# Patient Record
Sex: Female | Born: 1965 | Race: White | Hispanic: No | Marital: Married | Smoking: Never smoker
Health system: Southern US, Community
[De-identification: ages and names within clinical notes are randomized; demographics above are authoritative.]

## PROBLEM LIST (undated history)

## (undated) DIAGNOSIS — E509 Vitamin A deficiency, unspecified: Secondary | ICD-10-CM

## (undated) DIAGNOSIS — D509 Iron deficiency anemia, unspecified: Secondary | ICD-10-CM

## (undated) DIAGNOSIS — F329 Major depressive disorder, single episode, unspecified: Secondary | ICD-10-CM

## (undated) DIAGNOSIS — E669 Obesity, unspecified: Secondary | ICD-10-CM

## (undated) DIAGNOSIS — R519 Headache, unspecified: Secondary | ICD-10-CM

## (undated) DIAGNOSIS — D649 Anemia, unspecified: Secondary | ICD-10-CM

## (undated) DIAGNOSIS — I10 Essential (primary) hypertension: Secondary | ICD-10-CM

## (undated) DIAGNOSIS — Z9884 Bariatric surgery status: Secondary | ICD-10-CM

## (undated) DIAGNOSIS — G8929 Other chronic pain: Secondary | ICD-10-CM

## (undated) DIAGNOSIS — F32A Depression, unspecified: Secondary | ICD-10-CM

## (undated) DIAGNOSIS — E211 Secondary hyperparathyroidism, not elsewhere classified: Secondary | ICD-10-CM

## (undated) DIAGNOSIS — K909 Intestinal malabsorption, unspecified: Secondary | ICD-10-CM

## (undated) DIAGNOSIS — E236 Other disorders of pituitary gland: Secondary | ICD-10-CM

## (undated) HISTORY — DX: Other disorders of pituitary gland: E23.6

## (undated) HISTORY — PX: TUMOR REMOVAL: SHX12

## (undated) HISTORY — PX: ABLATION: SHX5711

## (undated) HISTORY — DX: Other chronic pain: G89.29

## (undated) HISTORY — DX: Iron deficiency anemia, unspecified: D50.9

## (undated) HISTORY — DX: Anemia, unspecified: D64.9

## (undated) HISTORY — DX: Obesity, unspecified: E66.9

## (undated) HISTORY — DX: Essential (primary) hypertension: I10

## (undated) HISTORY — DX: Headache, unspecified: R51.9

## (undated) HISTORY — PX: SHOULDER ARTHROSCOPY: SHX128

## (undated) HISTORY — PX: GASTRIC BYPASS: SHX52

---
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
## 2010-08-31 ENCOUNTER — Ambulatory Visit: Payer: Self-pay | Admitting: Family Medicine

## 2010-08-31 DIAGNOSIS — J069 Acute upper respiratory infection, unspecified: Secondary | ICD-10-CM | POA: Insufficient documentation

## 2010-08-31 DIAGNOSIS — M940 Chondrocostal junction syndrome [Tietze]: Secondary | ICD-10-CM | POA: Insufficient documentation

## 2010-09-02 ENCOUNTER — Telehealth (INDEPENDENT_AMBULATORY_CARE_PROVIDER_SITE_OTHER): Payer: Self-pay | Admitting: *Deleted

## 2010-09-14 ENCOUNTER — Emergency Department (HOSPITAL_COMMUNITY)
Admission: EM | Admit: 2010-09-14 | Discharge: 2010-09-14 | Payer: Self-pay | Source: Home / Self Care | Admitting: Emergency Medicine

## 2010-10-31 NOTE — Progress Notes (Signed)
  Phone Note Outgoing Call Call back at [facility] Phone [phone]   Call placed by: Lajean Saver RN,  September 02, 2010 2:26 PM Call placed to: Patient Summary of Call: Callback, no answer message left with reason for call and call back with any questions

## 2010-10-31 NOTE — Assessment & Plan Note (Signed)
Summary: COLD/TM rm5   Vital Signs:  Patient Profile:   45 Years Old Female CC:      Cold & URI symptoms x 1 week Height:     64 inches Weight:      254 pounds O2 Sat:      98 % O2 treatment:    Room Air Temp:     98.9 degrees F oral Pulse rate:   88 / minute Resp:     16 per minute BP sitting:   153 / 92  (left arm) Cuff size:   large  Pt. in pain?   no  Vitals Entered By: Lajean Saver RN (August 31, 2010 9:58 AM)                   Updated Prior Medication List: ROBITUSSIN MAXIMUM STRENGTH 15 MG/5ML SYRP (DEXTROMETHORPHAN HBR) prn  Current Allergies: ! PCN ! CODEINEHistory of Present Illness Chief Complaint: Cold & URI symptoms x 1 week History of Present Illness:   Subjective: Patient complains of URI symptoms that started 1 one week ago.  Initial symptom was a dry cough.  She sometimes coughs until she gags.  She had a tetanus shot about 3 years ago but does not know if it was a Tdap.  She has had pneumonia in the past.    + sore throat started about 4 to 5 days ago, mild No pleuritic pain, but developed anterior chest discomfort several days ago. No wheezing + nasal congestion + post-nasal drainage No sinus pain/pressure No itchy/red eyes No earache No hemoptysis No SOB No fever/chills No nausea No vomiting No abdominal pain No diarrhea No skin rashes No fatigue No myalgias No headache Used OTC meds without relief   REVIEW OF SYSTEMS Constitutional Symptoms      Denies fever, chills, night sweats, weight loss, weight gain, and fatigue.  Eyes       Denies change in vision, eye pain, eye discharge, glasses, contact lenses, and eye surgery. Ear/Nose/Throat/Mouth       Complains of ear pain, sinus problems, and sore throat.      Denies hearing loss/aids, change in hearing, ear discharge, dizziness, frequent runny nose, frequent nose bleeds, hoarseness, and tooth pain or bleeding.  Respiratory       Complains of dry cough.      Denies productive  cough, wheezing, shortness of breath, asthma, bronchitis, and emphysema/COPD.  Cardiovascular       Denies murmurs, chest pain, and tires easily with exhertion.    Gastrointestinal       Complains of diarrhea.      Denies stomach pain, nausea/vomiting, constipation, blood in bowel movements, and indigestion. Genitourniary       Denies painful urination, kidney stones, and loss of urinary control. Neurological       Complains of headaches.      Denies paralysis, seizures, and fainting/blackouts. Musculoskeletal       Denies muscle pain, joint pain, joint stiffness, decreased range of motion, redness, swelling, muscle weakness, and gout.  Skin       Denies bruising, unusual mles/lumps or sores, and hair/skin or nail changes.  Psych       Denies mood changes, temper/anger issues, anxiety/stress, speech problems, depression, and sleep problems.  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  Extraocular movement is intact.  Conjunctivae are not inflamed.  Ears:  Canals normal.  Tympanic membranes normal.   Nose:  Normal septum.  Normal turbinates, mildly congested.   No sinus tenderness present.  Pharynx:  Minimal erythema Neck:  Supple.  Slightly tender shotty posterior nodes are palpated bilaterally.  Lungs:  Clear to auscultation.  Breath sounds are equal.  Chest:  Distinct tenderness over mid-sternum Heart:  Regular rate and rhythm without murmurs, rubs, or gallops.  Abdomen:  Nontender without masses or hepatosplenomegaly.  Bowel sounds are present.  No CVA or flank tenderness.  Extremities:  No edema.  Skin:  No rash Rapid strep test  negative  Assessment New Problems: COSTOCHONDRITIS, ACUTE (ICD-733.6) UPPER RESPIRATORY INFECTION, ACUTE (ICD-465.9)  NO EVIDENCE BACTERIAL INFECTION TODAY.  SUSPECT VIRAL SYNDROME  Plan New Medications/Changes: BENZONATATE 200 MG CAPS (BENZONATATE) One by mouth hs as needed cough  #12 x 0, 08/31/2010, Donna Christen MD AZITHROMYCIN 250 MG TABS (AZITHROMYCIN) Two tabs by mouth on day 1, then 1 tab daily on days 2 through 5 (Rx void after 09/08/10)  #6 tabs x 0, 08/31/2010, Donna Christen MD  New Orders: Pulse Oximetry (single measurment) [94760] New Patient Level III [09811] Rapid Strep [91478] Planning Comments:   Treat symptomatically for now:  Increase fluid intake, begin expectorant, topical decongestant, cough suppressant at bedtime.  If fever/chills/sweats persist, or if not improving 5 days begin Z-pack (given Rx to hold).  Advised to check with PCP if she had a Tdap; if not, go ahead and start the Z-pack.   May take Ibuprofen for the sternum discomfort.  Given a Water quality scientist patient information and instruction sheet on topic costochondritis Followup with PCP if not improving 7 to 10 days.   The patient and/or caregiver has been counseled thoroughly with regard to medications prescribed including dosage, schedule, interactions, rationale for use, and possible side effects and they verbalize understanding.  Diagnoses and expected course of recovery discussed and will return if not improved as expected or if the condition worsens. Patient and/or caregiver verbalized understanding.  Prescriptions: BENZONATATE 200 MG CAPS (BENZONATATE) One by mouth hs as needed cough  #12 x 0   Entered and Authorized by:   Donna Christen MD   Signed by:   Donna Christen MD on 08/31/2010   Method used:   Print then Give to Patient   RxID:   [id number] AZITHROMYCIN 250 MG TABS (AZITHROMYCIN) Two tabs by mouth on day 1, then 1 tab daily on days 2 through 5 (Rx void after 09/08/10)  #6 tabs x 0   Entered  and Authorized by:   Donna Christen MD   Signed by:   Donna Christen MD on 08/31/2010   Method used:   Print then Give to Patient   RxID:   [id number]   Patient Instructions: 1)  May use Mucinex  (guaifenesin) twice daily for congestion. 2)  Increase fluid intake, rest. 3)  May use Afrin nasal spray (or generic oxymetazoline) twice daily for about 5 days.  Also recommend using saline nasal spray several times daily and/or saline nasal irrigation. 4)  Begin Azithromycin if not improving 5 days or if persistent fever develops 5)  Followup with family doctor if not improving 7 to 10 days   Orders Added: 1)  Pulse Oximetry (single measurment) [94760] 2)  New Patient Level III [  99203] 3)  Rapid Strep [09811]  Appended Document: COLD/TM rm5 Rapid Strep: Negative

## 2012-01-11 ENCOUNTER — Emergency Department
Admission: EM | Admit: 2012-01-11 | Discharge: 2012-01-11 | Disposition: A | Discharge status: Home / Self Care | Payer: BC Managed Care – PPO | Source: Home / Self Care

## 2012-01-11 ENCOUNTER — Emergency Department
Admit: 2012-01-11 | Discharge: 2012-01-11 | Disposition: A | Discharge status: Home / Self Care | Payer: BC Managed Care – PPO

## 2012-01-11 DIAGNOSIS — M79609 Pain in unspecified limb: Secondary | ICD-10-CM

## 2012-01-11 DIAGNOSIS — M7750 Other enthesopathy of unspecified foot: Secondary | ICD-10-CM

## 2012-01-11 HISTORY — DX: Major depressive disorder, single episode, unspecified: F32.9

## 2012-01-11 HISTORY — DX: Depression, unspecified: F32.A

## 2012-01-11 NOTE — ED Provider Notes (Signed)
History     CSN: [id number]  Arrival date & time 01/11/12  1150   First MD Initiated Contact with Patient 01/11/12 1209      Chief Complaint  Patient presents with  . Foot Pain    x 1 week   HPI Comments: Pt states that she noticed signiifcant L dorsal foot pain since 01/05/12. Pt denies any stressful acitivity associated with incident. Pt is not a runner. Pt denies any traumatic nidus for injury. Pt states that pain has been intermittent in nature and associated with walking and bearing weight on forefoot. This has never happened before. Episodes of pain have progressively worsened throughout the week. Pt has also reported intermittent tingling.  NSAIDs have been minimally effective. Pt states that she has been ACE wrapping ankle and this has been effective.   Patient is a 46 y.o. female presenting with lower extremity pain. The history is provided by the patient.  Foot Pain This is a new problem. Episode onset: 1 week ago  Episode frequency: intermittent  The problem has been gradually worsening. Pertinent negatives include no chest pain, no abdominal pain, no headaches and no shortness of breath. The symptoms are aggravated by walking. Relieved by: ace wrapping of the foot. She has tried acetaminophen for the symptoms. The treatment provided no relief.    Past Medical History  Diagnosis Date  . Depression     Past Surgical History  Procedure Date  . Tumor removal     right hand    Family History  Problem Relation Age of Onset  . Hypertension Mother   . Cancer Other     skin    History  Substance Use Topics  . Smoking status: Never Smoker   . Smokeless tobacco: Never Used  . Alcohol Use: No    OB History    Grav Para Term Preterm Abortions TAB SAB Ect Mult Living                  Review of Systems  Respiratory: Negative for shortness of breath.   Cardiovascular: Negative for chest pain.  Gastrointestinal: Negative for abdominal pain.  Neurological: Negative for  headaches.    Allergies  Codeine and Penicillins  Home Medications   Current Outpatient Rx  Name Route Sig Dispense Refill  . SERTRALINE HCL 100 MG PO TABS Oral Take 100 mg by mouth daily.      BP 141/91  Pulse 81  Temp(Src) 98.8 F (37.1 C) (Oral)  Resp 16  Ht 5\' 4"  (1.626 m)  Wt 260 lb (117.935 kg)  BMI 44.63 kg/m2  SpO2 98%  Physical Exam  Constitutional: She appears well-developed and well-nourished.  HENT:  Head: Normocephalic and atraumatic.  Eyes: Conjunctivae are normal. Pupils are equal, round, and reactive to light.  Neck: Normal range of motion.  Cardiovascular: Normal rate and regular rhythm.   Pulmonary/Chest: Effort normal and breath sounds normal.  Musculoskeletal:       Feet:   Ankle: No visible erythema or swelling. Range of motion is full in all directions. Strength is 5/5 in all directions. Stable lateral and medial ligaments; squeeze test and kleiger test unremarkable; Talar dome nontender; No pain at base of 5th MT; No tenderness over cuboid; No tenderness over N spot or navicular prominence No tenderness on posterior aspects of lateral and medial malleolus No sign of peroneal tendon subluxations; Negative tarsal tunnel tinel's Able to walk 4 steps.  ED Course  Procedures (including critical care time)  Labs  Reviewed - No data to display No results found.   No diagnosis found.    MDM  Exam most consistent with morton's neuroma vs. Metatarsalgia. Xrays negative for stress fracture. Conservative management with metatarsal strap. Metatarsagia handout given. [facility] referral and red flags discussed.         Floydene Flock, MD 01/11/12 1330

## 2012-01-11 NOTE — ED Notes (Signed)
Patient complains of left foot pain for 1 week. She states the pain is only when she walks, bends or puts pressure on her foot.

## 2012-01-11 NOTE — Discharge Instructions (Signed)
Patient information: Metatarsalgia (The Basics)View in SpanishWritten by the doctors and editors at UpToDate  What is metatarsalgia? -- Metatarsalgia is a condition that causes pain in the ball of the foot. It happens when there is inflammation in the metatarsals, which are the foot bones closest to the toes (figure 1). Different things can cause metatarsalgia. It can happen in people who run or do other activities that put a lot of pressure on the feet. It can also happen in people who wear tight-fitting shoes a lot or have certain foot problems. What are the symptoms of metatarsalgia? -- Metatarsalgia causes pain in the ball of the foot. The pain can also spread to the toes. The pain is usually worse when people run or do other activities that put pressure on their feet. Will I need tests? -- Probably not. Your doctor or nurse should be able to tell if you have metatarsalgia by learning about your symptoms and doing an exam. He or she might order an X-ray of your foot to make sure your symptoms are not caused by another condition. How is metatarsalgia treated? -- Treatment for metatarsalgia usually involves 1 or more of the following: Resting your foot - Give your foot a chance to heal by resting. But don't completely stop being active. You can do activities that put less pressure on your feet, such as swimming.  Putting ice on your foot when it hurts or after activities that cause pain - You can put a cold gel pack, bag of ice, or bag of frozen vegetables on the painful area every 1 to 2 hours, for 15 minutes each time. Put a thin towel between the ice (or other cold object) and your skin.  Taking a pain-relieving medicine, such as acetaminophen (sample brand name: Tylenol), ibuprofen (sample brand names: Advil, Motrin), or naproxen (sample brand names: Aleve, Naprosyn).  Wearing sturdy shoes and a metatarsal insert - Sneakers with a lot of cushion and good arch and heel support are best. Your  doctor will also probably recommend that you put a padded insert called a "metatarsal pad" into your shoe.  Wearing arch supports or special shoe inserts called "orthotics" that are made to fit your foot In most cases, these treatments help improve symptoms. But if your symptoms don't improve, your doctor might talk with you about other options. This might involve surgery to correct the position of your foot bones. How long does metatarsalgia take to heal? -- Metatarsalgia can take weeks to months to heal, depending on the cause and your symptoms. More on this topic  

## 2012-01-12 NOTE — ED Provider Notes (Signed)
Agree with exam, assessment, and plan.   Lattie Haw, MD 01/12/12 [phone]

## 2012-09-28 ENCOUNTER — Encounter: Payer: Self-pay | Admitting: Emergency Medicine

## 2012-09-28 ENCOUNTER — Emergency Department
Admission: EM | Admit: 2012-09-28 | Discharge: 2012-09-28 | Disposition: A | Discharge status: Home / Self Care | Payer: BC Managed Care – PPO | Source: Home / Self Care | Attending: Family Medicine | Admitting: Family Medicine

## 2012-09-28 DIAGNOSIS — J111 Influenza due to unidentified influenza virus with other respiratory manifestations: Secondary | ICD-10-CM

## 2012-09-28 MED ORDER — OSELTAMIVIR PHOSPHATE 75 MG PO CAPS: 75.0000 mg | ORAL_CAPSULE | Freq: Two times a day (BID) | ORAL | Status: DC

## 2012-09-28 MED ORDER — BENZONATATE 200 MG PO CAPS: 200.0000 mg | ORAL_CAPSULE | Freq: Every day | ORAL | Status: DC

## 2012-09-28 NOTE — ED Provider Notes (Signed)
History     CSN: [id number]  Arrival date & time 09/28/12  7829   First MD Initiated Contact with Patient 09/28/12 0945      Chief Complaint  Patient presents with  . Cough      HPI Comments: Patient complains of onset of flu-like symptoms two days ago with fatigue, mild sore throat, myalgias, headache, fever/chills, mild sinus congestion.  Cough is non-productive and worse at night.  She often coughs until she gags, but recalls that her Tdap is current.  She has not had a flu shot this season.  She states that her BP is normally about 120/80.  The history is provided by the patient.    Past Medical History  Diagnosis Date  . Depression     Past Surgical History  Procedure Date  . Tumor removal     right hand    Family History  Problem Relation Age of Onset  . Hypertension Mother   . Cancer Other     skin    History  Substance Use Topics  . Smoking status: Never Smoker   . Smokeless tobacco: Never Used  . Alcohol Use: No    OB History    Grav Para Term Preterm Abortions TAB SAB Ect Mult Living                  Review of Systems + sore throat + cough No pleuritic pain but has tightness in anterior chest No wheezing + nasal congestion + post-nasal drainage No sinus pain/pressure No itchy/red eyes ? earache No hemoptysis No SOB + fever, + chills + nausea with coughing No vomiting No abdominal pain No diarrhea No urinary symptoms No skin rashes + fatigue + myalgias + headache   Allergies  Codeine and Penicillins  Home Medications   Current Outpatient Rx  Name  Route  Sig  Dispense  Refill  . BENZONATATE 200 MG PO CAPS   Oral   Take 1 capsule (200 mg total) by mouth at bedtime. Take as needed for cough   12 capsule   0   . OSELTAMIVIR PHOSPHATE 75 MG PO CAPS   Oral   Take 1 capsule (75 mg total) by mouth every 12 (twelve) hours.   10 capsule   0   . SERTRALINE HCL 100 MG PO TABS   Oral   Take 100 mg by mouth daily.            BP 162/95  Pulse 109  Temp 100.4 F (38 C) (Oral)  Resp 18  Ht 5\' 4"  (1.626 m)  Wt 262 lb (118.842 kg)  BMI 44.97 kg/m2  SpO2 98%  LMP 09/15/2012  Physical Exam Nursing notes and Vital Signs reviewed. Appearance:  Patient appears stated age, and in no acute distress.  Patient is obese (BMI 44.0) Eyes:  Pupils are equal, round, and reactive to light and accomodation.  Extraocular movement is intact.  Conjunctivae are not inflamed  Ears:  Canals normal.  Tympanic membranes normal.  Nose:  Mildly congested turbinates.  No sinus tenderness.    Pharynx:  Normal Neck:  Supple.  Slightly tender shotty posterior nodes are palpated bilaterally  Lungs:  Clear to auscultation.  Breath sounds are equal. Chest:  Distinct tenderness to palpation over the mid-sternum.   Heart:  Regular rate and rhythm without murmurs, rubs, or gallops.  Abdomen:  Nontender without masses or hepatosplenomegaly.  Bowel sounds are present.  No CVA or flank tenderness.  Extremities:  No  edema.  No calf tenderness Skin:  No rash present.   ED Course  Procedures  none      1. Influenza-like illness   Note elevated BP today      MDM  Begin Tamiflu.  Prescription written for Benzonatate [facility]) to take at bedtime for night-time cough.  Take plain Mucinex (guaifenesin) twice daily for cough and congestion.  Increase fluid intake, rest. May use Afrin nasal spray (or generic oxymetazoline) twice daily for about 5 days.  Also recommend using saline nasal spray several times daily and saline nasal irrigation (AYR is a common brand) Stop all antihistamines for now, and other non-prescription cough/cold preparations. May take Ibuprofen 200mg , 4 tabs every 8 hours with food for chest/sternum discomfort, body aches, etc. Recommend a flu shot when well. Check blood pressure when well. Follow-up with family doctor if not improving 5 to 7 days.         Lattie Haw, MD 09/28/12 1011

## 2012-09-28 NOTE — ED Notes (Signed)
Dry cough and fever x 3 days

## 2013-02-16 ENCOUNTER — Emergency Department
Admission: EM | Admit: 2013-02-16 | Discharge: 2013-02-16 | Disposition: A | Discharge status: Home / Self Care | Payer: BC Managed Care – PPO | Source: Home / Self Care | Attending: Emergency Medicine | Admitting: Emergency Medicine

## 2013-02-16 ENCOUNTER — Encounter: Payer: Self-pay | Admitting: *Deleted

## 2013-02-16 DIAGNOSIS — J029 Acute pharyngitis, unspecified: Secondary | ICD-10-CM

## 2013-02-16 LAB — POCT RAPID STREP A (OFFICE): Rapid Strep A Screen: NEGATIVE

## 2013-02-16 MED ORDER — AZITHROMYCIN 250 MG PO TABS: ORAL_TABLET | ORAL | Status: DC

## 2013-02-16 NOTE — ED Provider Notes (Signed)
History     CSN: [id number]  Arrival date & time 02/16/13  0830   First MD Initiated Contact with Patient 02/16/13 [phone]      Chief Complaint  Patient presents with  . Sore Throat    (Consider location/radiation/quality/duration/timing/severity/associated sxs/prior treatment) HPI SORE THROAT Onset: 2-3 days    Severity: moderate-severe Tried OTC meds without significant relief.  Symptoms:  + Fever , chills, sweats + Swollen neck glands No Recent Strep Exposure     No Myalgias Mild Headache No Rash  No Discolored Nasal Mucus No Allergy symptoms No sinus pain/pressure No itchy/red eyes Mild bilateral earache  No Drooling No Trismus  No Nausea No Vomiting No Abdominal pain No Diarrhea No Reflux symptoms  No Cough No Breathing Difficulty No Shortness of Breath No pleuritic pain No Wheezing No Hemoptysis  Past Medical History  Diagnosis Date  . Depression     Past Surgical History  Procedure Laterality Date  . Tumor removal      right hand    Family History  Problem Relation Age of Onset  . Hypertension Mother   . Cancer Other     skin    History  Substance Use Topics  . Smoking status: Never Smoker   . Smokeless tobacco: Never Used  . Alcohol Use: No    OB History   Grav Para Term Preterm Abortions TAB SAB Ect Mult Living                  Review of Systems  All other systems reviewed and are negative.    Allergies  Codeine and Penicillins  Home Medications   Current Outpatient Rx  Name  Route  Sig  Dispense  Refill  . citalopram (CELEXA) 20 MG tablet   Oral   Take 20 mg by mouth daily.         Marland Kitchen azithromycin (ZITHROMAX Z-PAK) 250 MG tablet      Use as directed   1 each   0     BP 143/93  Pulse 86  Temp(Src) 98 F (36.7 C) (Oral)  Resp 14  Ht 5\' 4"  (1.626 m)  Wt 252 lb (114.306 kg)  BMI 43.23 kg/m2  SpO2 97%  LMP 01/27/2013  Physical Exam  Nursing note and vitals reviewed. Constitutional: She is  oriented to person, place, and time. She appears well-developed and well-nourished. She is cooperative.  Non-toxic appearance. No distress.  HENT:  Head: Normocephalic and atraumatic.  Right Ear: Tympanic membrane, external ear and ear canal normal.  Left Ear: Tympanic membrane, external ear and ear canal normal.  Nose: Nose normal. Right sinus exhibits no maxillary sinus tenderness and no frontal sinus tenderness. Left sinus exhibits no maxillary sinus tenderness and no frontal sinus tenderness.  Mouth/Throat: Mucous membranes are normal. Posterior oropharyngeal erythema present. No oropharyngeal exudate or posterior oropharyngeal edema.  Eyes: Conjunctivae are normal. No scleral icterus.  Neck: Neck supple.  Cardiovascular: Normal rate, regular rhythm and normal heart sounds.   No murmur heard. Pulmonary/Chest: Effort normal and breath sounds normal. No stridor. No respiratory distress. She has no wheezes. She has no rales.  Musculoskeletal: She exhibits no edema.  Lymphadenopathy:    She has cervical adenopathy.       Right cervical: Superficial cervical adenopathy present. No deep cervical and no posterior cervical adenopathy present.      Left cervical: Superficial cervical adenopathy present. No deep cervical and no posterior cervical adenopathy present.  Neurological: She is alert and  oriented to person, place, and time.  Skin: Skin is warm and dry.  Psychiatric: She has a normal mood and affect.    ED Course  Procedures (including critical care time)  Labs Reviewed  POCT RAPID STREP A (OFFICE)   No results found.   1. Acute pharyngitis       MDM  Rapid strep test negative. We discussed treatment options and because of severity of symptoms and after risks, benefits, alternatives discussed, patient prefers to treat aggressively with antibiotic as Zithromax has worked great and the past for similar symptoms. Zithromax prescribed. Other symptomatic care discussed, followup  with PCP when necessary.        Lajean Manes, MD 02/16/13 [phone]

## 2013-02-16 NOTE — ED Notes (Signed)
Meagan Floyd c/o sore throat, chills, HA and ear pain x 2 days.

## 2013-10-08 ENCOUNTER — Encounter: Payer: Self-pay | Admitting: Emergency Medicine

## 2013-10-08 ENCOUNTER — Emergency Department
Admission: EM | Admit: 2013-10-08 | Discharge: 2013-10-08 | Disposition: A | Discharge status: Home / Self Care | Payer: BC Managed Care – PPO | Source: Home / Self Care | Attending: Emergency Medicine | Admitting: Emergency Medicine

## 2013-10-08 DIAGNOSIS — H6691 Otitis media, unspecified, right ear: Secondary | ICD-10-CM

## 2013-10-08 DIAGNOSIS — H6690 Otitis media, unspecified, unspecified ear: Secondary | ICD-10-CM

## 2013-10-08 MED ORDER — CETIRIZINE-PSEUDOEPHEDRINE ER 5-120 MG PO TB12: 1.0000 | ORAL_TABLET | Freq: Every day | ORAL | Status: DC

## 2013-10-08 MED ORDER — AZITHROMYCIN 250 MG PO TABS: ORAL_TABLET | ORAL | Status: DC

## 2013-10-08 MED ORDER — CICLESONIDE 50 MCG/ACT NA SUSP: 2.0000 | Freq: Every day | NASAL | Status: DC

## 2013-10-08 NOTE — ED Notes (Signed)
Reports right ear pain intermittently x 3 months with 2 courses of treatment with Flonase. Making sleep difficult since pain worse at bed time.

## 2013-10-08 NOTE — ED Provider Notes (Signed)
CSN: [id number]     Arrival date & time 10/08/13  1708 History   First MD Initiated Contact with Patient 10/08/13 1716     Chief Complaint  Patient presents with  . Otalgia   (Consider location/radiation/quality/duration/timing/severity/associated sxs/prior Treatment) HPI Onset in October. Right ear pain. Saw PCP who diagnosed fluid behind the ear drum and prescribed Flonase. No antibiotic prescribed. It has waxed and waned, now much worse the past 2 days . She describes it as a pressure type pain, 3 or 4/10 in intensity.  No chills/sweats Possible low-grade Fever, but not documented  +  Mild Nasal congestion No Discolored Post-nasal drainage No sinus pain/pressure No sore throat  No cough No wheezing No chest congestion No hemoptysis No shortness of breath No pleuritic pain  No itchy/red eyes No earache  No nausea No vomiting No abdominal pain No diarrhea  No skin rashes +  Fatigue No myalgias No headache  Past Medical History  Diagnosis Date  . Depression   . Depression    Past Surgical History  Procedure Laterality Date  . Tumor removal      right hand   Family History  Problem Relation Age of Onset  . Hypertension Mother   . Cancer Other     skin  . Hypertension Father    History  Substance Use Topics  . Smoking status: Never Smoker   . Smokeless tobacco: Never Used  . Alcohol Use: No   OB History   Grav Para Term Preterm Abortions TAB SAB Ect Mult Living                 Review of Systems  All other systems reviewed and are negative.    Allergies  Codeine and Penicillins  Home Medications   Current Outpatient Rx  Name  Route  Sig  Dispense  Refill  . Lorcaserin HCl (BELVIQ) 10 MG TABS   Oral   Take by mouth.         Marland Kitchen azithromycin (ZITHROMAX Z-PAK) 250 MG tablet      Use as directed   1 each   0   . azithromycin (ZITHROMAX Z-PAK) 250 MG tablet      Take 2 tablets on day one, then 1 tablet daily on days 2 through 5   1  each   0   . cetirizine-pseudoephedrine (ZYRTEC-D) 5-120 MG per tablet   Oral   Take 1 tablet by mouth daily. Take in the morning   15 tablet   0   . ciclesonide (OMNARIS) 50 MCG/ACT nasal spray   Each Nare   Place 2 sprays into both nostrils daily.   12.5 g   0   . citalopram (CELEXA) 20 MG tablet   Oral   Take 20 mg by mouth daily.          BP 155/91  Pulse 87  Temp(Src) 98.5 F (36.9 C) (Oral)  Resp 16  Ht 5\' 4"  (1.626 m)  Wt 245 lb (111.131 kg)  BMI 42.03 kg/m2  SpO2 98% Physical Exam  Nursing note and vitals reviewed. Constitutional: She is oriented to person, place, and time. She appears well-developed and well-nourished. No distress.  HENT:  Head: Normocephalic and atraumatic.  Right Ear: External ear and ear canal normal. Tympanic membrane is injected and erythematous.  Left Ear: Tympanic membrane, external ear and ear canal normal.  Nose: Mucosal edema and rhinorrhea present.  Mouth/Throat: Oropharynx is clear and moist. No oral lesions.  Eyes: Conjunctivae  are normal. No scleral icterus.  Neck: Neck supple.  Cardiovascular: Normal rate, regular rhythm and normal heart sounds.   Pulmonary/Chest: Effort normal and breath sounds normal.  Lymphadenopathy:    She has no cervical adenopathy.  Neurological: She is alert and oriented to person, place, and time.  Skin: Skin is warm and dry.    ED Course  Procedures (including critical care time) Labs Review Labs Reviewed - No data to display Imaging Review No results found.  EKG Interpretation    Date/Time:    Ventricular Rate:    PR Interval:    QRS Duration:   QT Interval:    QTC Calculation:   R Axis:     Text Interpretation:              MDM   1. Acute right otitis media    Treatment options discussed, as well as risks, benefits, alternatives. Patient voiced understanding and agreement with the following plans: (She is allergic to penicillin, which caused severe rash and  itch) Zithromax Z-PAK prescribed, as she states she has done well with this in the past without side effects. (She declined other antibiotic options) Other prescriptions: Flonase. Zyrtec-D. Omnaris. Other symptomatic care discussed. Followup with PCP or ENT if no better one week, sooner if worse or new symptoms Precautions discussed. Red flags discussed. Questions invited and answered. Patient voiced understanding and agreement.      Lajean Manesavid Massey, MD 10/08/13 [phone]

## 2013-11-11 DIAGNOSIS — M25512 Pain in left shoulder: Secondary | ICD-10-CM | POA: Insufficient documentation

## 2014-07-18 DIAGNOSIS — F54 Psychological and behavioral factors associated with disorders or diseases classified elsewhere: Secondary | ICD-10-CM

## 2015-03-13 DIAGNOSIS — K912 Postsurgical malabsorption, not elsewhere classified: Secondary | ICD-10-CM | POA: Insufficient documentation

## 2015-03-13 DIAGNOSIS — Z903 Acquired absence of stomach [part of]: Secondary | ICD-10-CM

## 2015-06-06 ENCOUNTER — Emergency Department
Admission: EM | Admit: 2015-06-06 | Discharge: 2015-06-06 | Disposition: A | Discharge status: Home / Self Care | Payer: 59 | Source: Home / Self Care | Attending: Family Medicine | Admitting: Family Medicine

## 2015-06-06 ENCOUNTER — Encounter: Payer: Self-pay | Admitting: *Deleted

## 2015-06-06 DIAGNOSIS — H6983 Other specified disorders of Eustachian tube, bilateral: Secondary | ICD-10-CM

## 2015-06-06 DIAGNOSIS — R0981 Nasal congestion: Secondary | ICD-10-CM

## 2015-06-06 MED ORDER — PREDNISONE 20 MG PO TABS: 20.0000 mg | ORAL_TABLET | Freq: Two times a day (BID) | ORAL | Status: DC

## 2015-06-06 MED ORDER — AZITHROMYCIN 250 MG PO TABS: ORAL_TABLET | ORAL | Status: DC

## 2015-06-06 NOTE — Discharge Instructions (Signed)
Begin Pseudoephedrine (30mg, one or two every 4 to 6 hours) for sinus congestion.    °May use Afrin nasal spray (or generic oxymetazoline) twice daily for about 5 days and then discontinue.  Also recommend using saline nasal spray several times daily and saline nasal irrigation (AYR is a common brand).  Use Flonase nasal spray each morning after using Afrin nasal spray and saline nasal irrigation. °Stop all antihistamines for now, and other non-prescription cough/cold preparations. °  °

## 2015-06-06 NOTE — ED Notes (Signed)
Pt c/o sinus pain, RT ear pain and dental pain x 1 wk. Denies fever. Reports some nasal congestion. Taking Tylenol.

## 2015-06-06 NOTE — ED Provider Notes (Signed)
CSN: [id number]     Arrival date & time 06/06/15  0809 History   First MD Initiated Contact with Patient 06/06/15 [phone]     Chief Complaint  Patient presents with  . Otalgia  . Facial Pain      HPI Comments: Patient complains of approximately 1.5 week history of sinus pain and pressure, initially in her maxillary areas, and now her frontal area.  Her right ear has felt full, and today she has a sensation of crinkling plastic wrap in her right ear.  No fevers, chills, and sweats.  No sore throat or other URI symptoms. She has had no response in the past to [facility].  She is presently taking only Tylenol for discomfort.  The history is provided by the patient.    Past Medical History  Diagnosis Date  . Depression   . Depression    Past Surgical History  Procedure Laterality Date  . Tumor removal      right hand  . Shoulder arthroscopy    . Gastric bypass     Family History  Problem Relation Age of Onset  . Hypertension Mother   . Cancer Other     skin  . Hypertension Father    Social History  Substance Use Topics  . Smoking status: Never Smoker   . Smokeless tobacco: Never Used  . Alcohol Use: No   OB History    No data available     Review of Systems No sore throat No cough No pleuritic pain No wheezing + nasal congestion + post-nasal drainage ? sinus pain/pressure No itchy/red eyes + right earache No hemoptysis No SOB No fever/chills No nausea No vomiting No abdominal pain No diarrhea No urinary symptoms No skin rash No fatigue No myalgias + headache Used OTC meds without relief  Allergies  Codeine and Penicillins  Home Medications   Prior to Admission medications   Medication Sig Start Date End Date Taking? Authorizing Provider  Calcium Citrate-Vitamin D (CALCIUM + D PO) Take by mouth.   Yes Historical Provider, MD  Cholecalciferol (VITAMIN D) 2000 UNITS CAPS Take by mouth.   Yes Historical Provider, MD  MULTIPLE VITAMIN PO Take by mouth.   Yes  Historical Provider, MD  azithromycin (ZITHROMAX Z-PAK) 250 MG tablet Take 2 tabs today; then begin one tab once daily for 4 more days. 06/06/15   Lattie Haw, MD  citalopram (CELEXA) 20 MG tablet Take 20 mg by mouth daily.    Historical Provider, MD  Lorcaserin HCl (BELVIQ) 10 MG TABS Take by mouth.    Historical Provider, MD  predniSONE (DELTASONE) 20 MG tablet Take 1 tablet (20 mg total) by mouth 2 (two) times daily. Take with food. 06/06/15   Lattie Haw, MD   Meds Ordered and Administered this Visit  Medications - No data to display  BP 130/85 mmHg  Pulse 66  Temp(Src) 98.3 F (36.8 C) (Oral)  Resp 16  Ht  (1.626 m)  Wt 183 lb (83.008 kg)  BMI 31.40 kg/m2  SpO2 98% No data found.   Physical Exam Nursing notes and Vital Signs reviewed. Appearance:  Patient appears stated age, and in no acute distress Eyes:  Pupils are equal, round, and reactive to light and accomodation.  Extraocular movement is intact.  Conjunctivae are not inflamed  Ears:  Canals normal.  Tympanic membranes somewhat sclerotic but otherwise normal. Nose:  Congested turbinates, more prominent on the right.  No sinus tenderness.  Pharynx:  Normal  Neck:  Supple.  No adenoapthy. Skin:  No rash present.   ED Course  Procedures  None  Labs Reviewed -   Tympanogram:  Low peak height both ears     MDM   1. Eustachian tube dysfunction, bilateral   2. Nasal sinus congestion    Begin prednisone burst, and empiric Z-pack Begin Pseudoephedrine (30mg , one or two every 4 to 6 hours) for sinus congestion.   May use Afrin nasal spray (or generic oxymetazoline) twice daily for about 5 days and then discontinue.  Also recommend using saline nasal spray several times daily and saline nasal irrigation (AYR is a common brand).  Use Flonase nasal spray each morning after using Afrin nasal spray and saline nasal irrigation.  Stop all antihistamines for now, and other non-prescription cough/cold  preparations. Followup with ENT if not improved 10 to 14 days.    Lattie Haw, MD 06/06/15 [phone]

## 2015-06-10 ENCOUNTER — Telehealth: Payer: Self-pay

## 2015-08-29 DIAGNOSIS — E669 Obesity, unspecified: Secondary | ICD-10-CM | POA: Insufficient documentation

## 2015-11-21 ENCOUNTER — Emergency Department
Admission: EM | Admit: 2015-11-21 | Discharge: 2015-11-21 | Disposition: A | Discharge status: Home / Self Care | Payer: BLUE CROSS/BLUE SHIELD | Source: Home / Self Care | Attending: Family Medicine | Admitting: Family Medicine

## 2015-11-21 ENCOUNTER — Encounter: Payer: Self-pay | Admitting: *Deleted

## 2015-11-21 DIAGNOSIS — H6981 Other specified disorders of Eustachian tube, right ear: Secondary | ICD-10-CM | POA: Diagnosis not present

## 2015-11-21 DIAGNOSIS — R300 Dysuria: Secondary | ICD-10-CM

## 2015-11-21 LAB — POCT URINALYSIS DIP (MANUAL ENTRY)
BILIRUBIN UA: NEGATIVE
Bilirubin, UA: NEGATIVE
Glucose, UA: NEGATIVE
Leukocytes, UA: NEGATIVE
Nitrite, UA: NEGATIVE
PH UA: 5.5 (ref 5–8)
PROTEIN UA: NEGATIVE
RBC UA: NEGATIVE
UROBILINOGEN UA: 0.2 (ref 0–1)

## 2015-11-21 MED ORDER — SULFAMETHOXAZOLE-TRIMETHOPRIM 800-160 MG PO TABS: 1.0000 | ORAL_TABLET | Freq: Two times a day (BID) | ORAL | Status: DC

## 2015-11-21 MED ORDER — PHENAZOPYRIDINE HCL 200 MG PO TABS: 200.0000 mg | ORAL_TABLET | Freq: Three times a day (TID) | ORAL | Status: DC

## 2015-11-21 NOTE — Discharge Instructions (Signed)
Increase fluid intake. May add Pseudoephedrine ( , one or two every 4 to 6 hours) for sinus congestion.   May use Afrin nasal spray (or generic oxymetazoline) twice daily for about 5 days and then discontinue.

## 2015-11-21 NOTE — ED Notes (Signed)
Pt c/o dysuria and chills x 2 days. She was treated for UTI at her PCP in 1/17', ABT unknown. She was treated again through an E-visit on 11/13/15 with Bactrim.

## 2015-11-21 NOTE — ED Provider Notes (Signed)
CSN: [id number]     Arrival date & time 11/21/15  1808 History   First MD Initiated Contact with Patient 11/21/15 1859     Chief Complaint  Patient presents with  . Dysuria      HPI Comments: Patient presents with two complaints: 1)  She has had dysuria and frequency for 2 days.  She has had chills and vague back discomfort.  She was treated for a UTI 1.5 months ago with improvement.  She developed recurrent symptoms about 8 days ago and was treated through an e-visit with Bactrim DS for 3 days.  She improved but her symptoms recurred.  2)  She complains of a sensation of "crackling" in her right ear, and has difficulty equalizing pressure in her ears.  No earache.  She had frequent otitis media as a child.  The history is provided by the patient.    Past Medical History  Diagnosis Date  . Depression   . Depression    Past Surgical History  Procedure Laterality Date  . Tumor removal      right hand  . Shoulder arthroscopy    . Gastric bypass     Family History  Problem Relation Age of Onset  . Hypertension Mother   . Cancer Other     skin  . Hypertension Father    Social History  Substance Use Topics  . Smoking status: Never Smoker   . Smokeless tobacco: Never Used  . Alcohol Use: No   OB History    No data available     Review of Systems No sore throat No cough No pleuritic pain No wheezing No nasal congestion ? post-nasal drainage No sinus pain/pressure No itchy/red eyes ? Earache; right ear feels clogged No hemoptysis No SOB No fever/chills No nausea No vomiting No abdominal pain No diarrhea + dysuria/frequency No skin rash No fatigue No myalgias No headache Used OTC meds without relief  Allergies  Codeine and Penicillins  Home Medications   Prior to Admission medications   Medication Sig Start Date End Date Taking? Authorizing Provider  Calcium Citrate-Vitamin D (CALCIUM + D PO) Take by mouth.    Historical Provider, MD  Cholecalciferol  (VITAMIN D) 2000 UNITS CAPS Take by mouth.    Historical Provider, MD  citalopram (CELEXA) 20 MG tablet Take 20 mg by mouth daily.    Historical Provider, MD  Lorcaserin HCl (BELVIQ) 10 MG TABS Take by mouth.    Historical Provider, MD  MULTIPLE VITAMIN PO Take by mouth.    Historical Provider, MD  phenazopyridine (PYRIDIUM) 200 MG tablet Take 1 tablet (200 mg total) by mouth 3 (three) times daily. Take after meals. 11/21/15   Lattie Haw, MD  sulfamethoxazole-trimethoprim (BACTRIM DS,SEPTRA DS) 800-160 MG tablet Take 1 tablet by mouth 2 (two) times daily. 11/21/15   Lattie Haw, MD   Meds Ordered and Administered this Visit  Medications - No data to display  BP 124/78 mmHg  Pulse 78  Temp(Src) 98 F (36.7 C) (Oral)  Resp 16  Ht  (1.626 m)  Wt 182 lb (82.555 kg)  BMI 31.22 kg/m2  SpO2 100% No data found.   Physical Exam Nursing notes and Vital Signs reviewed. Appearance:  Patient appears stated age, and in no acute distress Eyes:  Pupils are equal, round, and reactive to light and accomodation.  Extraocular movement is intact.  Conjunctivae are not inflamed  Ears:  Canals normal.  Tympanic membranes normal.  No temporomandibular joint  tenderness. Nose:  Mildly congested turbinates.  No sinus tenderness.   Pharynx:  Normal Neck:  Supple.  No adenopathy Lungs:  Clear to auscultation.  Breath sounds are equal.  Moving air well. Heart:  Regular rate and rhythm without murmurs, rubs, or gallops.  Abdomen:  Nontender without masses or hepatosplenomegaly.  Bowel sounds are present.  No CVA or flank tenderness.  Extremities:  No edema.  Skin:  No rash present.   ED Course  Procedures none    Labs Reviewed  URINE CULTURE  POCT URINALYSIS DIP (MANUAL ENTRY) negative   Tympanogram:  Left ear normal; Right ear low peak height.    MDM   1. Dysuria   2. Eustachian tube dysfunction, right; ?otitis media    Urine culture pending.  Begin Bactrim DS BID for one week (to  cover both UTI and possible otitis media).  Rx for Pyridium.  Increase fluid intake. May add Pseudoephedrine ( , one or two every 4 to 6 hours) for sinus congestion.   May use Afrin nasal spray (or generic oxymetazoline) twice daily for about 5 days and then discontinue.   Followup with urologist if urine symptoms persist.    Lattie Haw, MD 11/21/15 [phone]

## 2015-11-23 LAB — URINE CULTURE
Colony Count: NO GROWTH
Organism ID, Bacteria: NO GROWTH

## 2015-11-24 ENCOUNTER — Telehealth: Payer: Self-pay | Admitting: Emergency Medicine

## 2016-01-14 LAB — HM PAP SMEAR: HM Pap smear: NEGATIVE

## 2016-04-24 ENCOUNTER — Encounter: Payer: Self-pay | Admitting: Emergency Medicine

## 2016-04-24 ENCOUNTER — Emergency Department (INDEPENDENT_AMBULATORY_CARE_PROVIDER_SITE_OTHER): Payer: BLUE CROSS/BLUE SHIELD

## 2016-04-24 ENCOUNTER — Emergency Department
Admission: EM | Admit: 2016-04-24 | Discharge: 2016-04-24 | Payer: BLUE CROSS/BLUE SHIELD | Source: Home / Self Care | Attending: Family Medicine | Admitting: Family Medicine

## 2016-04-24 DIAGNOSIS — R0789 Other chest pain: Secondary | ICD-10-CM

## 2016-04-24 DIAGNOSIS — R079 Chest pain, unspecified: Secondary | ICD-10-CM

## 2016-04-24 DIAGNOSIS — M545 Low back pain, unspecified: Secondary | ICD-10-CM

## 2016-04-24 DIAGNOSIS — R51 Headache: Secondary | ICD-10-CM | POA: Diagnosis not present

## 2016-04-24 DIAGNOSIS — R110 Nausea: Secondary | ICD-10-CM

## 2016-04-24 DIAGNOSIS — R509 Fever, unspecified: Secondary | ICD-10-CM | POA: Diagnosis not present

## 2016-04-24 DIAGNOSIS — R05 Cough: Secondary | ICD-10-CM

## 2016-04-24 LAB — POCT URINALYSIS DIP (MANUAL ENTRY)
Bilirubin, UA: NEGATIVE
Blood, UA: NEGATIVE
Glucose, UA: NEGATIVE
Ketones, POC UA: NEGATIVE
LEUKOCYTES UA: NEGATIVE
NITRITE UA: NEGATIVE
PROTEIN UA: NEGATIVE
Spec Grav, UA: 1.02 (ref 1.005–1.03)
UROBILINOGEN UA: 0.2 (ref 0–1)
pH, UA: 7.5 (ref 5–8)

## 2016-04-24 NOTE — ED Provider Notes (Addendum)
[facility]    CSN: [id number] Arrival date & time: 04/24/16  1729  First Provider Contact:  First MD Initiated Contact with Patient 04/24/16 1823        History   Chief Complaint Chief Complaint  Patient presents with  . Chest Pain    HPI Meagan Floyd is a 50 y.o. female.   Yesterday patient developed onset of fatigue, chest pressure, lower right back pain, non-productive cough, fever to 102, nausea without vomiting, and tingling in her fingers.  No GU symptoms.  She has a past history of pneumonia.   She has a history of interstitial cystitis and underwent hydrodistension 9 days ago.        Past Medical History:  Diagnosis Date  . Depression   . Depression     Patient Active Problem List   Diagnosis Date Noted  . UPPER RESPIRATORY INFECTION, ACUTE 08/31/2010  . COSTOCHONDRITIS, ACUTE 08/31/2010    Past Surgical History:  Procedure Laterality Date  . GASTRIC BYPASS    . SHOULDER ARTHROSCOPY    . TUMOR REMOVAL     right hand    OB History    No data available       Home Medications    Prior to Admission medications   Medication Sig Start Date End Date Taking? Authorizing Provider  Calcium Citrate-Vitamin D (CALCIUM + D PO) Take by mouth.    Historical Provider, MD  Cholecalciferol (VITAMIN D) 2000 UNITS CAPS Take by mouth.    Historical Provider, MD  citalopram (CELEXA) 20 MG tablet Take 20 mg by mouth daily.    Historical Provider, MD  Lorcaserin HCl (BELVIQ) 10 MG TABS Take by mouth.    Historical Provider, MD  MULTIPLE VITAMIN PO Take by mouth.    Historical Provider, MD  phenazopyridine (PYRIDIUM) 200 MG tablet Take 1 tablet (200 mg total) by mouth 3 (three) times daily. Take after meals. 11/21/15   Lattie Haw, MD  sulfamethoxazole-trimethoprim (BACTRIM DS,SEPTRA DS) 800-160 MG tablet Take 1 tablet by mouth 2 (two) times daily. 11/21/15   Lattie Haw, MD    Family History Family History  Problem Relation Age of Onset  .  Hypertension Mother   . Cancer Other     skin  . Hypertension Father     Social History Social History  Substance Use Topics  . Smoking status: Never Smoker  . Smokeless tobacco: Never Used  . Alcohol use No     Allergies   Codeine and Penicillins   Review of Systems Review of Systems  Constitutional: Positive for activity change, appetite change, chills, diaphoresis, fatigue and fever.  Eyes: Negative.   Respiratory: Positive for chest tightness. Negative for cough, shortness of breath, wheezing and stridor.   Cardiovascular: Positive for chest pain. Negative for palpitations and leg swelling.  Gastrointestinal: Positive for nausea. Negative for vomiting.  Genitourinary: Negative.   Musculoskeletal: Positive for myalgias.       Low back ache  Skin: Negative.   Neurological: Positive for headaches.     Physical Exam Triage Vital Signs ED Triage Vitals  Enc Vitals Group     BP 04/24/16 1801 114/78     Pulse Rate 04/24/16 1801 102     Resp --      Temp 04/24/16 1801 99.9 F (37.7 C)     Temp Source 04/24/16 1801 Oral     SpO2 04/24/16 1801 97 %     Weight 04/24/16 1801 192 lb (87.1 kg)  Height 04/24/16 1801 5\' 4"  (1.626 m)     Head Circumference --      Peak Flow --      Pain Score 04/24/16 1802 8     Pain Loc --      Pain Edu? --      Excl. in GC? --    No data found.   Updated Vital Signs BP 114/78 (BP Location: Left Arm)   Pulse 102   Temp 99.9 F (37.7 C) (Oral)   Ht 5\' 4"  (1.626 m)   Wt 192 lb (87.1 kg)   SpO2 97%   BMI 32.96 kg/m   Visual Acuity Right Eye Distance:   Left Eye Distance:   Bilateral Distance:    Right Eye Near:   Left Eye Near:    Bilateral Near:     Physical Exam  Constitutional: She is oriented to person, place, and time. She appears well-developed and well-nourished. No distress.  HENT:  Head: Normocephalic.  Right Ear: External ear normal.  Left Ear: External ear normal.  Nose: Nose normal.  Mouth/Throat:  Oropharynx is clear and moist.  Eyes: Conjunctivae and EOM are normal. Pupils are equal, round, and reactive to light.  Neck: Neck supple. No thyromegaly present.  Cardiovascular: Regular rhythm, normal heart sounds and intact distal pulses.   Heart rate 112  Pulmonary/Chest: Breath sounds normal.        There is lateral chest tenderness to palpation as noted on diagram.   Abdominal: There is tenderness.    Vague lower abdominal tenderness to palpation as noted on diagram.   Musculoskeletal: She exhibits no edema.  Lymphadenopathy:    She has no cervical adenopathy.  Neurological: She is alert and oriented to person, place, and time. She displays normal reflexes. No cranial nerve deficit. She exhibits normal muscle tone. Coordination normal.  Skin: No rash noted.  Nursing note and vitals reviewed.    UC Treatments / Results  Labs (all labs ordered are listed, but only abnormal results are displayed) Labs Reviewed  POCT URINALYSIS DIP (MANUAL ENTRY) negative    EKG Rate:  103 BPM PR:  154 msec QT:  310 msec QTcH:  385 msec QRSD:  94 msec QRS axis:  -9 degrees Interpretation: Sinus tachycardia; non-specific T wave changes   Radiology No results found.  Procedures Procedures (including critical care time)  Medications Ordered in UC Medications - No data to display   Initial Impression / Assessment and Plan / UC Course  I have reviewed the triage vital signs and the nursing notes.  Pertinent labs & imaging results that were available during my care of the patient were reviewed by me and considered in my medical decision making (see chart for details).  Clinical Course       Final Clinical Impressions(s) / UC Diagnoses   Final diagnoses:  Chest pain of uncertain etiology  Right-sided low back pain without sciatica   Concern for possible pulmonary embolus.  Advised patent to proceed to [facility] Emergency Department for immediate  evaluation.  Patient's vital signs stable and she is safe to be transported by her husband in private vehicle.  New Prescriptions Discharge Medication List as of 04/24/2016  7:48 PM       Lattie Haw, MD 05/22/16 2313    Lattie Haw, MD 05/22/16 [phone]

## 2016-04-24 NOTE — ED Triage Notes (Signed)
Yesterday chest pain, pressure, lower right back pain left arm pain, cough, fever 102, slight  nausea tingling in fingers

## 2016-12-24 ENCOUNTER — Encounter (INDEPENDENT_AMBULATORY_CARE_PROVIDER_SITE_OTHER): Payer: Self-pay | Admitting: Orthopedic Surgery

## 2016-12-24 ENCOUNTER — Ambulatory Visit (INDEPENDENT_AMBULATORY_CARE_PROVIDER_SITE_OTHER): Payer: BLUE CROSS/BLUE SHIELD

## 2016-12-24 ENCOUNTER — Ambulatory Visit (INDEPENDENT_AMBULATORY_CARE_PROVIDER_SITE_OTHER): Payer: BLUE CROSS/BLUE SHIELD | Admitting: Orthopedic Surgery

## 2016-12-24 DIAGNOSIS — M25511 Pain in right shoulder: Secondary | ICD-10-CM

## 2016-12-24 NOTE — Progress Notes (Signed)
Office Visit Note   Patient: Meagan GreathouseMichele Soffer           Date of Birth: Jun 06, 1966           MRN: [id number] Visit Date: 12/24/2016 Requested by: No referring provider defined for this encounter. PCP: No PCP Per Patient  Subjective: Chief Complaint  Patient presents with  . Right Shoulder - Pain    HPI: Meagan JesterMichele is a 51 year old patient with 6 month history of atraumatic onset right shoulder pain.  She reports generally constant pain which she localizes in the anterior deltoid region on the right-hand side.  She feels like it is a constant bee sting in this area.  The pain will occasionally radiate down to her hand as a dull pain not sharp.  She denies any neck pain.  The pain will wake her up from sleep at night.  She does computer type work.  It is difficult for her to put her bra on.  She takes Tylenol for pain.  She is had gastric bypass surgery and cannot take anti-inflammatories.              ROS: All systems reviewed are negative as they relate to the chief complaint within the history of present illness.  Patient denies  fevers or chills.   Assessment & Plan: Visit Diagnoses:  1. Right shoulder pain, unspecified chronicity     Plan: Impression is right shoulder pain with frozen shoulder.  Rotator cuff strength is intact and radiographs are normal.  I discussed with her at length the tear to approach to intervention and treatment for this problem.  I think in general we'll start with home exercise program.  If she is no better in 6 weeks then consideration for formal physical therapy and glenohumeral injection will be made.  I'll see her back at that time if she wants to proceed further.  I did caution her that the worst thing she can do was to keep the shoulder motion was.  Moving it even though it hurts is important in order to restore range of motion and more normal function.  Follow-Up Instructions: No Follow-up on file.   Orders:  Orders Placed This Encounter  Procedures  .  XR Shoulder Right   No orders of the defined types were placed in this encounter.     Procedures: No procedures performed   Clinical Data: No additional findings.  Objective: Vital Signs: There were no vitals taken for this visit.  Physical Exam:   Constitutional: Patient appears well-developed HEENT:  Head: Normocephalic Eyes:EOM are normal Neck: Normal range of motion Cardiovascular: Normal rate Pulmonary/chest: Effort normal Neurologic: Patient is alert Skin: Skin is warm Psychiatric: Patient has normal mood and affect    Ortho Exam: Orthopedic exam demonstrates full active and passive range of motion of the left shoulder.  On the right-hand side she has passive forward flexion only to about 120 passive glenohumeral abduction to about 60 isolated external rotation at 15 abduction is about 25 on the right compared to 50 on the left rotator cuff strength is intact.  No masses lymph adenopathy or skin changes noted in the right shoulder girdle region.  Neck range of motion is full.  No paresthesias C5 T1.  Reflexes 2+ out of 4 bilateral biceps and triceps  Specialty Comments:  No specialty comments available.  Imaging: Xr Shoulder Right  Result Date: 12/24/2016 Right shoulder AP lateral and outlet views reviewed. Lung fields clear.  Shoulder is reduced.  Acromioclavicular  joint intact.  No soft tissue calcifications present around the joint.  Shoulder joint otherwise normal    PMFS History: Patient Active Problem List   Diagnosis Date Noted  . UPPER RESPIRATORY INFECTION, ACUTE 08/31/2010  . COSTOCHONDRITIS, ACUTE 08/31/2010   Past Medical History:  Diagnosis Date  . Depression   . Depression     Family History  Problem Relation Age of Onset  . Hypertension Mother   . Cancer Other     skin  . Hypertension Father     Past Surgical History:  Procedure Laterality Date  . GASTRIC BYPASS    . SHOULDER ARTHROSCOPY    . TUMOR REMOVAL     right hand    Social History   Occupational History  . Not on file.   Social History Main Topics  . Smoking status: Never Smoker  . Smokeless tobacco: Never Used  . Alcohol use No  . Drug use: No  . Sexual activity: Not on file

## 2017-02-04 ENCOUNTER — Ambulatory Visit (INDEPENDENT_AMBULATORY_CARE_PROVIDER_SITE_OTHER): Payer: BLUE CROSS/BLUE SHIELD | Admitting: Orthopedic Surgery

## 2017-08-10 ENCOUNTER — Encounter: Payer: Self-pay | Admitting: Physician Assistant

## 2017-08-10 ENCOUNTER — Ambulatory Visit (INDEPENDENT_AMBULATORY_CARE_PROVIDER_SITE_OTHER): Payer: BLUE CROSS/BLUE SHIELD | Admitting: Physician Assistant

## 2017-08-10 VITALS — BP 139/90 | HR 78 | Wt 217.0 lb

## 2017-08-10 DIAGNOSIS — R4789 Other speech disturbances: Secondary | ICD-10-CM

## 2017-08-10 DIAGNOSIS — F5105 Insomnia due to other mental disorder: Secondary | ICD-10-CM | POA: Diagnosis not present

## 2017-08-10 DIAGNOSIS — R413 Other amnesia: Secondary | ICD-10-CM | POA: Diagnosis not present

## 2017-08-10 DIAGNOSIS — F99 Mental disorder, not otherwise specified: Secondary | ICD-10-CM | POA: Diagnosis not present

## 2017-08-10 DIAGNOSIS — Z1231 Encounter for screening mammogram for malignant neoplasm of breast: Secondary | ICD-10-CM

## 2017-08-10 DIAGNOSIS — F331 Major depressive disorder, recurrent, moderate: Secondary | ICD-10-CM | POA: Insufficient documentation

## 2017-08-10 DIAGNOSIS — Z7689 Persons encountering health services in other specified circumstances: Secondary | ICD-10-CM | POA: Diagnosis not present

## 2017-08-10 DIAGNOSIS — R700 Elevated erythrocyte sedimentation rate: Secondary | ICD-10-CM | POA: Diagnosis not present

## 2017-08-10 MED ORDER — SUVOREXANT 10 MG PO TABS: 10.0000 mg | ORAL_TABLET | Freq: Every day | ORAL | 0 refills | Status: DC

## 2017-08-10 MED ORDER — SUVOREXANT 20 MG PO TABS: 20.0000 mg | ORAL_TABLET | Freq: Every day | ORAL | 0 refills | Status: DC

## 2017-08-10 MED ORDER — SUVOREXANT 15 MG PO TABS: 15.0000 mg | ORAL_TABLET | Freq: Every day | ORAL | 0 refills | Status: DC

## 2017-08-10 MED ORDER — CITALOPRAM HYDROBROMIDE 20 MG PO TABS: 20.0000 mg | ORAL_TABLET | Freq: Every day | ORAL | 5 refills | Status: DC

## 2017-08-10 NOTE — Patient Instructions (Addendum)
Start Suvorexant 5 mg (1/2 tablet) for 2 nights, then increase to 10 mg for 3 nights, then increase by 5mg  increments as needed. Max dose 20 mg nigtly Administer within 30 minutes of bedtime with at least 7 hours remaining before planned time of awakening. Onset is delayed with food; do not administer with or immediately after a meal.  Sleep Hygiene . Limiting daytime naps to 30 minutes . Napping does not make up for inadequate nighttime sleep. However, a short nap of 20-30 minutes can help to improve mood, alertness and performance.  . Avoiding stimulants such as  caffeine and nicotine close to bedtime.  And when it comes to alcohol, moderation is key 4. While alcohol is well-known to help you fall asleep faster, too much close to bedtime can disrupt sleep in the second half of the night as the body begins to process the alcohol.    . Exercising to promote good quality sleep.  As little as 10 minutes of aerobic exercise, such as walking or cycling, can drastically improve nighttime sleep quality.  For the best night's sleep, most people should avoid strenuous workouts close to bedtime. However, the effect of intense nighttime exercise on sleep differs from person to person, so find out what works best for you.   . Steering clear of food that can be disruptive right before sleep.   Heavy or rich foods, fatty or fried meals, spicy dishes, citrus fruits, and carbonated drinks can trigger indigestion for some people. When this occurs close to bedtime, it can lead to painful heartburn that disrupts sleep. . Ensuring adequate exposure to natural light.  This is particularly important for individuals who may not venture outside frequently. Exposure to sunlight during the day, as well as darkness at night, helps to maintain a healthy sleep-wake cycle . Marland Kitchen. Establishing a regular relaxing bedtime routine.  A regular nightly routine helps the body recognize that it is bedtime. This could include taking warm shower or  bath, reading a book, or light stretches. When possible, try to avoid emotionally upsetting conversations and activities before attempting to sleep. . Making sure that the sleep environment is pleasant.  Mattress and pillows should be comfortable. The bedroom should be cool - between 60 and 67 degrees - for optimal sleep. Bright light from lamps, cell phone and TV screens can make it difficult to fall asleep4, so turn those light off or adjust them when possible. Consider using blackout curtains, eye shades, ear plugs, "white noise" machines, humidifiers, fans and other devices that can make the bedroom more relaxing.

## 2017-08-10 NOTE — Progress Notes (Signed)
HPI:                                                                Meagan GreathouseMichele Floyd is a 51 y.o. female who presents to [facility]: Primary Care Sports Medicine today to establish care  Current concerns include: depression and memory loss  Depression/Anxiety: taking Celexa 20 mg without difficulty. Reports difficulty falling asleep and nighttime awakenings most nights with inability to fall back to sleep. Reports 5 hours of sleep on average, does not feel rested. Naps on the weekends. Denies symptoms of mania/hypomania. Denies suicidal thinking. Denies auditory/visual hallucinations. Previous meds: Zoloft, Paxil Previous sleep meds: Unisom, Tylenol PM  Memory issues: patient reports gradually worsening memory for a few years now. States she has difficulty with word finding. Denies other cognitive disturbances. Recently she was looking at old photos and reports she did not recall a wedding she was in a few years ago; this concerned her greatly. Reports father diagnosed with dementia at 771.   Past Medical History:  Diagnosis Date  . Depression   . Depression   . Hypertension    Past Surgical History:  Procedure Laterality Date  . ABLATION    . GASTRIC BYPASS    . SHOULDER ARTHROSCOPY    . TUMOR REMOVAL     right hand   Social History   Tobacco Use  . Smoking status: Never Smoker  . Smokeless tobacco: Never Used  Substance Use Topics  . Alcohol use: No   family history includes Bipolar disorder in her sister; Cancer in her other; Dementia in her father; Depression in her mother; Hypertension in her father and mother; Melanoma in her maternal grandmother.  ROS: negative except as noted in the HPI  Medications: Current Outpatient Medications  Medication Sig Dispense Refill  . Calcium-Magnesium-Vitamin D (CALCIUM 500 PO) Take by mouth.    . Cholecalciferol (VITAMIN D) 2000 UNITS CAPS Take by mouth.    . citalopram (CELEXA) 20 MG tablet Take 20 mg by mouth  daily.    . MULTIPLE VITAMIN PO Take by mouth.     No current facility-administered medications for this visit.    Allergies  Allergen Reactions  . Codeine   . Penicillins        Objective:  BP 139/90   Pulse 78   Wt 217 lb (98.4 kg)   BMI 37.25 kg/m  Gen:  alert, not ill-appearing, no distress, appropriate for age, obese female HEENT: head normocephalic without obvious abnormality, conjunctiva and cornea clear, trachea midline Pulm: Normal work of breathing, normal phonation CV: Normal rate, regular rhythm, s1 and s2 distinct, no murmurs, clicks or rubs  Neuro: alert and oriented x 3, no tremor MSK: extremities atraumatic, normal gait and station Skin: intact, no rashes on exposed skin, no jaundice, no cyanosis Psych: well-groomed, cooperative, good eye contact, depressed mood, affect mood-congruent, speech is articulate, and thought processes clear and goal-directed  Dep[facility] screen [facility] 08/10/2017  Decreased Interest 1  Down, Depressed, Hopeless 2  PHQ - 2 Score 3  Altered sleeping 3  Tired, decreased energy 3  Change in appetite 1  Feeling bad or failure about yourself  1  Trouble concentrating 1  Moving slowly or fidgety/restless 0  Suicidal thoughts 0  PHQ-9  Score 12    GAD 7 : Generalized Anxiety Score 08/10/2017  Nervous, Anxious, on Edge 1  Control/stop worrying 1  Worry too much - different things 1  Trouble relaxing 1  Restless 1  Easily annoyed or irritable 2  Afraid - awful might happen 0  Total GAD 7 Score 7    MMSE - Mini Mental State Exam 08/10/2017  Orientation to time 5  Orientation to Place 5  Registration 3  Attention/ Calculation 5  Recall 3  Language- name 2 objects 2  Language- repeat 1  Language- follow 3 step command 3  Language- read & follow direction 1  Write a sentence 1  Copy design 0  Total score 29    No results found for this or any previous visit (from the past 72 hour(s)). No results found.    Assessment  and Plan: 51 y.o. female with   1. Encounter to establish care - reviewed PMH, PSH, PFH, medications and allergies - reviewed health maintenance - due for mammogram - Pap smear UTD, will abstract from [facility] - influenza UTD - Tdap UTD   2. Moderate episode of recurrent major depressive disorder (HCC) - PHQ9 score 12, moderate, no acute safety issues - discussed treatment options to include increasing Celexa, or tapering off and starting Trintellix. Patient would like to leave dose the same for now and treat insomnia - citalopram (CELEXA) 20 MG tablet; Take 1 tablet (20 mg total) daily by mouth.  Dispense: 30 tablet; Refill: 5  3. Breast cancer screening by mammogram - MM SCREENING BREAST TOMO BILATERAL; Future  4. Insomnia due to other mental disorder - Suvorexant 10 MG TABS; Take 10 mg at bedtime by mouth.  Dispense: 10 tablet; Refill: 0 - Suvorexant 15 MG TABS; Take 15 mg at bedtime by mouth.  Dispense: 10 tablet; Refill: 0 - Suvorexant 20 MG TABS; Take 20 mg at bedtime by mouth.  Dispense: 10 tablet; Refill: 0  5. Memory loss of unknown cause, Word finding difficulty - normal MMSE today, depression is not well controlled - differential includes pseudodementia versus encephalitis versus other dementia - RPR - Vitamin B12 - Sedimentation rate - CBC - TSH - Folate   Patient education and anticipatory guidance given Patient agrees with treatment plan Follow-up in 4 weeks or sooner as needed if symptoms worsen or fail to improve  Levonne Hubertharley E. Sharai Overbay PA-C

## 2017-08-11 ENCOUNTER — Encounter: Payer: Self-pay | Admitting: Physician Assistant

## 2017-08-11 DIAGNOSIS — R4789 Other speech disturbances: Secondary | ICD-10-CM | POA: Insufficient documentation

## 2017-08-11 DIAGNOSIS — F5105 Insomnia due to other mental disorder: Secondary | ICD-10-CM | POA: Insufficient documentation

## 2017-08-11 DIAGNOSIS — D509 Iron deficiency anemia, unspecified: Secondary | ICD-10-CM

## 2017-08-11 DIAGNOSIS — F99 Mental disorder, not otherwise specified: Secondary | ICD-10-CM

## 2017-08-11 DIAGNOSIS — R413 Other amnesia: Secondary | ICD-10-CM | POA: Insufficient documentation

## 2017-08-11 DIAGNOSIS — R700 Elevated erythrocyte sedimentation rate: Secondary | ICD-10-CM | POA: Insufficient documentation

## 2017-08-11 HISTORY — DX: Iron deficiency anemia, unspecified: D50.9

## 2017-08-11 LAB — CBC
HEMATOCRIT: 34.6 % — AB (ref 35.0–45.0)
HEMOGLOBIN: 10.8 g/dL — AB (ref 11.7–15.5)
MCH: 23.8 pg — ABNORMAL LOW (ref 27.0–33.0)
MCHC: 31.2 g/dL — ABNORMAL LOW (ref 32.0–36.0)
MCV: 76.2 fL — AB (ref 80.0–100.0)
MPV: 10.1 fL (ref 7.5–12.5)
Platelets: 433 10*3/uL — ABNORMAL HIGH (ref 140–400)
RBC: 4.54 10*6/uL (ref 3.80–5.10)
RDW: 15.9 % — ABNORMAL HIGH (ref 11.0–15.0)
WBC: 9.9 10*3/uL (ref 3.8–10.8)

## 2017-08-11 LAB — SEDIMENTATION RATE: Sed Rate: 51 mm/h — ABNORMAL HIGH (ref 0–20)

## 2017-08-11 LAB — VITAMIN B12: VITAMIN B 12: 684 pg/mL (ref 200–1100)

## 2017-08-11 LAB — FOLATE: Folate: 18.6 ng/mL

## 2017-08-11 LAB — TSH: TSH: 1.2 mIU/L

## 2017-08-11 LAB — RPR: RPR: NONREACTIVE

## 2017-08-11 NOTE — Progress Notes (Signed)
Labs show an elevated inflammatory marker (ESR) as well as a mild anemia I have ordered an MRI of the brain with and without contrast Due to the evidence of inflammation, I would like her to have this imagine done in the next 24-48 hours.  She will be contacted to schedule the appointment Let me know if you will need any anti-anxiety/sedation for the MRI

## 2017-08-12 ENCOUNTER — Encounter: Payer: Self-pay | Admitting: Physician Assistant

## 2017-08-13 ENCOUNTER — Ambulatory Visit (HOSPITAL_COMMUNITY)
Admission: RE | Admit: 2017-08-13 | Discharge: 2017-08-13 | Disposition: A | Discharge status: Home / Self Care | Payer: BLUE CROSS/BLUE SHIELD | Source: Ambulatory Visit | Attending: Physician Assistant | Admitting: Physician Assistant

## 2017-08-13 DIAGNOSIS — R4789 Other speech disturbances: Secondary | ICD-10-CM | POA: Insufficient documentation

## 2017-08-13 DIAGNOSIS — R9389 Abnormal findings on diagnostic imaging of other specified body structures: Secondary | ICD-10-CM | POA: Diagnosis not present

## 2017-08-13 DIAGNOSIS — R413 Other amnesia: Secondary | ICD-10-CM | POA: Diagnosis not present

## 2017-08-13 MED ORDER — GADOBENATE DIMEGLUMINE 529 MG/ML IV SOLN
20.0000 mL | Freq: Once | INTRAVENOUS | Status: AC | PRN
  Administered 2017-08-13: 20 mL via INTRAVENOUS

## 2017-08-14 ENCOUNTER — Encounter: Payer: Self-pay | Admitting: Neurology

## 2017-08-14 ENCOUNTER — Encounter: Payer: Self-pay | Admitting: Physician Assistant

## 2017-08-14 ENCOUNTER — Other Ambulatory Visit: Payer: Self-pay | Admitting: Physician Assistant

## 2017-08-14 DIAGNOSIS — R413 Other amnesia: Secondary | ICD-10-CM

## 2017-08-14 DIAGNOSIS — E236 Other disorders of pituitary gland: Secondary | ICD-10-CM

## 2017-08-14 DIAGNOSIS — R4789 Other speech disturbances: Secondary | ICD-10-CM

## 2017-08-14 HISTORY — DX: Other disorders of pituitary gland: E23.6

## 2017-08-14 NOTE — Progress Notes (Signed)
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 This is unrelated to her symptoms. I am going to refer her to neurology for further evaluation of memory/word-finding difficulties.

## 2017-09-03 ENCOUNTER — Ambulatory Visit (INDEPENDENT_AMBULATORY_CARE_PROVIDER_SITE_OTHER): Payer: BLUE CROSS/BLUE SHIELD

## 2017-09-03 DIAGNOSIS — Z1231 Encounter for screening mammogram for malignant neoplasm of breast: Secondary | ICD-10-CM | POA: Diagnosis not present

## 2017-09-03 NOTE — Progress Notes (Signed)
Hi Adelyna,  Your mammogram was normal. Recommend repeat screening in 1 year.  Best, Vinetta Bergamoharley

## 2017-09-07 ENCOUNTER — Ambulatory Visit: Payer: BLUE CROSS/BLUE SHIELD | Admitting: Physician Assistant

## 2017-09-12 ENCOUNTER — Encounter: Payer: Self-pay | Admitting: Physician Assistant

## 2017-09-17 ENCOUNTER — Encounter: Payer: Self-pay | Admitting: Physician Assistant

## 2017-09-17 ENCOUNTER — Ambulatory Visit (INDEPENDENT_AMBULATORY_CARE_PROVIDER_SITE_OTHER): Payer: BLUE CROSS/BLUE SHIELD | Admitting: Physician Assistant

## 2017-09-17 VITALS — BP 130/85 | HR 82 | Wt 218.0 lb

## 2017-09-17 DIAGNOSIS — D509 Iron deficiency anemia, unspecified: Secondary | ICD-10-CM | POA: Diagnosis not present

## 2017-09-17 DIAGNOSIS — F99 Mental disorder, not otherwise specified: Secondary | ICD-10-CM

## 2017-09-17 DIAGNOSIS — F5105 Insomnia due to other mental disorder: Secondary | ICD-10-CM

## 2017-09-17 DIAGNOSIS — F3341 Major depressive disorder, recurrent, in partial remission: Secondary | ICD-10-CM | POA: Diagnosis not present

## 2017-09-17 LAB — CBC
HEMATOCRIT: 32.9 % — AB (ref 35.0–45.0)
Hemoglobin: 10.2 g/dL — ABNORMAL LOW (ref 11.7–15.5)
MCH: 23.7 pg — ABNORMAL LOW (ref 27.0–33.0)
MCHC: 31 g/dL — ABNORMAL LOW (ref 32.0–36.0)
MCV: 76.5 fL — AB (ref 80.0–100.0)
MPV: 10.3 fL (ref 7.5–12.5)
PLATELETS: 383 10*3/uL (ref 140–400)
RBC: 4.3 10*6/uL (ref 3.80–5.10)
RDW: 16 % — AB (ref 11.0–15.0)
WBC: 8.3 10*3/uL (ref 3.8–10.8)

## 2017-09-17 LAB — IRON,TIBC AND FERRITIN PANEL
%SAT: 8 % — AB (ref 11–50)
FERRITIN: 6 ng/mL — AB (ref 10–232)
Iron: 33 ug/dL — ABNORMAL LOW (ref 40–190)
TIBC: 402 ug/dL (ref 250–450)

## 2017-09-17 MED ORDER — GABAPENTIN 100 MG PO CAPS: 100.0000 mg | ORAL_CAPSULE | Freq: Every day | ORAL | 3 refills | Status: DC

## 2017-09-17 NOTE — Progress Notes (Signed)
HPI:                                                                Meagan GreathouseMichele Floyd is a 51 y.o. female who [facility] [place] Primary Care [facility] Medicine today for anxiety / insomnia follow-up  Depression/Anxiety: taking Celexa 20 mg without difficulty. Reports mood is improved. Denies anhedonia or depressive symptoms. Anxiety symptoms are well controlled. Denies symptoms of mania/hypomania. Denies suicidal thinking. Denies auditory/visual hallucinations. Previous meds: Zoloft, Paxil  Insomnia: reports she tried Belsomra 10mg  for 10 days and had a sensation of "things crawling under my skin." She continues to have nighttime awakenings and fragmented sleep. Reports difficulty falling asleep and nighttime awakenings most nights with inability to fall back to sleep. Reports 5 hours of sleep on average, does not feel rested. Naps on the weekends. Previous sleep meds: Belsomra, Unisom, Tylenol PM  Past Medical History:  Diagnosis Date  . Depression   . Depression   . 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 Hypertension   . Microcytic anemia 08/11/2017   Past Surgical History:  Procedure Laterality Date  . ABLATION    . GASTRIC BYPASS    . SHOULDER ARTHROSCOPY    . TUMOR REMOVAL     right hand   Social History   Tobacco Use  . Smoking status: Never Smoker  . Smokeless tobacco: Never Used  Substance Use Topics  . Alcohol use: No   family history includes Bipolar disorder in her sister; Cancer in her other; Dementia in her father; Depression in her mother; Hypertension in her father and mother; Melanoma in her maternal grandmother.  ROS: negative except as noted in the HPI  Medications: Current Outpatient Medications  Medication Sig Dispense Refill  . Calcium-Magnesium-Vitamin D (CALCIUM 500 PO) Take by mouth.    . Cholecalciferol (VITAMIN D) 2000 UNITS CAPS Take by mouth.    . citalopram (CELEXA) 20 MG tablet Take 1  tablet (20 mg total) daily by mouth. 30 tablet 5  . MULTIPLE VITAMIN PO Take by mouth.    . Ferrous Sulfate 50 MG TBCR Take 2 tabs PO bid on Mondays, Wednesdays, and Fridays 60 tablet 3  . gabapentin (NEURONTIN) 100 MG capsule Take 1-3 capsules (100-300 mg total) by mouth at bedtime. 90 capsule 3   No current facility-administered medications for this visit.    Allergies  Allergen Reactions  . Belsomra [Suvorexant] Other (See Comments)  . Codeine   . Penicillins        Objective:  BP 130/85   Pulse 82   Wt 218 lb (98.9 kg)   BMI 37.42 kg/m  Gen:  alert, not ill-appearing, no distress, appropriate for age HEENT: head normocephalic without obvious abnormality, conjunctiva and cornea clear, trachea midline Pulm: Normal work of breathing, normal phonation, clear to auscultation bilaterally, no wheezes, rales or rhonchi CV: Normal rate, regular rhythm, s1 and s2 distinct, no murmurs, clicks or rubs  Neuro: alert and oriented x 3, no tremor MSK: extremities atraumatic, normal gait and station Skin: intact, no rashes on exposed skin, no jaundice, no cyanosis Psych: well-groomed, cooperative, good eye contact, euthymic mood, affect mood-congruent, speech is articulate, and thought processes clear and goal-directed  Depression screen  [facility] 09/17/2017 08/10/2017  Decreased Interest 0 1  Down, Depressed, Hopeless 0 2  PHQ - 2 Score 0 3  Altered sleeping 3 3  Tired, decreased energy 3 3  Change in appetite 1 1  Feeling bad or failure about yourself  0 1  Trouble concentrating 0 1  Moving slowly or fidgety/restless 0 0  Suicidal thoughts 0 0  PHQ-9 Score 7 12  Difficult doing work/chores Not difficult at all -   GAD 7 : Generalized Anxiety Score 09/17/2017 08/10/2017  Nervous, Anxious, on Edge 1 1  Control/stop worrying 0 1  Worry too much - different things 0 1  Trouble relaxing 1 1  Restless 0 1  Easily annoyed or irritable - 2  Afraid - awful might happen 0 0  Total GAD  7 Score - 7      No results found for this or any previous visit (from the past 72 hour(s)). No results found.    Assessment and Plan: 51 y.o. female with   1. Microcytic anemia - CBC - Fe+TIBC+Fer  2. Insomnia due to other mental disorder - did not respond to Belsomra - given comorbid anemia, I am wondering if symptoms are more restless leg than insomnia - will trial low-dose Gabapentin at bedtime - follow-up in 4 weeks  3. Iron deficiency anemia, unspecified iron deficiency anemia type - Hgb 10.8, MCV 76.2, 1 month ago. Starting iron replacement. Recheck labs in 8 weeks. Trend ferritin. - Ferrous Sulfate 50 MG TBCR; Take 2 tabs PO bid on Mondays, Wednesdays, and Fridays  Dispense: 60 tablet; Refill: 3 - CBC; Future - Ferritin; Future - Retic; Future  4. 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 Sulma Ruffino PA-C

## 2017-09-17 NOTE — Patient Instructions (Addendum)
-   Start with 1 capsule 30 minutes prior to bedtime - If needed, increase to 2 capsules after 3 days - Increase to 3 capsules after 3 days

## 2017-09-18 DIAGNOSIS — D509 Iron deficiency anemia, unspecified: Secondary | ICD-10-CM | POA: Insufficient documentation

## 2017-09-18 MED ORDER — FERROUS SULFATE ER 50 MG PO TBCR: EXTENDED_RELEASE_TABLET | ORAL | 3 refills | Status: DC

## 2017-09-18 NOTE — Progress Notes (Signed)
Hi Meagan Floyd,  Your labs are showing iron deficiency anemia. Given that you are not having menstrual periods, this is a little unusual. I am going to have you stop by the office to pick up a fecal occult blood test to make sure there is no microscopic blood in your stool.  I am going to start you on oral iron supplementation as well and recheck your labs in 8 weeks to see you how you are responding to treatment.  I want you to take Ferrous Sulfate 100 mg twice a day on Mondays, Wednesdays and Fridays. Also, if you like orange juice, it will help with absorption. If you notice some constipation, start a stool softener.  Let me know if you have any questions or concerns.  Meagan Floyd

## 2017-09-27 ENCOUNTER — Encounter: Payer: Self-pay | Admitting: Physician Assistant

## 2017-10-09 ENCOUNTER — Encounter: Payer: Self-pay | Admitting: Physician Assistant

## 2017-10-15 ENCOUNTER — Ambulatory Visit: Payer: BLUE CROSS/BLUE SHIELD | Admitting: Physician Assistant

## 2017-10-28 ENCOUNTER — Ambulatory Visit: Payer: Self-pay | Admitting: Physician Assistant

## 2017-10-28 DIAGNOSIS — Z0189 Encounter for other specified special examinations: Secondary | ICD-10-CM

## 2017-11-02 ENCOUNTER — Ambulatory Visit: Payer: BLUE CROSS/BLUE SHIELD | Admitting: Neurology

## 2018-01-13 ENCOUNTER — Other Ambulatory Visit: Payer: Self-pay

## 2018-01-13 ENCOUNTER — Encounter: Payer: Self-pay | Admitting: Neurology

## 2018-01-13 ENCOUNTER — Ambulatory Visit (INDEPENDENT_AMBULATORY_CARE_PROVIDER_SITE_OTHER): Payer: 59 | Admitting: Neurology

## 2018-01-13 VITALS — BP 144/76 | HR 78 | Ht 64.0 in | Wt 218.0 lb

## 2018-01-13 DIAGNOSIS — F329 Major depressive disorder, single episode, unspecified: Secondary | ICD-10-CM

## 2018-01-13 DIAGNOSIS — R413 Other amnesia: Secondary | ICD-10-CM

## 2018-01-13 DIAGNOSIS — F32A Depression, unspecified: Secondary | ICD-10-CM

## 2018-01-13 DIAGNOSIS — R51 Headache: Secondary | ICD-10-CM | POA: Diagnosis not present

## 2018-01-13 DIAGNOSIS — E236 Other disorders of pituitary gland: Secondary | ICD-10-CM

## 2018-01-13 DIAGNOSIS — R519 Headache, unspecified: Secondary | ICD-10-CM

## 2018-01-13 NOTE — Progress Notes (Signed)
NEUROLOGY CONSULTATION NOTE  Meagan Floyd MRN: [id number] DOB: 1966/05/18  Referring provider: Doran Heater, PA-C Primary care provider: Doran Heater, PA-C  Reason for consult:  Memory changes, word-finding difficulties  Thank you for your kind referral of Meagan Floyd for consultation of the above symptoms. Although her history is well known to you, please allow me to reiterate it for the purpose of our medical record.Records and images were personally reviewed where available.  HISTORY OF PRESENT ILLNESS: This is a pleasant 52 year old right-handed woman with a history of hypertension, migraines, pseudotumor cerebri diagnosed in her early 85s, presenting for evaluation of memory changes. She noticed changes for the past couple of years where she would not recall an event her husband would remember from the past. They moved recently and she was unpacking and saw a picture of her friend's wedding from 8-10 years ago that she did not remember going to at all. 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 She reports memory issues have not affected her work because she has been taking a lot more notes in the past couple of years. She denies getting lost driving but would not recall how she got from point A to B. All her bills are on autopay, no missed bills. She has an alarm for her medications and denies missing medications. She has noticed word-finding difficulties where she knows the word she want to say but cannot think of the right word. Her father had dementia in his 29s. She had a mild concussion 5 or so years ago when she fell back walking her dog, no loss of consciousness. She denies any alcohol intake.  She started having migraines in her 57s. She saw an ophthalmologist around that time and was told she had increased pressure in her eyes and had a lumbar puncture with elevated pressure reported. She was  started on Topamax which did help with the headaches. She recalls having a spinal leak after the LP and needed a blood patch. At some point Topamax was stopped, she does not recall having side effects on it. She has seen her eye doctor recently and has not been told of any pressure issues. Over the past year, she has had constant headaches with nausea when pain intensifies. She has throbbing pain is on the vertex or behind her right ear. No vomiting. No clear positional component, she usually wants to lie down. Tylenol eases it some. No photo/phonophobia or tinnitus. She denies any dizziness, diplopia, dysarthria/dysphagia, focal numbness/tingling/weakness, bowel/bladder dysfunction, anosmia, or tremors. She has had right-sided neck pain since Sunday which she feels is sinus-related. She has been taking gabapentin 100mg  qhs with no side effects.   She had an MRI brain with and without contrast on 07/2017 which I personally reviewed, no acute changes. 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  Laboratory Data: Lab Results  Component Value Date   TSH 1.20 08/10/2017   Lab Results  Component Value Date   VITAMINB12 684 08/10/2017     PAST MEDICAL HISTORY: Past Medical History:  Diagnosis Date  . Depression   . Depression   . 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 Hypertension   . Microcytic anemia 08/11/2017    PAST SURGICAL HISTORY: Past Surgical History:  Procedure Laterality Date  .  ABLATION    . GASTRIC BYPASS    . SHOULDER ARTHROSCOPY    . TUMOR REMOVAL     right hand    MEDICATIONS: Current Outpatient Medications on File Prior to Visit  Medication Sig Dispense Refill  . Calcium-Magnesium-Vitamin D (CALCIUM 500 PO) Take by mouth.    . Cholecalciferol (VITAMIN D) 2000 UNITS CAPS Take by mouth.    . citalopram (CELEXA) 20 MG tablet  Take 1 tablet (20 mg total) daily by mouth. 30 tablet 5  . Ferrous Sulfate 50 MG TBCR Take 2 tabs PO bid on Mondays, Wednesdays, and Fridays 60 tablet 3  . gabapentin (NEURONTIN) 100 MG capsule Take 1-3 capsules (100-300 mg total) by mouth at bedtime. 90 capsule 3  . MULTIPLE VITAMIN PO Take by mouth.     No current facility-administered medications on file prior to visit.     ALLERGIES: Allergies  Allergen Reactions  . Belsomra [Suvorexant] Other (See Comments)  . Codeine   . Penicillins     FAMILY HISTORY: Family History  Problem Relation Age of Onset  . Hypertension Mother   . Depression Mother   . Cancer Other        skin  . Hypertension Father   . Dementia Father   . Bipolar disorder Sister   . Melanoma Maternal Grandmother     SOCIAL HISTORY: Social History   Socioeconomic History  . Marital status: Married    Spouse name: Not on file  . Number of children: Not on file  . Years of education: Not on file  . Highest education level: Not on file  Occupational History  . Not on file  Social Needs  . Financial resource strain: Not on file  . Food insecurity:    Worry: Not on file    Inability: Not on file  . Transportation needs:    Medical: Not on file    Non-medical: Not on file  Tobacco Use  . Smoking status: Never Smoker  . Smokeless tobacco: Never Used  Substance and Sexual Activity  . Alcohol use: No  . Drug use: No  . Sexual activity: Yes    Birth control/protection: None  Lifestyle  . Physical activity:    Days per week: Not on file    Minutes per session: Not on file  . Stress: Not on file  Relationships  . Social connections:    Talks on phone: Not on file    Gets together: Not on file    Attends religious service: Not on file    Active member of club or organization: Not on file    Attends meetings of clubs or organizations: Not on file    Relationship status: Not on file  . Intimate partner violence:    Fear of current or ex partner:  Not on file    Emotionally abused: Not on file    Physically abused: Not on file    Forced sexual activity: Not on file  Other Topics Concern  . Not on file  Social History Narrative  . Not on file    REVIEW OF SYSTEMS: Constitutional: No fevers, chills, or sweats, no generalized fatigue, change in appetite Eyes: No visual changes, double vision, eye pain Ear, nose and throat: No hearing loss, ear pain, nasal congestion, sore throat Cardiovascular: No chest pain, palpitations Respiratory:  No shortness of breath at rest or with exertion, wheezes GastrointestinaI: No nausea, vomiting, diarrhea, abdominal pain, fecal incontinence Genitourinary:  No dysuria, urinary retention or  frequency Musculoskeletal:  + neck pain, no back pain Integumentary: No rash, pruritus, skin lesions Neurological: as above Psychiatric: No depression, insomnia, anxiety Endocrine: No palpitations, fatigue, diaphoresis, mood swings, change in appetite, change in weight, increased thirst Hematologic/Lymphatic:  No anemia, purpura, petechiae. Allergic/Immunologic: no itchy/runny eyes, nasal congestion, recent allergic reactions, rashes  PHYSICAL EXAM: Vitals:   01/13/18 1406  BP: (!) 144/76  Pulse: 78  SpO2: 99%   General: No acute distress Head:  Normocephalic/atraumatic Eyes: Unable to clearly visualize fundi bilaterally Neck: supple, no paraspinal tenderness, full range of motion Back: No paraspinal tenderness Heart: regular rate and rhythm Lungs: Clear to auscultation bilaterally. Vascular: No carotid bruits. Skin/Extremities: No rash, no edema Neurological Exam: Mental status: alert and oriented to person, place, and time, no dysarthria or aphasia, Fund of knowledge is appropriate.  Recent and remote memory are intact.  Attention and concentration are normal.    Able to name objects and repeat phrases.  Montreal Cognitive Assessment  01/13/2018  Visuospatial/ Executive (0/5) 5  Naming (0/3) 3    Attention: Read list of digits (0/2) 2  Attention: Read list of letters (0/1) 1  Attention: Serial 7 subtraction starting at 100 (0/3) 3  Language: Repeat phrase (0/2) 2  Language : Fluency (0/1) 1  Abstraction (0/2) 2  Delayed Recall (0/5) 4  Orientation (0/6) 6  Total 29   Cranial nerves: CN I: not tested CN II: pupils equal, round and reactive to light, visual fields intact,unable to clearly visualize fundi bilaterally CN III, IV, VI:  full range of motion, no nystagmus, no ptosis CN V: facial sensation intact CN VII: upper and lower face symmetric CN VIII: hearing intact to finger rub CN IX, X: gag intact, uvula midline CN XI: sternocleidomastoid and trapezius muscles intact CN XII: tongue midline Bulk & Tone: normal, no fasciculations. Motor: 5/5 throughout with no pronator drift. Sensation: intact to light touch, cold, pin, vibration and joint position sense.  No extinction to double simultaneous stimulation.  Romberg test negative Deep Tendon Reflexes: +2 throughout, no ankle clonus Plantar responses: downgoing bilaterally Cerebellar: no incoordination on finger to nose, heel to shin. No dysdiadochokinesia Gait: narrow-based and steady, able to tandem walk adequately. Tremor: none  IMPRESSION: This is a pleasant 52 year old right-handed woman with a history of  hypertension, migraines, pseudotumor cerebri diagnosed in her early 9120s, presenting for evaluation of memory changes. Her neurological exam is normal, MOCA score normal 29/30. We discussed different causes of memory changes. TSH and B12 normal. MRI brain no acute abnormalities. We discussed how mood can also affect memory, she endorsed a lot of stress at home and is agreeable to seeing a counselor for depression. If memory issues continue despite better treatment of depression, we will plan for Neurocognitive testing. We discussed the empty sella on her MRI, she does endorse a history of pseudotumor cerebri in her 5720s  and over the past year has had a constant headache. We discussed doing another lumbar puncture to measure opening pressure. If normal, we can increase gabapentin for the headaches. If abnormal, we will discuss starting Diamox. She had previously taken Topamax, but this may worsen cognitive issues. She will follow-up in 4-5 months and knows to call for any changes.   Thank you for allowing me to participate in the care of this patient. Please do not hesitate to call for any questions or concerns.   Patrcia DollyKaren Mylon Mabey, M.D.  CC: Doran HeaterElizabeth Cummings, PA-C

## 2018-01-13 NOTE — Patient Instructions (Addendum)
1. Schedule lumbar puncture under fluoroscopy to measure opening pressure 2. [facility]  [facility]  Please call [phone] to schedule your first appointment.   3. Follow-up in 4-5 months, call for any changes

## 2018-01-20 ENCOUNTER — Other Ambulatory Visit: Payer: Self-pay

## 2018-01-20 ENCOUNTER — Encounter: Payer: Self-pay | Admitting: Neurology

## 2018-01-20 DIAGNOSIS — R413 Other amnesia: Secondary | ICD-10-CM

## 2018-01-20 DIAGNOSIS — R4789 Other speech disturbances: Secondary | ICD-10-CM

## 2018-01-20 DIAGNOSIS — R519 Headache, unspecified: Secondary | ICD-10-CM

## 2018-01-20 DIAGNOSIS — R51 Headache: Secondary | ICD-10-CM

## 2018-01-26 ENCOUNTER — Other Ambulatory Visit: Payer: Self-pay

## 2018-01-26 DIAGNOSIS — R413 Other amnesia: Secondary | ICD-10-CM

## 2018-01-26 DIAGNOSIS — R4789 Other speech disturbances: Secondary | ICD-10-CM

## 2018-02-02 ENCOUNTER — Encounter: Payer: Self-pay | Admitting: Neurology

## 2018-02-02 ENCOUNTER — Ambulatory Visit
Admission: RE | Admit: 2018-02-02 | Discharge: 2018-02-02 | Disposition: A | Discharge status: Home / Self Care | Payer: 59 | Source: Ambulatory Visit | Attending: Neurology | Admitting: Neurology

## 2018-02-02 DIAGNOSIS — R413 Other amnesia: Secondary | ICD-10-CM

## 2018-02-02 DIAGNOSIS — R4789 Other speech disturbances: Secondary | ICD-10-CM

## 2018-02-02 NOTE — Discharge Instructions (Signed)

## 2018-02-03 ENCOUNTER — Telehealth: Payer: Self-pay | Admitting: Neurology

## 2018-02-03 NOTE — Telephone Encounter (Signed)
Left VM for patient to call back re: LP opening pressure results, would start Diamox.

## 2018-02-04 ENCOUNTER — Telehealth: Payer: Self-pay | Admitting: Neurology

## 2018-02-04 DIAGNOSIS — R51 Headache: Principal | ICD-10-CM

## 2018-02-04 DIAGNOSIS — R519 Headache, unspecified: Secondary | ICD-10-CM

## 2018-02-04 DIAGNOSIS — G932 Benign intracranial hypertension: Secondary | ICD-10-CM | POA: Insufficient documentation

## 2018-02-04 MED ORDER — PROMETHAZINE HCL 12.5 MG PO TABS: 12.5000 mg | ORAL_TABLET | Freq: Four times a day (QID) | ORAL | 1 refills | Status: DC | PRN

## 2018-02-04 MED ORDER — BUTALBITAL-APAP-CAFFEINE 50-300-40 MG PO CAPS: ORAL_CAPSULE | ORAL | 0 refills | Status: DC

## 2018-02-04 NOTE — Telephone Encounter (Signed)
Pt's spouse called to say pt had a spinal tap and now pt is having very bad headaches and wants a call back regarding this

## 2018-02-04 NOTE — Telephone Encounter (Signed)
Order for blood patch placed.

## 2018-02-04 NOTE — Telephone Encounter (Signed)
Spoke to husband, patient was up all night with significant headache and nausea. She needed to have a blood patch after her last LP 20 years ago. Recommend doing blood patch, will send order. In the meantime, recommended increasing caffeine intake, husband reports she cannot keep anything down from nausea (no vomiting). Will send in Rx for Fioricet for post-LP headaches, as well as Phenergan for nausea. Discussed results of LP with husband showing elevated CSF opening pressure and starting Diamox, however he is more concerned about current headache. After she is better, I will speak to patient about Diamox.  Meagen, pls send in order for blood patch. Thanks!

## 2018-02-04 NOTE — Telephone Encounter (Signed)
I saw that you tried calling pt yesterday.Marland KitchenMarland Kitchen

## 2018-02-04 NOTE — Telephone Encounter (Signed)
See other phone note

## 2018-02-05 ENCOUNTER — Ambulatory Visit
Admission: RE | Admit: 2018-02-05 | Discharge: 2018-02-05 | Disposition: A | Discharge status: Home / Self Care | Payer: 59 | Source: Ambulatory Visit | Attending: Neurology | Admitting: Neurology

## 2018-02-05 DIAGNOSIS — R519 Headache, unspecified: Secondary | ICD-10-CM

## 2018-02-05 DIAGNOSIS — R51 Headache: Principal | ICD-10-CM

## 2018-02-05 MED ORDER — IOPAMIDOL (ISOVUE-M 200) INJECTION 41%
1.0000 mL | Freq: Once | INTRAMUSCULAR | Status: AC
  Administered 2018-02-05: 1 mL via EPIDURAL

## 2018-02-05 NOTE — Discharge Instructions (Signed)

## 2018-02-05 NOTE — Progress Notes (Signed)
20cc blood drawn from right AC space for Epidural Blood Patch; site unremarkable. 

## 2018-02-06 ENCOUNTER — Other Ambulatory Visit: Payer: Self-pay | Admitting: Physician Assistant

## 2018-02-06 DIAGNOSIS — F5105 Insomnia due to other mental disorder: Secondary | ICD-10-CM

## 2018-02-06 DIAGNOSIS — F99 Mental disorder, not otherwise specified: Principal | ICD-10-CM

## 2018-02-06 LAB — CRYPTOCOCCAL AG, LTX SCR RFLX TITER
CRYPTOCOCCAL AG SCREEN: NOT DETECTED
MICRO NUMBER:: 90555100
SPECIMEN QUALITY:: ADEQUATE

## 2018-02-06 LAB — PROTEIN, CSF: Total Protein, CSF: 24 mg/dL (ref 15–45)

## 2018-02-06 LAB — CSF CULTURE W GRAM STAIN
Result:: NO GROWTH
SPECIMEN QUALITY:: ADEQUATE

## 2018-02-06 LAB — CSF CELL COUNT WITH DIFFERENTIAL
RBC COUNT CSF: 0 {cells}/uL (ref 0–10)
WBC, CSF: 1 cells/uL (ref 0–5)

## 2018-02-06 LAB — CSF CULTURE: MICRO NUMBER: 90555101

## 2018-02-06 LAB — GLUCOSE, CSF: Glucose, CSF: 57 mg/dL (ref 40–80)

## 2018-02-18 ENCOUNTER — Telehealth: Payer: Self-pay

## 2018-02-18 ENCOUNTER — Telehealth: Payer: Self-pay | Admitting: Neurology

## 2018-02-18 NOTE — Telephone Encounter (Signed)
Returned call to pt.  No answer.  LMOM asking for return call.  

## 2018-02-18 NOTE — Telephone Encounter (Signed)
Called to check on pt and see how she is doing post LP/blood patch.  No answer.  LMOM asking pt to return call to the office.

## 2018-02-18 NOTE — Telephone Encounter (Signed)
Patient lmom that she was returning your call. Thanks °

## 2018-02-19 ENCOUNTER — Telehealth: Payer: Self-pay | Admitting: Neurology

## 2018-02-19 NOTE — Telephone Encounter (Signed)
Pt left a VM message saying she was returning Meagen's call

## 2018-03-16 ENCOUNTER — Ambulatory Visit (INDEPENDENT_AMBULATORY_CARE_PROVIDER_SITE_OTHER): Payer: 59 | Admitting: Physician Assistant

## 2018-03-16 ENCOUNTER — Encounter: Payer: Self-pay | Admitting: Physician Assistant

## 2018-03-16 VITALS — BP 118/80 | HR 85 | Temp 98.5°F | Wt 222.0 lb

## 2018-03-16 DIAGNOSIS — J029 Acute pharyngitis, unspecified: Secondary | ICD-10-CM | POA: Diagnosis not present

## 2018-03-16 MED ORDER — AZITHROMYCIN 250 MG PO TABS: ORAL_TABLET | ORAL | 0 refills | Status: DC

## 2018-03-16 NOTE — Patient Instructions (Addendum)
For sore throat: - Tylenol 1000mg every 8 hours as needed for throat pain. Alternate with Ibuprofen 600mg every 6 hours - Cepacol throat lozenges and/or Chloraseptic spray - Warm salt water gargles   Pharyngitis Pharyngitis is redness, pain, and swelling (inflammation) of the throat (pharynx). It is a very common cause of sore throat. Pharyngitis can be caused by a bacteria, but it is usually caused by a virus. Most cases of pharyngitis get better on their own without treatment. What are the causes? This condition may be caused by:  Infection by viruses (viral). Viral pharyngitis spreads from person to person (is contagious) through coughing, sneezing, and sharing of personal items or utensils such as cups, forks, spoons, and toothbrushes.  Infection by bacteria (bacterial). Bacterial pharyngitis may be spread by touching the nose or face after coming in contact with the bacteria, or through more intimate contact, such as kissing.  Allergies. Allergies can cause buildup of mucus in the throat (post-nasal drip), leading to inflammation and irritation. Allergies can also cause blocked nasal passages, forcing breathing through the mouth, which dries and irritates the throat.  What increases the risk? You are more likely to develop this condition if:  You are 5-24 years old.  You are exposed to crowded environments such as daycare, school, or dormitory living.  You live in a cold climate.  You have a weakened disease-fighting (immune) system.  What are the signs or symptoms? Symptoms of this condition vary by the cause (viral, bacterial, or allergies) and can include:  Sore throat.  Fatigue.  Low-grade fever.  Headache.  Joint pain and muscle aches.  Skin rashes.  Swollen glands in the throat (lymph nodes).  Plaque-like film on the throat or tonsils. This is often a symptom of bacterial pharyngitis.  Vomiting.  Stuffy nose (nasal congestion).  Cough.  Red, itchy eyes  (conjunctivitis).  Loss of appetite.  How is this diagnosed? This condition is often diagnosed based on your medical history and a physical exam. Your health care provider will ask you questions about your illness and your symptoms. A swab of your throat may be done to check for bacteria (rapid strep test). Other lab tests may also be done, depending on the suspected cause, but these are rare. How is this treated? This condition usually gets better in 3-4 days without medicine. Bacterial pharyngitis may be treated with antibiotic medicines. Follow these instructions at home:  Take over-the-counter and prescription medicines only as told by your health care provider. ? If you were prescribed an antibiotic medicine, take it as told by your health care provider. Do not stop taking the antibiotic even if you start to feel better. ? Do not give children aspirin because of the association with Reye syndrome.  Drink enough water and fluids to keep your urine clear or pale yellow.  Get a lot of rest.  Gargle with a salt-water mixture 3-4 times a day or as needed. To make a salt-water mixture, completely dissolve -1 tsp of salt in 1 cup of warm water.  If your health care provider approves, you may use throat lozenges or sprays to soothe your throat. Contact a health care provider if:  You have large, tender lumps in your neck.  You have a rash.  You cough up green, yellow-brown, or bloody spit. Get help right away if:  Your neck becomes stiff.  You drool or are unable to swallow liquids.  You cannot drink or take medicines without vomiting.  You have severe   pain that does not go away, even after you take medicine.  You have trouble breathing, and it is not caused by a stuffy nose.  You have new pain and swelling in your joints such as the knees, ankles, wrists, or elbows. Summary  Pharyngitis is redness, pain, and swelling (inflammation) of the throat (pharynx).  While  pharyngitis can be caused by a bacteria, the most common causes are viral.  Most cases of pharyngitis get better on their own without treatment.  Bacterial pharyngitis is treated with antibiotic medicines. This information is not intended to replace advice given to you by your health care provider. Make sure you discuss any questions you have with your health care provider. Document Released: 09/15/2005 Document Revised: 10/21/2016 Document Reviewed: 10/21/2016 Elsevier Interactive Patient Education  2018 [employer]  

## 2018-03-16 NOTE — Progress Notes (Signed)
HPI:                                                                Meagan Floyd is a 52 y.o. female who presents to [facility] today for sore throat  Sore Throat   This is a new problem. The current episode started yesterday. The problem has been unchanged. Neither side of throat is experiencing more pain than the other. Maximum temperature: tactile. The pain is moderate. Pertinent negatives include no congestion, coughing, swollen glands, trouble swallowing or vomiting. She has had no exposure to strep. She has tried acetaminophen for the symptoms.      Past Medical History:  Diagnosis Date  . Depression   . Depression   . 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 Hypertension   . Microcytic anemia 08/11/2017   Past Surgical History:  Procedure Laterality Date  . ABLATION    . GASTRIC BYPASS    . SHOULDER ARTHROSCOPY    . TUMOR REMOVAL     right hand   Social History   Tobacco Use  . Smoking status: Never Smoker  . Smokeless tobacco: Never Used  Substance Use Topics  . Alcohol use: No   family history includes Bipolar disorder in her sister; Cancer in her other; Dementia in her father; Depression in her mother; Hypertension in her father and mother; Melanoma in her maternal grandmother.    ROS: negative except as noted in the HPI  Medications: Current Outpatient Medications  Medication Sig Dispense Refill  . Butalbital-APAP-Caffeine (FIORICET) 50-300-40 MG CAPS Take 1 tablet up to three times a day as needed for post-spinal tap headache 20 capsule 0  . Calcium-Magnesium-Vitamin D (CALCIUM 500 PO) Take by mouth.    . Cholecalciferol (VITAMIN D) 2000 UNITS CAPS Take by mouth.    . citalopram (CELEXA) 20 MG tablet Take 1 tablet (20 mg total) daily by mouth. 30 tablet 5  . gabapentin (NEURONTIN) 100 MG capsule Take 1-3 capsules (100-300 mg total) by mouth at bedtime. Due for office  visit for further refills 90 capsule 0  . MULTIPLE VITAMIN PO Take by mouth.    . promethazine (PHENERGAN) 12.5 MG tablet Take 1 tablet (12.5 mg total) by mouth every 6 (six) hours as needed for nausea or vomiting. 30 tablet 1   No current facility-administered medications for this visit.    Allergies  Allergen Reactions  . Belsomra [Suvorexant]   . Codeine   . Penicillins        Objective:  BP 118/80   Pulse 85   Temp 98.5 F (36.9 C) (Oral)   Wt 222 lb (100.7 kg)   BMI 38.11 kg/m  Gen:  alert, not ill-appearing, no distress, appropriate for age HEENT: head normocephalic without obvious abnormality, TM's pearly gray and semi-transparent, conjunctiva and cornea clear, tongue with yellowish plaque that can be scraped off with tongue depressor, oropharynx with erythema, unable to visualize the lower half of the tonsils, mallampotti 3, there is tender left anterior cervicla adenopathy, neck supple, trachea midline Pulm: Normal work of breathing, normal phonation, clear to auscultation bilaterally, no wheezes, rales or rhonchi CV: Normal rate, regular rhythm, s1 and s2 distinct, no murmurs, clicks  or rubs  Neuro: alert and oriented x 3, no tremor MSK: extremities atraumatic, normal gait and station Skin: intact, no rashes on exposed skin, no jaundice, no cyanosis    No results found for this or any previous visit (from the past 72 hour(s)). No results found.    Assessment and Plan: 52 y.o. female with   Acute pharyngitis, unspecified etiology - Plan: Culture, Group A Strep, Culture, Group A Strep, azithromycin (ZITHROMAX Z-PAK) 250 MG tablet - no available rapid Strep A kits in office today - Centor score 2 - throat culture pending - will start empiric treatment with Azithromycin (PCN allergy) - counseled on symptomatic care  Patient education and anticipatory guidance given Patient agrees with treatment plan Follow-up as needed if symptoms worsen or fail to  improve  Levonne Hubertharley E. Cummings PA-C

## 2018-03-18 LAB — CULTURE, GROUP A STREP
MICRO NUMBER: 90731505
SPECIMEN QUALITY: ADEQUATE

## 2018-04-27 ENCOUNTER — Other Ambulatory Visit: Payer: Self-pay | Admitting: Physician Assistant

## 2018-04-27 DIAGNOSIS — F331 Major depressive disorder, recurrent, moderate: Secondary | ICD-10-CM

## 2018-05-14 ENCOUNTER — Ambulatory Visit: Payer: 59 | Admitting: Neurology

## 2018-06-03 ENCOUNTER — Other Ambulatory Visit: Payer: Self-pay | Admitting: Physician Assistant

## 2018-06-03 DIAGNOSIS — F331 Major depressive disorder, recurrent, moderate: Secondary | ICD-10-CM

## 2018-07-05 ENCOUNTER — Other Ambulatory Visit: Payer: Self-pay | Admitting: Physician Assistant

## 2018-07-05 DIAGNOSIS — F331 Major depressive disorder, recurrent, moderate: Secondary | ICD-10-CM

## 2018-07-06 ENCOUNTER — Other Ambulatory Visit: Payer: Self-pay | Admitting: Physician Assistant

## 2018-07-06 DIAGNOSIS — F331 Major depressive disorder, recurrent, moderate: Secondary | ICD-10-CM

## 2018-07-13 ENCOUNTER — Ambulatory Visit (INDEPENDENT_AMBULATORY_CARE_PROVIDER_SITE_OTHER): Payer: 59 | Admitting: Physician Assistant

## 2018-07-13 ENCOUNTER — Encounter: Payer: Self-pay | Admitting: Physician Assistant

## 2018-07-13 VITALS — BP 134/85 | HR 73 | Wt 210.0 lb

## 2018-07-13 DIAGNOSIS — F330 Major depressive disorder, recurrent, mild: Secondary | ICD-10-CM | POA: Diagnosis not present

## 2018-07-13 DIAGNOSIS — F411 Generalized anxiety disorder: Secondary | ICD-10-CM | POA: Diagnosis not present

## 2018-07-13 DIAGNOSIS — F410 Panic disorder [episodic paroxysmal anxiety] without agoraphobia: Secondary | ICD-10-CM | POA: Diagnosis not present

## 2018-07-13 MED ORDER — CITALOPRAM HYDROBROMIDE 40 MG PO TABS: 40.0000 mg | ORAL_TABLET | Freq: Every day | ORAL | 3 refills | Status: DC

## 2018-07-13 MED ORDER — ALPRAZOLAM 0.5 MG PO TABS: 0.2500 mg | ORAL_TABLET | Freq: Three times a day (TID) | ORAL | 0 refills | Status: DC | PRN

## 2018-07-13 NOTE — Patient Instructions (Signed)
Panic Attack A panic attack is a sudden episode of severe anxiety, fear, or discomfort that causes physical and emotional symptoms. The attack may be in response to something frightening, or it may occur for no known reason. Symptoms of a panic attack can be similar to symptoms of a heart attack or stroke. It is important to see your health care provider when you have a panic attack so that these conditions can be ruled out. A panic attack is a symptom of another condition. Most panic attacks go away with treatment of the underlying problem. If you have panic attacks often, you may have a condition called panic disorder. What are the causes? A panic attack may be caused by:  An extreme, life-threatening situation, such as a war or natural disaster.  An anxiety disorder, such as post-traumatic stress disorder.  Depression.  Certain medical conditions, including heart problems, neurological conditions, and infections.  Certain over-the-counter and prescription medicines.  Illegal drugs that increase heart rate and blood pressure, such as methamphetamine.  Alcohol.  Supplements that increase anxiety.  Panic disorder.  What increases the risk? You are more likely to develop this condition if:  You have an anxiety disorder.  You have another mental health condition.  You take certain medicines.  You use alcohol, illegal drugs, or other substances.  You are under extreme stress.  A life event is causing increased feelings of anxiety and depression.  What are the signs or symptoms? A panic attack starts suddenly, usually lasts about 20 minutes, and occurs with one or more of the following:  A pounding heart.  A feeling that your heart is beating irregularly or faster than normal (palpitations).  Sweating.  Trembling or shaking.  Shortness of breath or feeling smothered.  Feeling choked.  Chest pain or discomfort.  Nausea or a strange feeling in your  stomach.  Dizziness, feeling lightheaded, or feeling like you might faint.  Chills or hot flashes.  Numbness or tingling in your lips, hands, or feet.  Feeling confused, or feeling that you are not yourself.  Fear of losing control or being emotionally unstable.  Fear of dying.  How is this diagnosed? A panic attack is diagnosed with an assessment by your health care provider. During the assessment your health care provider will ask questions about:  Your history of anxiety, depression, and panic attacks.  Your medical history.  Whether you drink alcohol, use illegal drugs, take supplements, or take medicines. Be honest about your substance use.  Your health care provider may also:  Order blood tests or other kinds of tests to rule out serious medical conditions.  Refer you to a mental health professional for further evaluation.  How is this treated? Treatment depends on the cause of the panic attack:  If the cause is a medical problem, your health care provider will either treat that problem or refer you to a specialist.  If the cause is emotional, you may be given anti-anxiety medicines or referred to a counselor. These medicines may reduce how often attacks happen, reduce how severe the attacks are, and lower anxiety.  If the cause is a medicine, your health care provider may tell you to stop the medicine, change your dose, or take a different medicine.  If the cause is a drug, treatment may involve letting the drug wear off and taking medicine to help the drug leave your body or to counteract its effects. Attacks caused by drug abuse may continue even if you stop using   the drug.  Follow these instructions at home:  Take over-the-counter and prescription medicines only as told by your health care provider.  If you feel anxious, limit your caffeine intake.  Take good care of your physical and mental health by: ? Eating a balanced diet that includes plenty of fresh  fruits and vegetables, whole grains, lean meats, and low-fat dairy. ? Getting plenty of rest. Try to get 7-8 hours of uninterrupted sleep each night. ? Exercising regularly. Try to get 30 minutes of physical activity at least 5 days a week. ? Not smoking. Talk to your health care provider if you need help quitting. ? Limiting alcohol intake to no more than 1 drink a day for nonpregnant women and 2 drinks a day for men. One drink equals 12 oz of beer, 5 oz of wine, or 1 oz of hard liquor.  Keep all follow-up visits as told by your health care provider. This is important. Panic attacks may have underlying physical or emotional problems that take time to accurately diagnose. Contact a health care provider if:  Your symptoms do not improve, or they get worse.  You are not able to take your medicine as prescribed because of side effects. Get help right away if:  You have serious thoughts about hurting yourself or others.  You have symptoms of a panic attack. Do not drive yourself to the hospital. Have someone else drive you or call an ambulance. If you ever feel like you may hurt yourself or others, or you have thoughts about taking your own life, get help right away. You can go to your nearest emergency department or call:  Your local emergency services (911 in the [place]  A suicide crisis helpline, such as the National Suicide Prevention Lifeline at 1-800-273-8255. This is open 24 hours a day.  Summary  A panic attack is a sign of a serious health or mental health condition. Get help right away. Do not drive yourself to the hospital. Have someone else drive you or call an ambulance.  Always see a health care provider to have the reasons for the panic attack correctly diagnosed.  If your panic attack was caused by a physical problem, follow your health care provider's suggestions for medicine, referral to a specialist, and lifestyle changes.  If your panic attack was caused by an  emotional problem, follow through with counseling from a qualified mental health specialist.  If you feel like you may hurt yourself or others, call 911 and get help right away. This information is not intended to replace advice given to you by your health care provider. Make sure you discuss any questions you have with your health care provider. Document Released: 09/15/2005 Document Revised: 10/24/2016 Document Reviewed: 10/24/2016 Elsevier Interactive Patient Education  2018 Elsevier Inc.  

## 2018-07-13 NOTE — Progress Notes (Signed)
HPI:                                                                Meagan Floyd is a 52 y.o. female who presents to [facility]: Primary Care Sports Medicine today for anxiety   Reports waking up with "overwhelming fear" and feeling panicked associated with heart palpitations 2-3 days per week for the last 2 months, gradually worsening.  Reports sleep quality is "not great." Endorses multiple nighttime awakenings every night. She has been taking Celexa 20 mg without difficulty for several years.  Denies drug or alcohol use.   Denies symptoms of mania/hypomania. Denies suicidal thinking. Denies auditory/visual hallucinations. Previous meds: Zoloft, Paxil Previous sleep meds: Unisom, Tylenol PM  Depression screen [facility] 2/9 07/13/2018 09/17/2017 08/10/2017  Decreased Interest 1 0 1  Down, Depressed, Hopeless 1 0 2  PHQ - 2 Score 2 0 3  Altered sleeping 1 3 3   Tired, decreased energy 3 3 3   Change in appetite 0 1 1  Feeling bad or failure about yourself  0 0 1  Trouble concentrating 2 0 1  Moving slowly or fidgety/restless 1 0 0  Suicidal thoughts 0 0 0  PHQ-9 Score 9 7 12   Difficult doing work/chores Somewhat difficult Not difficult at all -    GAD 7 : Generalized Anxiety Score 07/13/2018 09/17/2017 08/10/2017  Nervous, Anxious, on Edge 2 1 1   Control/stop worrying 1 0 1  Worry too much - different things 1 0 1  Trouble relaxing 1 1 1   Restless 1 0 1  Easily annoyed or irritable 1 - 2  Afraid - awful might happen 3 0 0  Total GAD 7 Score 10 - 7  Anxiety Difficulty Somewhat difficult - -      Past Medical History:  Diagnosis Date  . Depression   . Depression   . 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 Hypertension   . Microcytic anemia 08/11/2017   Past Surgical History:  Procedure Laterality Date  . ABLATION    . GASTRIC BYPASS    . SHOULDER ARTHROSCOPY    . TUMOR REMOVAL     right hand   Social History    Tobacco Use  . Smoking status: Never Smoker  . Smokeless tobacco: Never Used  Substance Use Topics  . Alcohol use: No   family history includes Bipolar disorder in her sister; Cancer in her other; Dementia in her father; Depression in her mother; Hypertension in her father and mother; Melanoma in her maternal grandmother.    ROS: negative except as noted in the HPI  Medications: Current Outpatient Medications  Medication Sig Dispense Refill  . Calcium-Magnesium-Vitamin D (CALCIUM 500 PO) Take by mouth.    . Cholecalciferol (VITAMIN D) 2000 UNITS CAPS Take by mouth.    . citalopram (CELEXA) 40 MG tablet Take 1 tablet (40 mg total) by mouth at bedtime. Due for follow up visit 60 tablet 3  . loratadine (CLARITIN) 10 MG tablet Take 10 mg by mouth daily.    . MULTIPLE VITAMIN PO Take by mouth.    . promethazine (PHENERGAN) 12.5 MG tablet Take 1 tablet (12.5 mg total) by mouth every 6 (six) hours as needed for nausea  or vomiting. 30 tablet 1  . ALPRAZolam (XANAX) 0.5 MG tablet Take 0.5-1 tablets (0.25-0.5 mg total) by mouth every 8 (eight) hours as needed for anxiety (panic). 20 tablet 0   No current facility-administered medications for this visit.    Allergies  Allergen Reactions  . Belsomra [Suvorexant] Other (See Comments)    paresthesias  . Codeine Nausea Only  . Penicillins Rash       Objective:  BP (!) 140/93   Pulse 73   Wt 210 lb (95.3 kg)   BMI 36.05 kg/m  Gen:  alert, not ill-appearing, no distress, appropriate for age HEENT: head normocephalic without obvious abnormality, conjunctiva and cornea clear, wearing glasses, trachea midline Pulm: Normal work of breathing, normal phonation Neuro: alert and oriented x 3, no tremor MSK: extremities atraumatic, normal gait and station Skin: intact, no rashes on exposed skin, no jaundice, no cyanosis Psych: well-groomed, cooperative, good eye contact, depressed mood, affect mood-congruent, speech is articulate, and  thought processes clear and goal-directed, no SI    No results found for this or any previous visit (from the past 72 hour(s)). No results found.    Assessment and Plan: 52 y.o. female with   .Diagnoses and all orders for this visit:  Generalized anxiety disorder with panic attacks -     citalopram (CELEXA) 40 MG tablet; Take 1 tablet (40 mg total) by mouth at bedtime. Due for follow up visit -     ALPRAZolam (XANAX) 0.5 MG tablet; Take 0.5-1 tablets (0.25-0.5 mg total) by mouth every 8 (eight) hours as needed for anxiety (panic).  Mild episode of recurrent major depressive disorder (HCC)   PHQ9-=9, mildly moderate GAD7=10, moderate Increasing Celexa to 40 mg nightly Low-dose Alprazolam prn for panic attacks.     Patient education and anticipatory guidance given Patient agrees with treatment plan Follow-up in 1 month or sooner as needed if symptoms worsen or fail to improve  Levonne Hubert PA-C

## 2018-08-08 ENCOUNTER — Other Ambulatory Visit: Payer: Self-pay | Admitting: Physician Assistant

## 2018-08-08 DIAGNOSIS — F331 Major depressive disorder, recurrent, moderate: Secondary | ICD-10-CM

## 2018-08-10 ENCOUNTER — Encounter: Payer: Self-pay | Admitting: Physician Assistant

## 2018-08-10 ENCOUNTER — Ambulatory Visit (INDEPENDENT_AMBULATORY_CARE_PROVIDER_SITE_OTHER): Payer: 59 | Admitting: Physician Assistant

## 2018-08-10 VITALS — BP 130/71 | HR 65 | Wt 209.0 lb

## 2018-08-10 DIAGNOSIS — Z636 Dependent relative needing care at home: Secondary | ICD-10-CM | POA: Diagnosis not present

## 2018-08-10 DIAGNOSIS — F410 Panic disorder [episodic paroxysmal anxiety] without agoraphobia: Secondary | ICD-10-CM

## 2018-08-10 DIAGNOSIS — F411 Generalized anxiety disorder: Secondary | ICD-10-CM | POA: Diagnosis not present

## 2018-08-10 MED ORDER — CITALOPRAM HYDROBROMIDE 40 MG PO TABS: 40.0000 mg | ORAL_TABLET | Freq: Every day | ORAL | 1 refills | Status: DC

## 2018-08-10 MED ORDER — BUSPIRONE HCL 7.5 MG PO TABS: 7.5000 mg | ORAL_TABLET | Freq: Three times a day (TID) | ORAL | 1 refills | Status: DC | PRN

## 2018-08-10 NOTE — Progress Notes (Signed)
HPI:                                                                Kathrin GreathouseMichele Muramoto is a 52 y.o. female who [facility] [facility] today for anxiety follow-up  Interval history Has been taking Celexa for years for GAD. At last OV dose was increased to 40 mg. She has been taking this for 1 month States she is still having feeling of impending doom in her stomach. She found the Alprazolam sedating so she is only taking this when she is at home for anxiety in the evenings. She has not noticed much of a change in her symptoms otherwise She is under a lot of stress. Her step-father has terminal cancer and is in hospice care. Her father has Alzheimer's. She is the healthcare POA for both  Reports waking up with "overwhelming fear" and feeling panicked associated with heart palpitations 2-3 days per week for the last 2 months, gradually worsening.  Reports sleep quality is "not great." Endorses multiple nighttime awakenings every night. She has been takingCelexa 20 mgwithout difficulty for several years.  Denies drug or alcohol use.  Denies symptoms of mania/hypomania. Denies suicidal thinking. Denies auditory/visual hallucinations. Previous meds: Zoloft, Paxil Previous sleep meds: Unisom, Tylenol PM  Dep[facility] screen [facility] 2/9 08/10/2018 07/13/2018 09/17/2017 08/10/2017  Decreased Interest 1 1 0 1  Down, Depressed, Hopeless 1 1 0 2  PHQ - 2 Score 2 2 0 3  Altered sleeping 2 1 3 3   Tired, decreased energy 3 3 3 3   Change in appetite 0 0 1 1  Feeling bad or failure about yourself  0 0 0 1  Trouble concentrating 2 2 0 1  Moving slowly or fidgety/restless 0 1 0 0  Suicidal thoughts 0 0 0 0  PHQ-9 Score 9 9 7 12   Difficult doing work/chores - Somewhat difficult Not difficult at all -    GAD 7 : Generalized Anxiety Score 08/10/2018 07/13/2018 09/17/2017 08/10/2017  Nervous, Anxious, on Edge 2 2 1 1   Control/stop worrying 2 1 0 1  Worry  too much - different things 2 1 0 1  Trouble relaxing 2 1 1 1   Restless 0 1 0 1  Easily annoyed or irritable 1 1 - 2  Afraid - awful might happen 2 3 0 0  Total GAD 7 Score 11 10 - 7  Anxiety Difficulty - Somewhat difficult - -      Past Medical History:  Diagnosis Date  . Depression   . Depression   . 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 Hypertension   . Microcytic anemia 08/11/2017   Past Surgical History:  Procedure Laterality Date  . ABLATION    . GASTRIC BYPASS    . SHOULDER ARTHROSCOPY    . TUMOR REMOVAL     right hand   Social History   Tobacco Use  . Smoking status: Never Smoker  . Smokeless tobacco: Never Used  Substance Use Topics  . Alcohol use: No   family history includes Bipolar disorder in her sister; Cancer in her other; Dementia in her father; Depression in her mother; Hypertension in her father and mother; Melanoma in her  maternal grandmother.    ROS: negative except as noted in the HPI  Medications: Current Outpatient Medications  Medication Sig Dispense Refill  . ALPRAZolam (XANAX) 0.5 MG tablet Take 0.5-1 tablets (0.25-0.5 mg total) by mouth every 8 (eight) hours as needed for anxiety (panic). 20 tablet 0  . Calcium-Magnesium-Vitamin D (CALCIUM 500 PO) Take by mouth.    . Cholecalciferol (VITAMIN D) 2000 UNITS CAPS Take by mouth.    . citalopram (CELEXA) 40 MG tablet Take 1 tablet (40 mg total) by mouth at bedtime. Due for follow up visit 60 tablet 3  . MULTIPLE VITAMIN PO Take by mouth.    . promethazine (PHENERGAN) 12.5 MG tablet Take 1 tablet (12.5 mg total) by mouth every 6 (six) hours as needed for nausea or vomiting. 30 tablet 1   No current facility-administered medications for this visit.    Allergies  Allergen Reactions  . Belsomra [Suvorexant] Other (See Comments)    paresthesias  . Codeine Nausea Only  . Penicillins Rash       Objective:  BP 130/71   Pulse 65   Wt 209 lb (94.8 kg)    BMI 35.87 kg/m  Gen:  alert, not ill-appearing, no distress, appropriate for age HEENT: head normocephalic without obvious abnormality, conjunctiva and cornea clear, trachea midline Pulm: Normal work of breathing, normal phonation Neuro: alert and oriented x 3, no tremor MSK: extremities atraumatic, normal gait and station Skin: intact, no rashes on exposed skin, no jaundice, no cyanosis Psych: well-groomed, cooperative, good eye contact, depressed mood, affect mood-congruent, speech is articulate, and thought processes clear and goal-directed, no SI   No results found for this or any previous visit (from the past 72 hour(s)). No results found.    Assessment and Plan: 52 y.o. female with  .Loann was seen today for follow-up.  Diagnoses and all orders for this visit:  Generalized anxiety disorder with panic attacks -     citalopram (CELEXA) 40 MG tablet; Take 1 tablet (40 mg total) by mouth at bedtime.  Caregiver stress  Other orders -     busPIRone (BUSPAR) 7.5 MG tablet; Take 1 tablet (7.5 mg total) by mouth 3 (three) times daily as needed.   Cont Celexa 40 mg QHS Discussed option to cross-taper and start a new medication. Patient deferred Alprazolam is too sedating for breakthrough anxiety at work Will trial Buspar 7.5 mg prn. Counseled that she may take this up to tid prn Provided with information on Alzheimer's caregiver support group Encouraged her to consider reaching out to employer [employer] for  counseling Send MyChart message in 1 month   Patient education and anticipatory guidance given Patient agrees with treatment plan Follow-up in 3 months or sooner as needed if symptoms worsen or fail to improve  Levonne Hubert PA-C

## 2018-08-10 NOTE — Patient Instructions (Addendum)
Send me a MyChart message in about 4 weeks  Support Group 2nd Mond[employer] Durwin Glaze [phone] [email or web address]

## 2018-08-16 ENCOUNTER — Ambulatory Visit (INDEPENDENT_AMBULATORY_CARE_PROVIDER_SITE_OTHER): Payer: 59

## 2018-08-16 ENCOUNTER — Encounter: Payer: Self-pay | Admitting: Physician Assistant

## 2018-08-16 ENCOUNTER — Ambulatory Visit (INDEPENDENT_AMBULATORY_CARE_PROVIDER_SITE_OTHER): Payer: 59 | Admitting: Physician Assistant

## 2018-08-16 VITALS — BP 142/87 | HR 81 | Wt 209.0 lb

## 2018-08-16 DIAGNOSIS — M25572 Pain in left ankle and joints of left foot: Secondary | ICD-10-CM

## 2018-08-16 MED ORDER — NAPROXEN 375 MG PO TABS: 375.0000 mg | ORAL_TABLET | Freq: Two times a day (BID) | ORAL | 0 refills | Status: AC

## 2018-08-16 NOTE — Progress Notes (Signed)
HPI:                                                                Meagan Floyd is a 52 y.o. female who presents to [facility]: Primary Care Sports Medicine today for left foot/ankle pain/swelling  Ankle Pain   The incident occurred 2 days ago. There was no injury mechanism. The pain is present in the left foot and left ankle. The quality of the pain is described as shooting. The pain is moderate. The pain has been fluctuating since onset. Associated symptoms include an inability to bear weight. She reports no foreign bodies present. The symptoms are aggravated by weight bearing. She has tried NSAIDs for the symptoms. The treatment provided mild relief.  History of left ankle injury in 2013   Past Medical History:  Diagnosis Date  . Depression   . Depression   . 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 Hypertension   . Microcytic anemia 08/11/2017   Past Surgical History:  Procedure Laterality Date  . ABLATION    . GASTRIC BYPASS    . SHOULDER ARTHROSCOPY    . TUMOR REMOVAL     right hand   Social History   Tobacco Use  . Smoking status: Never Smoker  . Smokeless tobacco: Never Used  Substance Use Topics  . Alcohol use: No   family history includes Bipolar disorder in her sister; Cancer in her other; Dementia in her father; Depression in her mother; Hypertension in her father and mother; Melanoma in her maternal grandmother.    ROS: Review of Systems  Constitutional: Negative for fever and malaise/fatigue.  Musculoskeletal: Positive for joint pain. Negative for falls.  Neurological: Negative for sensory change and focal weakness.     Medications: Current Outpatient Medications  Medication Sig Dispense Refill  . ALPRAZolam (XANAX) 0.5 MG tablet Take 0.5-1 tablets (0.25-0.5 mg total) by mouth every 8 (eight) hours as needed for anxiety (panic). 20 tablet 0  . busPIRone (BUSPAR) 7.5 MG tablet Take 1 tablet (7.5  mg total) by mouth 3 (three) times daily as needed. 90 tablet 1  . Calcium-Magnesium-Vitamin D (CALCIUM 500 PO) Take by mouth.    . Cholecalciferol (VITAMIN D) 2000 UNITS CAPS Take by mouth.    . citalopram (CELEXA) 40 MG tablet Take 1 tablet (40 mg total) by mouth at bedtime. 90 tablet 1  . MULTIPLE VITAMIN PO Take by mouth.    . promethazine (PHENERGAN) 12.5 MG tablet Take 1 tablet (12.5 mg total) by mouth every 6 (six) hours as needed for nausea or vomiting. 30 tablet 1   No current facility-administered medications for this visit.    Allergies  Allergen Reactions  . Belsomra [Suvorexant] Other (See Comments)    paresthesias  . Codeine Nausea Only  . Penicillins Rash       Objective:  BP (!) 142/87   Pulse 81   Wt 209 lb (94.8 kg)   BMI 35.87 kg/m  Gen:  alert, not ill-appearing, no distress, appropriate for age, obese female HEENT: head normocephalic without obvious abnormality, conjunctiva and cornea clear, trachea midline Pulm: Normal work of breathing, normal phonation Neuro: alert and oriented x 3, no tremor MSK: antalgic, limping gait and  station Left ankle: visibly swollen, no joint laxity, negative squeeze test, pain reproduced with eversion and inversion Skin: intact, no rashes on exposed skin, no jaundice, no cyanosis    No results found for this or any previous visit (from the past 72 hour(s)). No results found.    Assessment and Plan: 52 y.o. female with   .Meagan Floyd was seen today for foot injury.  Diagnoses and all orders for this visit:  Acute left ankle pain -     DG Ankle Complete Left -     naproxen (NAPROSYN) 375 MG tablet; Take 1 tablet (375 mg total) by mouth 2 (two) times daily with a meal for 14 days. Than 1 tab PO bid prn   Sudden onset, atraumatic left ankle pain with difficulty weight bearing Differential includes OA, gout, pseudogout, tendonitis X-ray pending Conservative management with NSAID and PRICE   Patient education and  anticipatory guidance given Patient agrees with treatment plan Follow-up in 2-4 weeks with Sports Medicine if symptoms worsen or fail to improve  Levonne Hubertharley E.  PA-C

## 2018-08-16 NOTE — Patient Instructions (Signed)
Ankle Pain Many things can cause ankle pain, including an injury to the area and overuse of the ankle.The ankle joint holds your body weight and allows you to move around. Ankle pain can occur on either side or the back of one ankle or both ankles. Ankle pain may be sharp and burning or dull and aching. There may be tenderness, stiffness, redness, or warmth around the ankle. Follow these instructions at home: Activity  Rest your ankle as told by your health care provider. Avoid any activities that cause ankle pain.  Do exercises as told by your health care provider.  Ask your health care provider if you can drive. Using a brace, a bandage, or crutches  If you were given a brace: ? Wear it as told by your health care provider. ? Remove it when you take a bath or a shower. ? Try not to move your ankle very much, but wiggle your toes from time to time. This helps to prevent swelling.  If you were given an elastic bandage: ? Remove it when you take a bath or a shower. ? Try not to move your ankle very much, but wiggle your toes from time to time. This helps to prevent swelling. ? Adjust the bandage to make it more comfortable if it feels too tight. ? Loosen the bandage if you have numbness or tingling in your foot or if your foot turns cold and blue.  If you have crutches, use them as told by your health care provider. Continue to use them until you can walk without feeling pain in your ankle. Managing pain, stiffness, and swelling  Raise (elevate) your ankle above the level of your heart while you are sitting or lying down.  If directed, apply ice to the area: ? Put ice in a plastic bag. ? Place a towel between your skin and the bag. ? Leave the ice on for 20 minutes, 2-3 times per day. General instructions  Keep all follow-up visits as told by your health care provider. This is important.  Record this information that may be helpful for you and your health care provider: ? How  often you have ankle pain. ? Where the pain is located. ? What the pain feels like.  Take over-the-counter and prescription medicines only as told by your health care provider. Contact a health care provider if:  Your pain gets worse.  Your pain is not relieved with medicines.  You have a fever or chills.  You are having more trouble with walking.  You have new symptoms. Get help right away if:  Your foot, leg, toes, or ankle tingles or becomes numb.  Your foot, leg, toes, or ankle becomes swollen.  Your foot, leg, toes, or ankle turns pale or blue. This information is not intended to replace advice given to you by your health care provider. Make sure you discuss any questions you have with your health care provider. Document Released: 03/05/2010 Document Revised: 05/16/2016 Document Reviewed: 04/17/2015 Elsevier Interactive Patient Education  2018 Elsevier Inc.  

## 2018-09-14 ENCOUNTER — Other Ambulatory Visit: Payer: Self-pay | Admitting: Physician Assistant

## 2019-01-24 ENCOUNTER — Telehealth: Payer: Self-pay | Admitting: Physician Assistant

## 2019-01-24 DIAGNOSIS — D509 Iron deficiency anemia, unspecified: Secondary | ICD-10-CM

## 2019-01-24 DIAGNOSIS — K912 Postsurgical malabsorption, not elsewhere classified: Secondary | ICD-10-CM

## 2019-01-24 DIAGNOSIS — R700 Elevated erythrocyte sedimentation rate: Secondary | ICD-10-CM

## 2019-01-24 DIAGNOSIS — E559 Vitamin D deficiency, unspecified: Secondary | ICD-10-CM

## 2019-01-24 DIAGNOSIS — Z13220 Encounter for screening for lipoid disorders: Secondary | ICD-10-CM

## 2019-01-24 DIAGNOSIS — Z903 Acquired absence of stomach [part of]: Secondary | ICD-10-CM

## 2019-01-24 NOTE — Telephone Encounter (Signed)
Please schedule patient for follow-up e-visit for anxiety She also needs labs for anemia/vitamin deficiency follow-up

## 2019-01-25 NOTE — Telephone Encounter (Signed)
Scheduled -EH/RMA  

## 2019-01-26 ENCOUNTER — Encounter: Payer: Self-pay | Admitting: Physician Assistant

## 2019-01-26 ENCOUNTER — Ambulatory Visit (INDEPENDENT_AMBULATORY_CARE_PROVIDER_SITE_OTHER): Payer: 59 | Admitting: Physician Assistant

## 2019-01-26 VITALS — Ht 64.0 in | Wt 210.0 lb

## 2019-01-26 DIAGNOSIS — F411 Generalized anxiety disorder: Secondary | ICD-10-CM

## 2019-01-26 DIAGNOSIS — F331 Major depressive disorder, recurrent, moderate: Secondary | ICD-10-CM

## 2019-01-26 DIAGNOSIS — E559 Vitamin D deficiency, unspecified: Secondary | ICD-10-CM

## 2019-01-26 DIAGNOSIS — D509 Iron deficiency anemia, unspecified: Secondary | ICD-10-CM

## 2019-01-26 DIAGNOSIS — K912 Postsurgical malabsorption, not elsewhere classified: Secondary | ICD-10-CM

## 2019-01-26 DIAGNOSIS — Z636 Dependent relative needing care at home: Secondary | ICD-10-CM

## 2019-01-26 DIAGNOSIS — Z903 Acquired absence of stomach [part of]: Secondary | ICD-10-CM

## 2019-01-26 DIAGNOSIS — F410 Panic disorder [episodic paroxysmal anxiety] without agoraphobia: Secondary | ICD-10-CM

## 2019-01-26 DIAGNOSIS — R700 Elevated erythrocyte sedimentation rate: Secondary | ICD-10-CM

## 2019-01-26 MED ORDER — CITALOPRAM HYDROBROMIDE 10 MG PO TABS: ORAL_TABLET | ORAL | 0 refills | Status: DC

## 2019-01-26 MED ORDER — BUSPIRONE HCL 10 MG PO TABS: 10.0000 mg | ORAL_TABLET | Freq: Three times a day (TID) | ORAL | 1 refills | Status: DC | PRN

## 2019-01-26 MED ORDER — DULOXETINE HCL 20 MG PO CPEP: 20.0000 mg | ORAL_CAPSULE | Freq: Every day | ORAL | 1 refills | Status: DC

## 2019-01-26 NOTE — Progress Notes (Signed)
Virtual Visit via Video Note  I connected with Meagan Floyd on 01/26/19 at  8:30 AM EDT by a video enabled telemedicine application and verified that I am speaking with the correct person using two identifiers.   I discussed the limitations of evaluation and management by telemedicine and the availability of in person appointments. The patient expressed understanding and agreed to proceed.  History of Present Illness: HPI:                                                                Meagan Floyd is a 53 y.o. female   CC: anxiety follow-up  Mood - depressed, "I just want to be in bed," no improvement with increased dose of Celexa, 6 months ago Anxiety - she tried Buspar 7.5 mg several times and did not notice a change Sleep - practicing sleep hygiene, no sleep aids, multiple nighttime awakenings, unable to fall back to sleep for almost 2 hours, does not feel rested Caregiver stress - Reports step-father passed away in 2023/10/05. Step-mother recently diagnosed with terminal cancer. Father's dementia is worsening.   Previous meds: Zoloft, Paxil, Alprazolam (sedating) Previous sleep meds: Unisom, Tylenol PM  Depression screen [facility] 2/9 01/26/2019 08/10/2018 07/13/2018 09/17/2017 08/10/2017  Decreased Interest 2 1 1  0 1  Down, Depressed, Hopeless 3 1 1  0 2  PHQ - 2 Score 5 2 2  0 3  Altered sleeping 3 2 1 3 3   Tired, decreased energy 3 3 3 3 3   Change in appetite 3 0 0 1 1  Feeling bad or failure about yourself  0 0 0 0 1  Trouble concentrating 2 2 2  0 1  Moving slowly or fidgety/restless 0 0 1 0 0  Suicidal thoughts 0 0 0 0 0  PHQ-9 Score 16 9 9 7 12   Difficult doing work/chores Very difficult Somewhat difficult Somewhat difficult Not difficult at all -    GAD 7 : Generalized Anxiety Score 01/26/2019 08/10/2018 07/13/2018 09/17/2017  Nervous, Anxious, on Edge 2 2 2 1   Control/stop worrying 2 2 1  0  Worry too much - different things 2 2 1  0  Trouble relaxing 2 2 1 1   Restless 1 0  1 0  Easily annoyed or irritable 2 1 1  -  Afraid - awful might happen 1 2 3  0  Total GAD 7 Score 12 11 10  -  Anxiety Difficulty Not difficult at all - Somewhat difficult -      Past Medical History:  Diagnosis Date  . Depression   . Depression   . 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 Hypertension   . Microcytic anemia 08/11/2017   Past Surgical History:  Procedure Laterality Date  . ABLATION    . GASTRIC BYPASS    . SHOULDER ARTHROSCOPY    . TUMOR REMOVAL     right hand   Social History   Tobacco Use  . Smoking status: Never Smoker  . Smokeless tobacco: Never Used  Substance Use Topics  . Alcohol use: No   family history includes Bipolar disorder in her sister; Cancer in an other family member; Dementia in her father; Depression in her mother; Hypertension in her father and mother; Melanoma in her maternal grandmother.  ROS: negative except as noted in the HPI  Medications: Current Outpatient Medications  Medication Sig Dispense Refill  . citalopram (CELEXA) 40 MG tablet Take 1 tablet (40 mg total) by mouth at bedtime. 90 tablet 1  . ALPRAZolam (XANAX) 0.5 MG tablet Take 0.5-1 tablets (0.25-0.5 mg total) by mouth every 8 (eight) hours as needed for anxiety (panic). (Patient not taking: Reported on 01/26/2019) 20 tablet 0  . Calcium-Magnesium-Vitamin D (CALCIUM 500 PO) Take by mouth.    . Cholecalciferol (VITAMIN D) 2000 UNITS CAPS Take by mouth.    . MULTIPLE VITAMIN PO Take by mouth.     No current facility-administered medications for this visit.    Allergies  Allergen Reactions  . Belsomra [Suvorexant] Other (See Comments)    paresthesias  . Codeine Nausea Only  . Penicillins Rash       Objective:  Ht 5\' 4"  (1.626 m)   Wt 210 lb (95.3 kg)   LMP 01/27/2013   BMI 36.05 kg/m  Gen:  alert, not ill-appearing, no distress, appropriate for age HEENT: head normocephalic without obvious abnormality, conjunctiva and  cornea clear, wearing glasses, trachea midline Pulm: Normal work of breathing, normal phonation Neuro: alert and oriented x 3 Psych: cooperative, depressed mood, affect mood-congruent, speech is articulate, normal rate and volume; thought processes clear and goal-directed, normal judgment, good insight  BP Readings from Last 3 Encounters:  08/16/18 (!) 142/87  08/10/18 130/71  07/13/18 134/85   Pulse Readings from Last 3 Encounters:  08/16/18 81  08/10/18 65  07/13/18 73     Assessment and Plan: 53 y.o. female with   .[name] was seen today for anxiety.  Diagnoses and all orders for this visit:  Generalized anxiety disorder with panic attacks -     citalopram (CELEXA) 10 MG tablet; Take 2 tabs PO QHS x 1 week, then 1 tab PO QHS x 1 week, then stop -     busPIRone (BUSPAR) 10 MG tablet; Take 1 tablet (10 mg total) by mouth 3 (three) times daily as needed (anxiety/panic). -     DULoxetine (CYMBALTA) 20 MG capsule; Take 1 capsule (20 mg total) by mouth daily. Take 1 cap PO QD x 1 week, then 2 caps PO QD -     Ambulatory referral to Psychology  Caregiver stress -     citalopram (CELEXA) 10 MG tablet; Take 2 tabs PO QHS x 1 week, then 1 tab PO QHS x 1 week, then stop -     busPIRone (BUSPAR) 10 MG tablet; Take 1 tablet (10 mg total) by mouth 3 (three) times daily as needed (anxiety/panic). -     DULoxetine (CYMBALTA) 20 MG capsule; Take 1 capsule (20 mg total) by mouth daily. Take 1 cap PO QD x 1 week, then 2 caps PO QD -     Ambulatory referral to Psychology  Moderate episode of recurrent major depressive disorder (HCC) -     citalopram (CELEXA) 10 MG tablet; Take 2 tabs PO QHS x 1 week, then 1 tab PO QHS x 1 week, then stop -     DULoxetine (CYMBALTA) 20 MG capsule; Take 1 capsule (20 mg total) by mouth daily. Take 1 cap PO QD x 1 week, then 2 caps PO QD -     Ambulatory referral to Psychology   PHQ9=16, no acute safety issues, worsened from 9 GAD7=12, unchanged Has been  taking Celexa for several years, dose was increased about 7 months ago, symptoms have gradually worsened.  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 The patient was provided an opportunity to ask questions and all were answered. The patient agreed with the plan and demonstrated an understanding of the instructions.   The patient was advised to call back or seek an in-person evaluation if the symptoms worsen or if the condition fails to improve as anticipated.  I provided 15 minutes of non-face-to-face time during this encounter.   Carlis Stable, [place]

## 2019-02-05 ENCOUNTER — Other Ambulatory Visit: Payer: Self-pay | Admitting: Physician Assistant

## 2019-02-05 DIAGNOSIS — F410 Panic disorder [episodic paroxysmal anxiety] without agoraphobia: Secondary | ICD-10-CM

## 2019-02-17 ENCOUNTER — Other Ambulatory Visit: Payer: Self-pay | Admitting: Physician Assistant

## 2019-02-17 DIAGNOSIS — F410 Panic disorder [episodic paroxysmal anxiety] without agoraphobia: Secondary | ICD-10-CM

## 2019-02-17 DIAGNOSIS — Z636 Dependent relative needing care at home: Secondary | ICD-10-CM

## 2019-02-17 DIAGNOSIS — F411 Generalized anxiety disorder: Secondary | ICD-10-CM

## 2019-02-17 DIAGNOSIS — F331 Major depressive disorder, recurrent, moderate: Secondary | ICD-10-CM

## 2019-02-25 ENCOUNTER — Ambulatory Visit: Payer: 59 | Admitting: Physician Assistant

## 2019-03-09 LAB — LIPID PANEL W/REFLEX DIRECT LDL
Cholesterol: 200 mg/dL — ABNORMAL HIGH (ref <200)
HDL: 65 mg/dL (ref >=50)
LDL Cholesterol (Calc): 114 mg/dL (calc) — ABNORMAL HIGH
Non-HDL Cholesterol (Calc): 135 mg/dL (calc) — ABNORMAL HIGH (ref <130)
Total CHOL/HDL Ratio: 3.1 (calc) (ref <5.0)
Triglycerides: 106 mg/dL (ref <150)

## 2019-03-12 LAB — VITAMIN A: Vitamin A (Retinoic Acid): 35 ug/dL — ABNORMAL LOW (ref 38–98)

## 2019-03-12 LAB — COMPLETE METABOLIC PANEL WITH GFR
AG Ratio: 1.5 (calc) (ref 1.0–2.5)
ALT: 8 U/L (ref 6–29)
AST: 13 U/L (ref 10–35)
Albumin: 3.8 g/dL (ref 3.6–5.1)
Alkaline phosphatase (APISO): 94 U/L (ref 37–153)
BUN: 18 mg/dL (ref 7–25)
CO2: 28 mmol/L (ref 20–32)
Calcium: 8.9 mg/dL (ref 8.6–10.4)
Chloride: 106 mmol/L (ref 98–110)
Creat: 0.64 mg/dL (ref 0.50–1.05)
GFR, Est African American: 121 mL/min/{1.73_m2} (ref >=60)
GFR, Est Non African American: 104 mL/min/{1.73_m2} (ref >=60)
Globulin: 2.6 g/dL (calc) (ref 1.9–3.7)
Glucose, Bld: 90 mg/dL (ref 65–99)
Potassium: 4.7 mmol/L (ref 3.5–5.3)
Sodium: 142 mmol/L (ref 135–146)
Total Bilirubin: 0.3 mg/dL (ref 0.2–1.2)
Total Protein: 6.4 g/dL (ref 6.1–8.1)

## 2019-03-12 LAB — CBC (INCLUDES DIFF/PLT) WITH PATHOLOGIST REVIEW
Absolute Monocytes: 421 cells/uL (ref 200–950)
Basophils Absolute: 49 cells/uL (ref 0–200)
Basophils Relative: 0.6 %
Eosinophils Absolute: 81 cells/uL (ref 15–500)
Eosinophils Relative: 1 %
HCT: 32.3 % — ABNORMAL LOW (ref 35.0–45.0)
Hemoglobin: 9.2 g/dL — ABNORMAL LOW (ref 11.7–15.5)
Lymphs Abs: 2819 cells/uL (ref 850–3900)
MCH: 20.6 pg — ABNORMAL LOW (ref 27.0–33.0)
MCHC: 28.5 g/dL — ABNORMAL LOW (ref 32.0–36.0)
MCV: 72.3 fL — ABNORMAL LOW (ref 80.0–100.0)
MPV: 10.4 fL (ref 7.5–12.5)
Monocytes Relative: 5.2 %
Neutro Abs: 4730 cells/uL (ref 1500–7800)
Neutrophils Relative %: 58.4 %
Platelets: 498 10*3/uL — ABNORMAL HIGH (ref 140–400)
RBC: 4.47 10*6/uL (ref 3.80–5.10)
RDW: 17.9 % — ABNORMAL HIGH (ref 11.0–15.0)
Total Lymphocyte: 34.8 %
WBC: 8.1 10*3/uL (ref 3.8–10.8)

## 2019-03-12 LAB — PREALBUMIN: Prealbumin: 21 mg/dL (ref 17–34)

## 2019-03-12 LAB — IRON,TIBC AND FERRITIN PANEL
%SAT: 4 % (calc) — ABNORMAL LOW (ref 16–45)
Ferritin: 3 ng/mL — ABNORMAL LOW (ref 16–232)
Iron: 19 ug/dL — ABNORMAL LOW (ref 45–160)
TIBC: 447 mcg/dL (calc) (ref 250–450)

## 2019-03-12 LAB — SEDIMENTATION RATE: Sed Rate: 48 mm/h — ABNORMAL HIGH (ref 0–20)

## 2019-03-12 LAB — PTH, INTACT AND CALCIUM
Calcium: 8.9 mg/dL (ref 8.6–10.4)
PTH: 71 pg/mL — ABNORMAL HIGH (ref 14–64)

## 2019-03-12 LAB — VITAMIN B12: Vitamin B-12: 298 pg/mL (ref 200–1100)

## 2019-03-12 LAB — FOLATE RBC: RBC Folate: 927 ng/mL RBC (ref >280)

## 2019-03-12 LAB — C-REACTIVE PROTEIN: CRP: 22.8 mg/L — ABNORMAL HIGH (ref <8.0)

## 2019-03-12 LAB — VITAMIN B1: Vitamin B1 (Thiamine): 13 nmol/L (ref 8–30)

## 2019-03-13 ENCOUNTER — Encounter: Payer: Self-pay | Admitting: Physician Assistant

## 2019-03-13 DIAGNOSIS — E211 Secondary hyperparathyroidism, not elsewhere classified: Secondary | ICD-10-CM | POA: Insufficient documentation

## 2019-03-13 DIAGNOSIS — E509 Vitamin A deficiency, unspecified: Secondary | ICD-10-CM | POA: Insufficient documentation

## 2019-03-13 HISTORY — DX: Vitamin a deficiency, unspecified: E50.9

## 2019-03-13 HISTORY — DX: Secondary hyperparathyroidism, not elsewhere classified: E21.1

## 2019-04-13 ENCOUNTER — Encounter: Payer: Self-pay | Admitting: Gastroenterology

## 2019-04-13 ENCOUNTER — Encounter: Payer: Self-pay | Admitting: Physician Assistant

## 2019-04-13 ENCOUNTER — Ambulatory Visit (INDEPENDENT_AMBULATORY_CARE_PROVIDER_SITE_OTHER): Payer: 59 | Admitting: Physician Assistant

## 2019-04-13 VITALS — HR 117

## 2019-04-13 DIAGNOSIS — R09A2 Foreign body sensation, throat: Secondary | ICD-10-CM | POA: Insufficient documentation

## 2019-04-13 DIAGNOSIS — R1310 Dysphagia, unspecified: Secondary | ICD-10-CM | POA: Insufficient documentation

## 2019-04-13 DIAGNOSIS — D509 Iron deficiency anemia, unspecified: Secondary | ICD-10-CM | POA: Diagnosis not present

## 2019-04-13 DIAGNOSIS — E509 Vitamin A deficiency, unspecified: Secondary | ICD-10-CM

## 2019-04-13 DIAGNOSIS — R000 Tachycardia, unspecified: Secondary | ICD-10-CM | POA: Insufficient documentation

## 2019-04-13 DIAGNOSIS — Z780 Asymptomatic menopausal state: Secondary | ICD-10-CM | POA: Insufficient documentation

## 2019-04-13 DIAGNOSIS — E211 Secondary hyperparathyroidism, not elsewhere classified: Secondary | ICD-10-CM

## 2019-04-13 DIAGNOSIS — R700 Elevated erythrocyte sedimentation rate: Secondary | ICD-10-CM

## 2019-04-13 DIAGNOSIS — R0600 Dyspnea, unspecified: Secondary | ICD-10-CM | POA: Insufficient documentation

## 2019-04-13 DIAGNOSIS — R0989 Other specified symptoms and signs involving the circulatory and respiratory systems: Secondary | ICD-10-CM

## 2019-04-13 DIAGNOSIS — R198 Other specified symptoms and signs involving the digestive system and abdomen: Secondary | ICD-10-CM | POA: Insufficient documentation

## 2019-04-13 DIAGNOSIS — R0609 Other forms of dyspnea: Secondary | ICD-10-CM | POA: Insufficient documentation

## 2019-04-13 DIAGNOSIS — Z1211 Encounter for screening for malignant neoplasm of colon: Secondary | ICD-10-CM

## 2019-04-13 MED ORDER — PANTOPRAZOLE SODIUM 20 MG PO TBEC: 20.0000 mg | DELAYED_RELEASE_TABLET | Freq: Every day | ORAL | 0 refills | Status: DC

## 2019-04-13 NOTE — Progress Notes (Signed)
Virtual Visit via Video Note  I connected with Meagan Floyd on 04/13/19 at 10:50 AM EDT by a video enabled telemedicine application and verified that I am speaking with the correct person using two identifiers.   I discussed the limitations of evaluation and management by telemedicine and the availability of in person appointments. The patient expressed understanding and agreed to proceed.  History of Present Illness: HPI:                                                                Meagan Floyd is a 53 y.o. female   CC: choking, palpitations   Reports palpitations with resting heart rate up to 122. Reports this has been going on for the last 2 months, gradually worsening for the last 2 weeks. She endorses DOE and fatigue with climbing a flight of stairs and getting out of the shower. Denies irregular/skipped beats, rapid HR above 150, chest pain, near syncope/syncope. Recent labs showed iron deficiency anemia (Hgb 9.2, MCV 72.3, iron 19, ferritin 3) She admits she has not been taking any iron supplementation. History significant for gastric bypass in 2016.  She has not had a menstrual period for 4-5 years and denies any AUB. Has never had colon cancer screening. Denies hematochezia/melena.  She also reports a constant globus sensation like something "is stuck in my throat." Onset 2 weeks ago, was more intermittent at that time. She states it is difficult to swallow any type of meat without excessive chewing and washing it down with a drink. She is able to swallow liquids/saliva without choking. Endorses mild nausea. Denies heartburn/indigestion/epigastric pain/forceful vomiting/change in bowel habits. Denies symptoms of allergic rhinitis including PND.  Past Medical History:  Diagnosis Date  . Depression   . Depression   . Empty sella turcica ([place]) 08/14/2017   Partial on MR Brain 08/13/2017  . Hypertension   . Microcytic anemia 08/11/2017  . Secondary hyperparathyroidism,  non-renal ([place]) 03/13/2019  . Vitamin A deficiency 03/13/2019   Past Surgical History:  Procedure Laterality Date  . ABLATION    . GASTRIC BYPASS    . SHOULDER ARTHROSCOPY    . TUMOR REMOVAL     right hand   Social History   Tobacco Use  . Smoking status: Never Smoker  . Smokeless tobacco: Never Used  Substance Use Topics  . Alcohol use: No   family history includes Bipolar disorder in her sister; Cancer in an other family member; Dementia in her father; Depression in her mother; Hypertension in her father and mother; Melanoma in her maternal grandmother.    ROS: negative except as noted in the HPI  Medications: Current Outpatient Medications  Medication Sig Dispense Refill  . ALPRAZolam (XANAX) 0.5 MG tablet Take 0.5-1 tablets (0.25-0.5 mg total) by mouth every 8 (eight) hours as needed for anxiety (panic). 20 tablet 0  . busPIRone (BUSPAR) 10 MG tablet Take 1 tablet (10 mg total) by mouth 3 (three) times daily as needed (anxiety/panic). 60 tablet 1  . Calcium-Magnesium-Vitamin D (CALCIUM 500 PO) Take by mouth.    . Cholecalciferol (VITAMIN D) 2000 UNITS CAPS Take by mouth.    . DULoxetine (CYMBALTA) 20 MG capsule TAKE 1 CAPSULE BY MOUTH EVERY DAY FOR 1 WEEK, THEN TAKE 2 CAPSULES BY MOUTH EVERY  DAY 180 capsule 1  . MULTIPLE VITAMIN PO Take by mouth.    . citalopram (CELEXA) 10 MG tablet Take 2 tabs PO QHS x 1 week, then 1 tab PO QHS x 1 week, then stop (Patient not taking: Reported on 04/13/2019) 60 tablet 0  . pantoprazole (PROTONIX) 20 MG tablet Take 1 tablet (20 mg total) by mouth daily. 30 tablet 0   No current facility-administered medications for this visit.    Allergies  Allergen Reactions  . Belsomra [Suvorexant] Other (See Comments)    paresthesias  . Codeine Nausea Only  . Penicillins Rash       Objective:  Pulse (!) 117   LMP 01/27/2013  Gen:  alert, not ill-appearing, no distress, appropriate for age 36: head normocephalic without obvious  abnormality, conjunctiva and cornea clear, trachea midline Pulm: Normal work of breathing, normal phonation Neuro: alert and oriented x 3 Psych: cooperative, euthymic mood, affect mood-congruent, speech is articulate, normal rate and volume; thought processes clear and goal-directed, normal judgment, good insight  Recent Results (from the past 2160 hour(s))  Sedimentation rate     Status: Abnormal   Collection Time: 03/08/19  8:15 AM  Result Value Ref Range   Sed Rate 48 (H) 0 - 20 mm/h  C-reactive protein     Status: Abnormal   Collection Time: 03/08/19  8:15 AM  Result Value Ref Range   CRP 22.8 (H) <8.0 mg/L  CBC (INCLUDES DIFF/PLT) WITH PATHOLOGIST REVIEW     Status: Abnormal   Collection Time: 03/08/19  8:15 AM  Result Value Ref Range   WBC 8.1 3.8 - 10.8 Thousand/uL   RBC 4.47 3.80 - 5.10 Million/uL   Hemoglobin 9.2 (L) 11.7 - 15.5 g/dL   HCT 32.3 (L) 35.0 - 45.0 %   MCV 72.3 (L) 80.0 - 100.0 fL   MCH 20.6 (L) 27.0 - 33.0 pg   MCHC 28.5 (L) 32.0 - 36.0 g/dL   RDW 17.9 (H) 11.0 - 15.0 %   Platelets 498 (H) 140 - 400 Thousand/uL   MPV 10.4 7.5 - 12.5 fL   Neutro Abs 4,730 1,500 - 7,800 cells/uL   Lymphs Abs 2,819 850 - 3,900 cells/uL   Absolute Monocytes 421 200 - 950 cells/uL   Eosinophils Absolute 81 15 - 500 cells/uL   Basophils Absolute 49 0 - 200 cells/uL   Neutrophils Relative % 58.4 %   Total Lymphocyte 34.8 %   Monocytes Relative 5.2 %   Eosinophils Relative 1.0 %   Basophils Relative 0.6 %   Comment      Comment: Myeloid population consists predominantly of mature segmented neutrophils. A few lymphocytes appear reactive. No immature cells are identified. RBCs appear to be microcytic and hypochromic on smear  review. Suggest evaluation for iron deficiency, if clinically indicated. The overall findings in this smear are consistent with a reactive thrombocytosis. Clinical correlation is recommended. Reviewed by Francis Gaines Mammarappallil, MD  (Electronic  Signature on File)     03/09/19   Fe+TIBC+Fer     Status: Abnormal   Collection Time: 03/08/19  8:15 AM  Result Value Ref Range   Iron 19 (L) 45 - 160 mcg/dL   TIBC 447 250 - 450 mcg/dL (calc)   %SAT 4 (L) 16 - 45 % (calc)   Ferritin 3 (L) 16 - 232 ng/mL  COMPLETE METABOLIC PANEL WITH GFR     Status: None   Collection Time: 03/08/19  8:15 AM  Result Value Ref Range  Glucose, Bld 90 65 - 99 mg/dL    Comment: .            Fasting reference interval .    BUN 18 7 - 25 mg/dL   Creat 0.64 0.50 - 1.05 mg/dL    Comment: For patients >10 years of age, the reference limit for Creatinine is approximately 13% higher for people identified as African-American. .    GFR, Est Non African American 104 > OR = 60 mL/min/1.20m   GFR, Est African American 121 > OR = 60 mL/min/1.773m  BUN/Creatinine Ratio NOT APPLICABLE 6 - 22 (calc)   Sodium 142 135 - 146 mmol/L   Potassium 4.7 3.5 - 5.3 mmol/L   Chloride 106 98 - 110 mmol/L   CO2 28 20 - 32 mmol/L   Calcium 8.9 8.6 - 10.4 mg/dL   Total Protein 6.4 6.1 - 8.1 g/dL   Albumin 3.8 3.6 - 5.1 g/dL   Globulin 2.6 1.9 - 3.7 g/dL (calc)   AG Ratio 1.5 1.0 - 2.5 (calc)   Total Bilirubin 0.3 0.2 - 1.2 mg/dL   Alkaline phosphatase (APISO) 94 37 - 153 U/L   AST 13 10 - 35 U/L   ALT 8 6 - 29 U/L  Vitamin A     Status: Abnormal   Collection Time: 03/08/19  8:15 AM  Result Value Ref Range   Vitamin A (Retinoic Acid) 35 (L) 38 - 98 mcg/dL    Comment: . Marland Kitchenitamin supplementation within 24 hours prior to blood draw may affect the accuracy of the results. . This test was developed and its analytical performance characteristics have been determined by [facility] [place] has not been cleared or approved by the U.S. Food and Drug Administration. This assay has been validated pursuant to the CLIA regulations and is used for clinical purposes. .   Vitamin B1     Status: None   Collection Time: 03/08/19  8:15 AM   Result Value Ref Range   Vitamin B1 (Thiamine) 13 8 - 30 nmol/L    Comment: . Marland Kitchenitamin supplementation within 24 hours prior to blood draw may affect the accuracy of the results. . This test was developed and its analytical performance characteristics have been determined by [facility] has not been cleared or approved by the U.S. Food and Drug Administration. This assay has been validated pursuant to the CLIA regulations and is used for clinical purposes. .   B12     Status: None   Collection Time: 03/08/19  8:15 AM  Result Value Ref Range   Vitamin B-12 298 200 - 1,100 pg/mL    Comment: . Please Note: Although the reference range for vitamin B12 is 272 028 9566 pg/mL, it has been reported that between 5 and 10% of patients with values between 200 and 400 pg/mL may experience neuropsychiatric and hematologic abnormalities due to occult B12 deficiency; less than 1% of patients with values above 400 pg/mL will have symptoms. .   RBC Folate     Status: None   Collection Time: 03/08/19  8:15 AM  Result Value Ref Range   RBC Folate 927 >280 ng/mL RBC  PTH, Intact and Calcium     Status: Abnormal   Collection Time: 03/08/19  8:15 AM  Result Value Ref Range   PTH 71 (H) 14 - 64 pg/mL    Comment: . Interpretive Guide    Intact PTH  Calcium ------------------    ----------           ------- Normal Parathyroid    Normal               Normal Hypoparathyroidism    Low or Low Normal    Low Hyperparathyroidism    Primary            Normal or High       High    Secondary          High                 Normal or Low    Tertiary           High                 High Non-Parathyroid    Hypercalcemia      Low or Low Normal    High .    Calcium 8.9 8.6 - 10.4 mg/dL  Prealbumin     Status: None   Collection Time: 03/08/19  8:15 AM  Result Value Ref Range   Prealbumin 21 17 - 34 mg/dL  Lipid Panel w/reflex Direct LDL     Status: Abnormal    Collection Time: 03/08/19  8:45 AM  Result Value Ref Range   Cholesterol 200 (H) <200 mg/dL   HDL 65 > OR = 50 mg/dL   Triglycerides 106 <150 mg/dL   LDL Cholesterol (Calc) 114 (H) mg/dL (calc)    Comment: Reference range: <100 . Desirable range <100 mg/dL for primary prevention;   <70 mg/dL for patients with CHD or diabetic patients  with > or = 2 CHD risk factors. Marland Kitchen LDL-C is now calculated using the Martin-Hopkins  calculation, which is a validated novel method providing  better accuracy than the Friedewald equation in the  estimation of LDL-C.  Cresenciano Genre et al. Annamaria Helling. 4132;440(10): 2061-2068  (http://education.[facility].com/faq/FAQ164)    Total CHOL/HDL Ratio 3.1 <5.0 (calc)   Non-HDL Cholesterol (Calc) 135 (H) <130 mg/dL (calc)    Comment: For patients with diabetes plus 1 major ASCVD risk  factor, treating to a non-HDL-C goal of <100 mg/dL  (LDL-C of <70 mg/dL) is considered a therapeutic  option.      No results found for this or any previous visit (from the past 72 hour(s)). No results found.    Assessment and Plan: 53 y.o. female with   .Meagan Floyd was seen today for dysphagia, irregular heart beat and nausea.  Diagnoses and all orders for this visit:  Iron deficiency anemia, unspecified iron deficiency anemia type -     Ambulatory referral to Hematology -     Ambulatory referral to Gastroenterology  Dysphagia, unspecified type -     Ambulatory referral to Gastroenterology  Colon cancer screening -     Ambulatory referral to Gastroenterology  Globus sensation -     pantoprazole (PROTONIX) 20 MG tablet; Take 1 tablet (20 mg total) by mouth daily.  Tachycardia with heart rate 100-120 beats per minute  DOE (dyspnea on exertion)  ESR raised  Vitamin A deficiency  Secondary hyperparathyroidism, non-renal (HCC)  Postmenopausal    IDA Possibly 2/2 malabsorption due to gastric bypass and noncompliance with iron supplementation However, need  to r/o GI bleed/colon cancer since patient has never had screening. Referral to GI for colonoscopy Referral placed to Hematology for iron infusion since she is symptomatic (tachycardic, DOE) and ferritin is 3  Globus sensation/Dysphagia Start Protonix 20 mg QD Referral  to GI for swallow study  Palpitations, Tachycardia, DOE Likely 2/2 IDA Prior ECG 12/2015 showed sinus tach with rate of 103 bpm Encouraged patient to follow-up in office if symptoms worsen or if symptoms persist once iron levels normalize  Follow Up Instructions:    I discussed the assessment and treatment plan with the patient. The patient was provided an opportunity to ask questions and all were answered. The patient agreed with the plan and demonstrated an understanding of the instructions.   The patient was advised to call back or seek an in-person evaluation if the symptoms worsen or if the condition fails to improve as anticipated.  I provided 15 minutes of non-face-to-face time during this encounter.   Trixie Dredge, [place]

## 2019-04-14 ENCOUNTER — Telehealth: Payer: Self-pay | Admitting: Hematology & Oncology

## 2019-04-14 NOTE — Telephone Encounter (Signed)
Mom for patient to return call to office to confirm new patient appt 8/6 @ 1030 am

## 2019-04-14 NOTE — Telephone Encounter (Signed)
Sent referral to [facility] to see if they could see her sooner. - CF

## 2019-04-15 ENCOUNTER — Telehealth: Payer: Self-pay | Admitting: Family

## 2019-04-15 NOTE — Telephone Encounter (Signed)
Called and LMVM for patient explaining that we had moved her appointment up from 8/27 to Monday July 27 per 7/17 staff message

## 2019-04-22 ENCOUNTER — Other Ambulatory Visit: Payer: Self-pay | Admitting: Family

## 2019-04-22 DIAGNOSIS — D649 Anemia, unspecified: Secondary | ICD-10-CM

## 2019-04-25 ENCOUNTER — Inpatient Hospital Stay: Payer: 59 | Attending: Family

## 2019-04-25 ENCOUNTER — Encounter: Payer: Self-pay | Admitting: Family

## 2019-04-25 ENCOUNTER — Other Ambulatory Visit: Payer: Self-pay

## 2019-04-25 ENCOUNTER — Inpatient Hospital Stay (HOSPITAL_BASED_OUTPATIENT_CLINIC_OR_DEPARTMENT_OTHER): Payer: 59 | Admitting: Family

## 2019-04-25 DIAGNOSIS — R0602 Shortness of breath: Secondary | ICD-10-CM | POA: Insufficient documentation

## 2019-04-25 DIAGNOSIS — D509 Iron deficiency anemia, unspecified: Secondary | ICD-10-CM

## 2019-04-25 DIAGNOSIS — I10 Essential (primary) hypertension: Secondary | ICD-10-CM | POA: Insufficient documentation

## 2019-04-25 DIAGNOSIS — R61 Generalized hyperhidrosis: Secondary | ICD-10-CM

## 2019-04-25 DIAGNOSIS — D508 Other iron deficiency anemias: Secondary | ICD-10-CM

## 2019-04-25 DIAGNOSIS — R232 Flushing: Secondary | ICD-10-CM

## 2019-04-25 DIAGNOSIS — K909 Intestinal malabsorption, unspecified: Secondary | ICD-10-CM | POA: Diagnosis not present

## 2019-04-25 DIAGNOSIS — D649 Anemia, unspecified: Secondary | ICD-10-CM

## 2019-04-25 DIAGNOSIS — R110 Nausea: Secondary | ICD-10-CM | POA: Diagnosis not present

## 2019-04-25 DIAGNOSIS — R7989 Other specified abnormal findings of blood chemistry: Secondary | ICD-10-CM | POA: Insufficient documentation

## 2019-04-25 DIAGNOSIS — Z79899 Other long term (current) drug therapy: Secondary | ICD-10-CM

## 2019-04-25 DIAGNOSIS — Z9884 Bariatric surgery status: Secondary | ICD-10-CM | POA: Insufficient documentation

## 2019-04-25 DIAGNOSIS — R5383 Other fatigue: Secondary | ICD-10-CM | POA: Diagnosis not present

## 2019-04-25 DIAGNOSIS — R002 Palpitations: Secondary | ICD-10-CM

## 2019-04-25 HISTORY — DX: Intestinal malabsorption, unspecified: K90.9

## 2019-04-25 HISTORY — DX: Bariatric surgery status: Z98.84

## 2019-04-25 LAB — CMP (CANCER CENTER ONLY)
ALT: 8 U/L (ref 0–44)
AST: 12 U/L — ABNORMAL LOW (ref 15–41)
Albumin: 3.9 g/dL (ref 3.5–5.0)
Alkaline Phosphatase: 93 U/L (ref 38–126)
Anion gap: 9 (ref 5–15)
BUN: 16 mg/dL (ref 6–20)
CO2: 26 mmol/L (ref 22–32)
Calcium: 8.3 mg/dL — ABNORMAL LOW (ref 8.9–10.3)
Chloride: 104 mmol/L (ref 98–111)
Creatinine: 0.62 mg/dL (ref 0.44–1.00)
GFR, Est AFR Am: >60 mL/min (ref >60)
GFR, Estimated: >60 mL/min (ref >60)
Glucose, Bld: 93 mg/dL (ref 70–99)
Potassium: 3.6 mmol/L (ref 3.5–5.1)
Sodium: 139 mmol/L (ref 135–145)
Total Bilirubin: 0.3 mg/dL (ref 0.3–1.2)
Total Protein: 6.8 g/dL (ref 6.5–8.1)

## 2019-04-25 LAB — CBC WITH DIFFERENTIAL (CANCER CENTER ONLY)
Abs Immature Granulocytes: 0.03 10*3/uL (ref 0.00–0.07)
Basophils Absolute: 0.1 10*3/uL (ref 0.0–0.1)
Basophils Relative: 1 %
Eosinophils Absolute: 0.1 10*3/uL (ref 0.0–0.5)
Eosinophils Relative: 1 %
HCT: 30.5 % — ABNORMAL LOW (ref 36.0–46.0)
Hemoglobin: 8.8 g/dL — ABNORMAL LOW (ref 12.0–15.0)
Immature Granulocytes: 0 %
Lymphocytes Relative: 25 %
Lymphs Abs: 2.1 10*3/uL (ref 0.7–4.0)
MCH: 20.1 pg — ABNORMAL LOW (ref 26.0–34.0)
MCHC: 28.9 g/dL — ABNORMAL LOW (ref 30.0–36.0)
MCV: 69.8 fL — ABNORMAL LOW (ref 80.0–100.0)
Monocytes Absolute: 0.5 10*3/uL (ref 0.1–1.0)
Monocytes Relative: 6 %
Neutro Abs: 5.6 10*3/uL (ref 1.7–7.7)
Neutrophils Relative %: 67 %
Platelet Count: 467 10*3/uL — ABNORMAL HIGH (ref 150–400)
RBC: 4.37 MIL/uL (ref 3.87–5.11)
RDW: 18.7 % — ABNORMAL HIGH (ref 11.5–15.5)
WBC Count: 8.4 10*3/uL (ref 4.0–10.5)
nRBC: 0 % (ref 0.0–0.2)

## 2019-04-25 LAB — RETICULOCYTES
Immature Retic Fract: 16.9 % — ABNORMAL HIGH (ref 2.3–15.9)
RBC.: 4.31 MIL/uL (ref 3.87–5.11)
Retic Count, Absolute: 48.3 10*3/uL (ref 19.0–186.0)
Retic Ct Pct: 1.1 % (ref 0.4–3.1)

## 2019-04-25 LAB — SAVE SMEAR(SSMR), FOR PROVIDER SLIDE REVIEW

## 2019-04-25 NOTE — Progress Notes (Signed)
Hematology/Oncology Consultation   Name: Meagan Floyd      MRN: [id number]    Location: Room/bed info not found  Date: 04/25/2019 Time:10:45 AM   REFERRING PHYSICIAN: Levonne Hubertharley E. 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  In June, iron saturation was 4% and ferritin 3. Hgb today is 8.8 and MCV 69.  She denies noting any blood loss. No bruising or petechiae.  She is symptomatic with fatigue, SOB with exertion, palpitations, brain fog and craving cold drinks.  No family history of anemia that she is aware of.  She was found to be on the low end of normal with B 12 and her PCP had her start a supplement.  She states that she is postmenopausal and has not had a cycle for several years. She has occasional hot flashes and night sweats.   She states that when she did have a cycle it was quite heavy. She has 2 children and no history of miscarriage.  She states that she has felt like she has a lump in her throat and will be having an endoscopy. She has started taking Protonix. She denies having problems with swallowing or choking. She has had some mild nausea but no vomiting.  No personal history of cancer. Family history includes paternal grandmother with leukemia and maternal grandmother with melanoma.   No fever, chills, cough, rash, dizziness, SOB, chest pain, palpitations, abdominal pain or changes in bowel or bladder habits.  She states that she has occasional puffiness in her ankles at the end of the day.  She walks some for exercise but lately the heat and fatigue have prevented her from getting out.  She states that she has a good appetite and is staying well hydrated. Her weight is described as stable.  She does not smoke or drink alcoholic beverages.  She works in HR  in benefits.    ROS: All other 10 point review of systems is negative.   PAST MEDICAL HISTORY:   Past Medical History:  Diagnosis Date  . Depression   . Depression   . 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 Hypertension   . Microcytic anemia 08/11/2017  . Secondary hyperparathyroidism, non-renal (HCC) 03/13/2019  . Vitamin A deficiency 03/13/2019    ALLERGIES: Allergies  Allergen Reactions  . Belsomra [Suvorexant] Other (See Comments)    paresthesias  . Codeine Nausea Only  . Penicillins Rash      MEDICATIONS:  Current Outpatient Medications on File Prior to Visit  Medication Sig Dispense Refill  . ALPRAZolam (XANAX) 0.5 MG tablet Take 0.5-1 tablets (0.25-0.5 mg total) by mouth every 8 (eight) hours as needed for anxiety (panic). 20 tablet 0  . busPIRone (BUSPAR) 10 MG tablet Take 1 tablet (10 mg total) by mouth 3 (three) times daily as needed (anxiety/panic). 60 tablet 1  . Calcium-Magnesium-Vitamin D (CALCIUM 500 PO) Take by mouth.    . Cholecalciferol (VITAMIN D) 2000 UNITS CAPS Take by mouth.    . citalopram (CELEXA) 10 MG tablet Take 2 tabs PO QHS x 1 week, then 1 tab PO QHS x 1 week, then stop (Patient not taking: Reported on 04/13/2019) 60 tablet 0  . DULoxetine (CYMBALTA) 20 MG capsule TAKE 1 CAPSULE BY MOUTH EVERY  DAY FOR 1 WEEK, THEN TAKE 2 CAPSULES BY MOUTH EVERY DAY 180 capsule 1  . MULTIPLE VITAMIN PO Take by mouth.    . pantoprazole (PROTONIX) 20 MG tablet Take 1 tablet (20 mg total) by mouth daily. 30 tablet 0   No current facility-administered medications on file prior to visit.      PAST SURGICAL HISTORY Past Surgical History:  Procedure Laterality Date  . ABLATION    . GASTRIC BYPASS    . SHOULDER ARTHROSCOPY    . TUMOR REMOVAL     right hand    FAMILY HISTORY: Family History  Problem Relation Age of Onset  . Hypertension Mother   . Depression Mother   . Cancer Other        skin  . Hypertension Father   .  Dementia Father   . Bipolar disorder Sister   . Melanoma Maternal Grandmother     SOCIAL HISTORY:  reports that she has never smoked. She has never used smokeless tobacco. She reports that she does not drink alcohol or use drugs.  PERFORMANCE STATUS: The patient's performance status is 1 - Symptomatic but completely ambulatory  PHYSICAL EXAM: Most Recent Vital Signs: Last menstrual period 01/27/2013. BP (!) 145/76 (BP Location: Right Arm, Patient Position: Sitting)   Pulse 82   Temp 97.7 F (36.5 C) (Temporal)   Resp 18   Ht 5\' 4"  (1.626 m)   Wt 214 lb 1.9 oz (97.1 kg)   LMP 01/27/2013   SpO2 100%   BMI 36.75 kg/m   General Appearance:    Alert, cooperative, no distress, appears stated age  Head:    Normocephalic, without obvious abnormality, atraumatic  Eyes:    PERRL, conjunctiva/corneas clear, EOM's intact, fundi    benign, both eyes        Throat:   Lips, mucosa, and tongue normal; teeth and gums normal  Neck:   Supple, symmetrical, trachea midline, no adenopathy;    thyroid:  no enlargement/tenderness/nodules; no carotid   bruit or JVD  Back:     Symmetric, no curvature, ROM normal, no CVA tenderness  Lungs:     Clear to auscultation bilaterally, respirations unlabored  Chest Wall:    No tenderness or deformity   Heart:    Regular rate and rhythm, S1 and S2 normal, no murmur, rub   or gallop     Abdomen:     Soft, non-tender, bowel sounds active all four quadrants,    no masses, no organomegaly        Extremities:   Extremities normal, atraumatic, no cyanosis or edema  Pulses:   2+ and symmetric all extremities  Skin:   Skin color, texture, turgor normal, no rashes or lesions  Lymph nodes:   Cervical, supraclavicular, and axillary nodes normal  Neurologic:   CNII-XII intact, normal strength, sensation and reflexes    throughout    LABORATORY DATA:  Results for orders placed or performed in visit on 04/25/19 (from the past 48 hour(s))  Save Smear (SSMR)      Status: None   Collection Time: 04/25/19 10:16 AM  Result Value Ref Range   Smear Review SMEAR STAINED AND AVAILABLE FOR REVIEW     Comment: Performed at [facility] [place], [address], [place], [place] [address]  CBC with Differential (Cancer Center Only)     Status: Abnormal   Collection Time: 04/25/19 10:16 AM  Result Value Ref Range   WBC  Count 8.4 4.0 - 10.5 K/uL   RBC 4.37 3.87 - 5.11 MIL/uL   Hemoglobin 8.8 (L) 12.0 - 15.0 g/dL    Comment: Reticulocyte Hemoglobin testing may be clinically indicated, consider ordering this additional test [id number]    HCT 30.5 (L) 36.0 - 46.0 %   MCV 69.8 (L) 80.0 - 100.0 fL   MCH 20.1 (L) 26.0 - 34.0 pg   MCHC 28.9 (L) 30.0 - 36.0 g/dL   RDW 54.018.7 (H) 98.111.5 - 19.115.5 %   Platelet Count 467 (H) 150 - 400 K/uL   nRBC 0.0 0.0 - 0.2 %   Neutrophils Relative % 67 %   Neutro Abs 5.6 1.7 - 7.7 K/uL   Lymphocytes Relative 25 %   Lymphs Abs 2.1 0.7 - 4.0 K/uL   Monocytes Relative 6 %   Monocytes Absolute 0.5 0.1 - 1.0 K/uL   Eosinophils Relative 1 %   Eosinophils Absolute 0.1 0.0 - 0.5 K/uL   Basophils Relative 1 %   Basophils Absolute 0.1 0.0 - 0.1 K/uL   Immature Granulocytes 0 %   Abs Immature Granulocytes 0.03 0.00 - 0.07 K/uL    Comment: Performed at [facility] [place], [address], [place], [place] [address]  Reticulocytes     Status: Abnormal   Collection Time: 04/25/19 10:17 AM  Result Value Ref Range   Retic Ct Pct 1.1 0.4 - 3.1 %   RBC. 4.31 3.87 - 5.11 MIL/uL   Retic Count, Absolute 48.3 19.0 - 186.0 K/uL   Immature Retic Fract 16.9 (H) 2.3 - 15.9 %    Comment: Performed at [facility] [place], [address], [place], [place] [address]      RADIOGRAPHY: No results found.     PATHOLOGY: None  ASSESSMENT/PLAN: Meagan Floyd is a very pleasant 53 yo caucasian female with iron deficiency anemia secondary to malabsorption  after gastric sleeve surgery.  She is symptomatic as mentioned above with low iron counts.  We will get her set up for 2 doses of  IV iron and then follow-up in 6 weeks.   All questions were answered and she is in agreement with the plan. She will contact our office with any questions or concerns. We can certainly see her sooner if needed.   She was discussed with and also seen by Dr. Myna HidalgoEnnever and he is in agreement with the aforementioned.   Meagan GinsSarah , NP   Addendum: I saw examined the patient with Meagan SagoSarah.  I agree with the above assessment.  Meagan Floyd clearly has marked iron deficiency.  Her iron studies that were done about a month or so ago showed a ferritin or iron saturation of 4%.  She is had a gastric bypass.  She just is not absorbing any iron.  I do not think oral iron would really work for her.  Her blood smear does show some slight hypochromia and microcytic red blood cells.  Her white blood cells appear mature.  I see no hyper segmented polys.  Platelets are adequate number and size.  Her MCV is quite low so again, I suspect that this is clearly iron deficiency.  She has anemia.  Her thrombocytosis also indicates iron deficiency.  She will need at least 2 doses of IV iron.  We will have to get this all set up for her.  We spent about 45 minutes with her.  She is very nice.  She is a lot of fun to talk to.  We will plan to get her back in about a month or so.  By then, we should see a nice response to her blood counts with the IV iron.  Meagan Haw, MD

## 2019-04-26 LAB — IRON AND TIBC
Iron: 20 ug/dL — ABNORMAL LOW (ref 41–142)
Saturation Ratios: 4 % — ABNORMAL LOW (ref 21–57)
TIBC: 463 ug/dL — ABNORMAL HIGH (ref 236–444)
UIBC: 443 ug/dL — ABNORMAL HIGH (ref 120–384)

## 2019-04-26 LAB — ERYTHROPOIETIN: Erythropoietin: 123.3 m[IU]/mL — ABNORMAL HIGH (ref 2.6–18.5)

## 2019-04-26 LAB — FERRITIN: Ferritin: <4 ng/mL — ABNORMAL LOW (ref 11–307)

## 2019-04-29 ENCOUNTER — Other Ambulatory Visit: Payer: Self-pay

## 2019-04-29 ENCOUNTER — Inpatient Hospital Stay: Payer: 59

## 2019-04-29 VITALS — BP 145/88 | HR 90 | Temp 97.8°F | Resp 17

## 2019-04-29 DIAGNOSIS — D509 Iron deficiency anemia, unspecified: Secondary | ICD-10-CM | POA: Diagnosis not present

## 2019-04-29 DIAGNOSIS — K912 Postsurgical malabsorption, not elsewhere classified: Secondary | ICD-10-CM

## 2019-04-29 DIAGNOSIS — D508 Other iron deficiency anemias: Secondary | ICD-10-CM

## 2019-04-29 MED ORDER — SODIUM CHLORIDE 0.9 % IV SOLN
510.0000 mg | Freq: Once | INTRAVENOUS | Status: AC
  Administered 2019-04-29: 510 mg via INTRAVENOUS
  Filled 2019-04-29: qty 17

## 2019-04-29 MED ORDER — SODIUM CHLORIDE 0.9 % IV SOLN
Freq: Once | INTRAVENOUS | Status: AC
  Administered 2019-04-29: 10:00:00 via INTRAVENOUS
  Filled 2019-04-29: qty 250

## 2019-04-29 NOTE — Patient Instructions (Signed)

## 2019-04-30 ENCOUNTER — Other Ambulatory Visit: Payer: Self-pay | Admitting: Physician Assistant

## 2019-04-30 DIAGNOSIS — R0989 Other specified symptoms and signs involving the circulatory and respiratory systems: Secondary | ICD-10-CM

## 2019-04-30 DIAGNOSIS — R198 Other specified symptoms and signs involving the digestive system and abdomen: Secondary | ICD-10-CM

## 2019-05-05 ENCOUNTER — Ambulatory Visit: Payer: 59 | Admitting: Hematology & Oncology

## 2019-05-05 ENCOUNTER — Ambulatory Visit: Payer: 59 | Admitting: Gastroenterology

## 2019-05-05 ENCOUNTER — Other Ambulatory Visit: Payer: 59

## 2019-05-06 ENCOUNTER — Other Ambulatory Visit: Payer: 59

## 2019-05-06 ENCOUNTER — Ambulatory Visit: Payer: 59 | Admitting: Family

## 2019-05-06 ENCOUNTER — Ambulatory Visit: Payer: 59

## 2019-05-09 ENCOUNTER — Inpatient Hospital Stay: Payer: 59 | Attending: Family

## 2019-05-09 ENCOUNTER — Other Ambulatory Visit: Payer: Self-pay

## 2019-05-09 VITALS — BP 136/79 | HR 70 | Temp 98.6°F | Resp 18

## 2019-05-09 DIAGNOSIS — Z79899 Other long term (current) drug therapy: Secondary | ICD-10-CM | POA: Diagnosis not present

## 2019-05-09 DIAGNOSIS — K909 Intestinal malabsorption, unspecified: Secondary | ICD-10-CM | POA: Insufficient documentation

## 2019-05-09 DIAGNOSIS — R5383 Other fatigue: Secondary | ICD-10-CM | POA: Diagnosis not present

## 2019-05-09 DIAGNOSIS — Z9884 Bariatric surgery status: Secondary | ICD-10-CM | POA: Diagnosis not present

## 2019-05-09 DIAGNOSIS — Z8249 Family history of ischemic heart disease and other diseases of the circulatory system: Secondary | ICD-10-CM | POA: Diagnosis not present

## 2019-05-09 DIAGNOSIS — Z818 Family history of other mental and behavioral disorders: Secondary | ICD-10-CM | POA: Diagnosis not present

## 2019-05-09 DIAGNOSIS — Z808 Family history of malignant neoplasm of other organs or systems: Secondary | ICD-10-CM | POA: Diagnosis not present

## 2019-05-09 DIAGNOSIS — D508 Other iron deficiency anemias: Secondary | ICD-10-CM | POA: Diagnosis not present

## 2019-05-09 DIAGNOSIS — R0602 Shortness of breath: Secondary | ICD-10-CM | POA: Insufficient documentation

## 2019-05-09 DIAGNOSIS — R002 Palpitations: Secondary | ICD-10-CM | POA: Diagnosis not present

## 2019-05-09 DIAGNOSIS — K912 Postsurgical malabsorption, not elsewhere classified: Secondary | ICD-10-CM

## 2019-05-09 MED ORDER — SODIUM CHLORIDE 0.9 % IV SOLN
510.0000 mg | Freq: Once | INTRAVENOUS | Status: AC
  Administered 2019-05-09: 510 mg via INTRAVENOUS
  Filled 2019-05-09: qty 510

## 2019-05-09 MED ORDER — SODIUM CHLORIDE 0.9 % IV SOLN
Freq: Once | INTRAVENOUS | Status: AC
  Administered 2019-05-09: 14:00:00 via INTRAVENOUS
  Filled 2019-05-09: qty 250

## 2019-05-13 ENCOUNTER — Telehealth: Payer: Self-pay

## 2019-05-13 NOTE — Telephone Encounter (Signed)
LVM for patient to call me back. I need to ask her some questions

## 2019-05-16 ENCOUNTER — Encounter: Payer: Self-pay | Admitting: Gastroenterology

## 2019-05-16 ENCOUNTER — Ambulatory Visit (INDEPENDENT_AMBULATORY_CARE_PROVIDER_SITE_OTHER): Payer: 59 | Admitting: Gastroenterology

## 2019-05-16 ENCOUNTER — Other Ambulatory Visit: Payer: Self-pay

## 2019-05-16 VITALS — BP 130/90 | HR 85 | Temp 98.6°F | Ht 64.0 in | Wt 215.5 lb

## 2019-05-16 DIAGNOSIS — R0989 Other specified symptoms and signs involving the circulatory and respiratory systems: Secondary | ICD-10-CM

## 2019-05-16 DIAGNOSIS — Z1211 Encounter for screening for malignant neoplasm of colon: Secondary | ICD-10-CM | POA: Diagnosis not present

## 2019-05-16 DIAGNOSIS — R198 Other specified symptoms and signs involving the digestive system and abdomen: Secondary | ICD-10-CM

## 2019-05-16 DIAGNOSIS — D509 Iron deficiency anemia, unspecified: Secondary | ICD-10-CM

## 2019-05-16 MED ORDER — SUPREP BOWEL PREP KIT 17.5-3.13-1.6 GM/177ML PO SOLN: 1.0000 | ORAL | 0 refills | Status: DC

## 2019-05-16 NOTE — Patient Instructions (Signed)
If you are age 51 or older, your body mass index should be between 23-30. Your Body mass index is 36.99 kg/m. If this is out of the aforementioned range listed, please consider follow up with your Primary Care Provider.  If you are age 24 or younger, your body mass index should be between 19-25. Your Body mass index is 36.99 kg/m. If this is out of the aformentioned range listed, please consider follow up with your Primary Care Provider.   You have been scheduled for an endoscopy and colonoscopy. Please follow the written instructions given to you at your visit today. Please pick up your prep supplies at the pharmacy within the next 1-3 days. If you use inhalers (even only as needed), please bring them with you on the day of your procedure. Your physician has requested that you go to www.startemmi.com and enter the access code given to you at your visit today. This web site gives a general overview about your procedure. However, you should still follow specific instructions given to you by our office regarding your preparation for the procedure.  We have sent the following medications to your pharmacy for you to pick up at your convenience: Suprep  It was a pleasure to see you today!  Vito Cirigliano, D.O.

## 2019-05-16 NOTE — Telephone Encounter (Signed)
Covid-19 screening questions   Do you now or have you had a fever in the last 14 days? No  Do you have any respiratory symptoms of shortness of breath or cough now or in the last 14 days? No  Do you have any family members or close contacts with diagnosed or suspected Covid-19 in the past 14 days? No  Have you been tested for Covid-19 and found to be positive? No        

## 2019-05-16 NOTE — Progress Notes (Signed)
Chief Complaint: Dysphagia, IDA  Referring Provider:     Trixie Dredge   HPI:    Meagan Floyd is a 53 y.o. female referred to the Gastroenterology Clinic for evaluation of dysphagia.  Symptoms started apprx 2 months ago, described as a "lump in the throat", pointing to left anterior neck/suprasternal notch.  No prior similar symptoms.  Does have nausea, without emesis, but otherwise denies heartburn, regurgitation, sour brash. Trialed course of Protonix without much improvement.   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  Hemoglobin 10.8 with MCV 76 in 07/2017.  In June, H/H 9.2/32.3 with MCV/RDW 72/18.  Iron 19, iron saturation 4%, ferritin 3.  Was referred to the Hematology clinic, seen 04/25/2019.  Repeat hemoglobin 8.8, MCV 69 on 04/25/2019.  Otherwise, no overt GI blood loss.  She does endorse fatigue, DOE, palpitations, craving cold drinks.  Treated with IV iron x2 in 03/2019, with plan for repeat CBC and iron panel in September.  Low vitamin A (35), B12 low normal (298), started on oral B12 supplement.  Normal B1.  CMP normal.  Elevated ESR/CRP.  Family history notable for PGM with leukemia, MGM with melanoma.  Otherwise no GI malignancy, IBD, liver disease.  No previous EGD or colonoscopy.   Past Medical History:  Diagnosis Date  . Anemia   . Chronic headache   . Depression   . Depression   . Empty sella turcica ([place]) 08/14/2017   Partial on MR Brain 08/13/2017  . History of gastric bypass 04/25/2019  . Hypertension   . 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 Microcytic anemia 08/11/2017  . Obesity   . Secondary hyperparathyroidism, non-renal ([place]) 03/13/2019  . Vitamin A deficiency 03/13/2019     Past Surgical History:  Procedure Laterality Date  . ABLATION    . GASTRIC BYPASS    . SHOULDER ARTHROSCOPY    . TUMOR REMOVAL     right hand   Family History   Problem Relation Age of Onset  . Hypertension Mother   . Depression Mother   . Cancer Other        skin  . Hypertension Father   . Dementia Father   . Bipolar disorder Sister   . Melanoma Maternal Grandmother   . Colon cancer Neg Hx   . Esophageal cancer Neg Hx    Social History   Tobacco Use  . Smoking status: Never Smoker  . Smokeless tobacco: Never Used  Substance Use Topics  . Alcohol use: No  . Drug use: No   Current Outpatient Medications  Medication Sig Dispense Refill  . busPIRone (BUSPAR) 10 MG tablet Take 10 mg by mouth 3 (three) times daily as needed.    . DULoxetine (CYMBALTA) 20 MG capsule TAKE 1 CAPSULE BY MOUTH EVERY DAY FOR 1 WEEK, THEN TAKE 2 CAPSULES BY MOUTH EVERY DAY (Patient taking differently: Take 40 mg by mouth daily. ) 180 capsule 1  . pantoprazole (PROTONIX) 20 MG tablet TAKE 1 TABLET BY MOUTH EVERY DAY 90 tablet 0   No current facility-administered medications for this visit.    Allergies  Allergen Reactions  . Belsomra [Suvorexant] Other (See Comments)    paresthesias  . Codeine Nausea Only  . Penicillins Rash     Review of Systems: All systems reviewed and negative except where noted in HPI.     Physical Exam:  Wt Readings from Last 3 Encounters:  05/16/19 215 lb 8 oz (97.8 kg)  04/25/19 214 lb 1.9 oz (97.1 kg)  01/26/19 210 lb (95.3 kg)    BP 130/90   Pulse 85   Temp 98.6 F (37 C)   Ht 5' 4"  (1.626 m)   Wt 215 lb 8 oz (97.8 kg)   LMP 01/27/2013   BMI 36.99 kg/m  Constitutional:  Pleasant, in no acute distress. Psychiatric: Normal mood and affect. Behavior is normal. EENT: Pupils normal.  Conjunctivae are normal. No scleral icterus. Neck supple. No cervical LAD. Cardiovascular: Normal rate, regular rhythm. No edema Pulmonary/chest: Effort normal and breath sounds normal. No wheezing, rales or rhonchi. Abdominal: Soft, nondistended, nontender. Bowel sounds active throughout. There are no masses palpable. No  hepatomegaly. Neurological: Alert and oriented to person place and time. Skin: Skin is warm and dry. No rashes noted.   ASSESSMENT AND PLAN;   Meagan Floyd is a 53 y.o. female presenting with:  1) Dysphagia/Globus sensation: - EGD with biopsy and/or esophageal dilation as appropriate - Advised patient to cut food into small pieces, eat small bites, chew food thoroughly and with plenty of liquids to avoid food impaction. -If EGD unrevealing, may need to consider cross-sectional imaging  2) Iron Deficiency Anemia: -Clinically seems most likely 2/2 history of gastric sleeve surgery.  Plan to evaluate for additional UGI pathology at time of EGD as above, with plan for small bowel biopsies to rule out Celiac Disease - Repeat CBC and iron panel as already planned by Hematology clinic -Already received iron infusions x2 by Hematology -Can evaluate for additional mucosal/luminal etiology at time of colonoscopy  3) CRC Screening: -No previous CRC screening.  Due for routine, age-appropriate, average risk CRC screening -Schedule for colonoscopy  The indications, risks, and benefits of EGD and colonoscopy were explained to the patient in detail. Risks include but are not limited to bleeding, perforation, adverse reaction to medications, and cardiopulmonary compromise. Sequelae include but are not limited to the possibility of surgery, hositalization, and mortality. The patient verbalized understanding and wished to proceed. All questions answered, referred to scheduler and bowel prep ordered. Further recommendations pending results of the exam.     Lavena Bullion, DO, FACG  05/16/2019, 1:41 PM   Maisie Fus, Gretta Arab*

## 2019-05-24 ENCOUNTER — Encounter: Payer: Self-pay | Admitting: Gastroenterology

## 2019-05-24 ENCOUNTER — Telehealth: Payer: Self-pay | Admitting: Gastroenterology

## 2019-05-24 NOTE — Telephone Encounter (Signed)

## 2019-05-25 ENCOUNTER — Encounter: Payer: Self-pay | Admitting: Gastroenterology

## 2019-05-25 ENCOUNTER — Encounter: Payer: 59 | Admitting: Gastroenterology

## 2019-05-25 ENCOUNTER — Ambulatory Visit (AMBULATORY_SURGERY_CENTER): Payer: 59 | Admitting: Gastroenterology

## 2019-05-25 ENCOUNTER — Other Ambulatory Visit: Payer: Self-pay

## 2019-05-25 VITALS — BP 150/87 | HR 68 | Temp 97.8°F | Resp 19 | Ht 64.0 in | Wt 215.0 lb

## 2019-05-25 DIAGNOSIS — R09A2 Foreign body sensation, throat: Secondary | ICD-10-CM

## 2019-05-25 DIAGNOSIS — D509 Iron deficiency anemia, unspecified: Secondary | ICD-10-CM

## 2019-05-25 DIAGNOSIS — Z1211 Encounter for screening for malignant neoplasm of colon: Secondary | ICD-10-CM

## 2019-05-25 DIAGNOSIS — K2970 Gastritis, unspecified, without bleeding: Secondary | ICD-10-CM

## 2019-05-25 DIAGNOSIS — R1319 Other dysphagia: Secondary | ICD-10-CM

## 2019-05-25 DIAGNOSIS — K2990 Gastroduodenitis, unspecified, without bleeding: Secondary | ICD-10-CM

## 2019-05-25 DIAGNOSIS — R0989 Other specified symptoms and signs involving the circulatory and respiratory systems: Secondary | ICD-10-CM

## 2019-05-25 DIAGNOSIS — R1310 Dysphagia, unspecified: Secondary | ICD-10-CM | POA: Diagnosis not present

## 2019-05-25 DIAGNOSIS — R198 Other specified symptoms and signs involving the digestive system and abdomen: Secondary | ICD-10-CM

## 2019-05-25 MED ORDER — SODIUM CHLORIDE 0.9 % IV SOLN: 500.0000 mL | Freq: Once | INTRAVENOUS | Status: DC

## 2019-05-25 NOTE — Op Note (Signed)
[facility] Patient Name: Meagan Floyd Procedure Date: 05/25/2019 9:25 AM MRN: [id number] Endoscopist: Doristine Locks , MD Age: 53 Referring MD:  Date of Birth: Nov 09, 1965 Gender: Female Account #: [id number] Procedure:                Colonoscopy Indications:              Screening for colorectal malignant neoplasm Medicines:                Monitored Anesthesia Care Procedure:                Pre-Anesthesia Assessment:                           - Prior to the procedure, a History and Physical                            was performed, and patient medications and                            allergies were reviewed. The patient's tolerance of                            previous anesthesia was also reviewed. The risks                            and benefits of the procedure and the sedation                            options and risks were discussed with the patient.                            All questions were answered, and informed consent                            was obtained. Prior Anticoagulants: The patient has                            taken no previous anticoagulant or antiplatelet                            agents. ASA Grade Assessment: II - A patient with                            mild systemic disease. After reviewing the risks                            and benefits, the patient was deemed in                            satisfactory condition to undergo the procedure.                           After obtaining informed consent, the colonoscope  was passed under direct vision. Throughout the                            procedure, the patient's blood pressure, pulse, and                            oxygen saturations were monitored continuously. The                            Colonoscope was introduced through the anus and                            advanced to the the terminal ileum. The colonoscopy                            was performed  without difficulty. The patient                            tolerated the procedure well. The quality of the                            bowel preparation was adequate. The terminal ileum,                            ileocecal valve, appendiceal orifice, and rectum                            were photographed. Scope In: 9:53:18 AM Scope Out: 10:05:51 AM Scope Withdrawal Time: 0 hours 9 minutes 48 seconds  Total Procedure Duration: 0 hours 12 minutes 33 seconds  Findings:                 The perianal and digital rectal examinations were                            normal.                           The colon (entire examined portion) appeared normal.                           Retroflexion in the right colon was performed.                           The retroflexed view of the distal rectum and anal                            verge was normal and showed no anal or rectal                            abnormalities.                           The terminal ileum appeared normal. Complications:            No immediate complications. Estimated  Blood Loss:     Estimated blood loss was minimal. Impression:               - The entire examined colon is normal.                           - The distal rectum and anal verge are normal on                            retroflexion view.                           - The examined portion of the ileum was normal.                           - No specimens collected. Recommendation:           - Patient has a contact number available for                            emergencies. The signs and symptoms of potential                            delayed complications were discussed with the                            patient. Return to normal activities tomorrow.                            Written discharge instructions were provided to the                            patient.                           - Resume previous diet.                           - Continue present  medications.                           - Repeat colonoscopy in 10 years for screening                            purposes.                           - Return to GI office at appointment to be                            scheduled. Gerrit Heck, MD 05/25/2019 10:23:15 AM

## 2019-05-25 NOTE — Progress Notes (Signed)
Vitals: Courtney W Temp: June B 

## 2019-05-25 NOTE — Patient Instructions (Signed)
Thank you for allowing [place] to care for you today!  Await pathology of upper endoscopy, approximately 2 weeks.    Return to GI clinic for follow-up, appointment to be set up.  Resume previous diet and medications today..  Return to your normal activities tomorrow.  Recommend next screening colonoscopy in 10 years.      YOU HAD AN ENDOSCOPIC PROCEDURE TODAY AT [facility]:   Refer to the procedure report that was given to you for any specific questions about what was found during the examination.  If the procedure report does not answer your questions, please call your gastroenterologist to clarify.  If you requested that your care partner not be given the details of your procedure findings, then the procedure report has been included in a sealed envelope for you to review at your convenience later.  YOU SHOULD EXPECT: Some feelings of bloating in the abdomen. Passage of more gas than usual.  Walking can help get rid of the air that was put into your GI tract during the procedure and reduce the bloating. If you had a lower endoscopy (such as a colonoscopy or flexible sigmoidoscopy) you may notice spotting of blood in your stool or on the toilet paper. If you underwent a bowel prep for your procedure, you may not have a normal bowel movement for a few days.  Please Note:  You might notice some irritation and congestion in your nose or some drainage.  This is from the oxygen used during your procedure.  There is no need for concern and it should clear up in a day or so.  SYMPTOMS TO REPORT IMMEDIATELY:   Following lower endoscopy (colonoscopy or flexible sigmoidoscopy):  Excessive amounts of blood in the stool  Significant tenderness or worsening of abdominal pains  Swelling of the abdomen that is new, acute  Fever of 100F or higher   Following upper endoscopy (EGD)  Vomiting of blood or coffee ground material  New chest pain or pain under the shoulder blades  Painful  or persistently difficult swallowing  New shortness of breath  Fever of 100F or higher  Black, tarry-looking stools  For urgent or emergent issues, a gastroenterologist can be reached at any hour by calling [phone].   DIET:  We do recommend a small meal at first, but then you may proceed to your regular diet.  Drink plenty of fluids but you should avoid alcoholic beverages for 24 hours.  ACTIVITY:  You should plan to take it easy for the rest of today and you should NOT DRIVE or use heavy machinery until tomorrow (because of the sedation medicines used during the test).    FOLLOW UP: Our staff will call the number listed on your records 48-72 hours following your procedure to check on you and address any questions or concerns that you may have regarding the information given to you following your procedure. If we do not reach you, we will leave a message.  We will attempt to reach you two times.  During this call, we will ask if you have developed any symptoms of COVID 19. If you develop any symptoms (ie: fever, flu-like symptoms, shortness of breath, cough etc.) before then, please call [phone].  If you test positive for Covid 19 in the 2 weeks post procedure, please call and report this information to [place].    If any biopsies were taken you will be contacted by phone or by letter within the next 1-3 weeks.  Please call us at [phone] if you have not heard about the biopsies in 3 weeks.    SIGNATURES/CONFIDENTIALITY: You and/or your care partner have signed paperwork which will be entered into your electronic medical record.  These signatures attest to the fact that that the information above on your After Visit Summary has been reviewed and is understood.  Full responsibility of the confidentiality of this discharge information lies with you and/or your care-partner.

## 2019-05-25 NOTE — Progress Notes (Signed)
Called to room to assist during endoscopic procedure.  Patient ID and intended procedure confirmed with present staff. Received instructions for my participation in the procedure from the performing physician.  

## 2019-05-25 NOTE — Progress Notes (Signed)
Pt's states no medical or surgical changes since previsit or office visit. 

## 2019-05-25 NOTE — Progress Notes (Signed)
To PACU, VSS. Report to [email or web address] 

## 2019-05-25 NOTE — Op Note (Signed)
[facility] Patient Name: Meagan Floyd Procedure Date: 05/25/2019 9:27 AM MRN: [id number] Endoscopist: Doristine Locks , MD Age: 53 Referring MD:  Date of Birth: 11-01-1965 Gender: Female Account #: [id number] Procedure:                Upper GI endoscopy Indications:              Iron deficiency anemia, Dysphagia Medicines:                Monitored Anesthesia Care Procedure:                Pre-Anesthesia Assessment:                           - Prior to the procedure, a History and Physical                            was performed, and patient medications and                            allergies were reviewed. The patient's tolerance of                            previous anesthesia was also reviewed. The risks                            and benefits of the procedure and the sedation                            options and risks were discussed with the patient.                            All questions were answered, and informed consent                            was obtained. Prior Anticoagulants: The patient has                            taken no previous anticoagulant or antiplatelet                            agents. ASA Grade Assessment: II - A patient with                            mild systemic disease. After reviewing the risks                            and benefits, the patient was deemed in                            satisfactory condition to undergo the procedure.                           After obtaining informed consent, the endoscope was  passed under direct vision. Throughout the                            procedure, the patient's blood pressure, pulse, and                            oxygen saturations were monitored continuously. The                            Endoscope was introduced through the mouth, and                            advanced to the jejunum. The upper GI endoscopy was                            accomplished  without difficulty. The patient                            tolerated the procedure well. Scope In: Scope Out: Findings:                 A single area of ectopic gastric mucosa was found                            in the upper third of the esophagus.                           The examined esophagus was otherwise normal. No                            luminal narrowing, strictures, or rings noted. The                            scope was withdrawn. Dilation was performed with a                            Maloney dilator with no resistance at 54 Fr. The                            dilation site was examined following endoscope                            reinsertion and showed no bleeding, mucosal tear or                            perforation. Estimated blood loss: none.                           The Z-line was regular and was found 37 cm from the                            incisors.  Evidence of a gastrojejunostomy and sleeve                            gastrectomy were found in the gastric body. This                            was characterized by healthy appearing mucosa and                            visible sutures. The anastamosis was easily                            traversed. No retroflexion performed.                           Mildly erythematous mucosa without bleeding was                            found in the gastric body. Biopsies were taken with                            a cold forceps for Helicobacter pylori testing.                            Estimated blood loss was minimal.                           The examined jejunum was normal. This was biopsied                            with a cold forceps for evaluation of celiac                            disease. Estimated blood loss was minimal. Complications:            No immediate complications. Estimated Blood Loss:     Estimated blood loss was minimal. Impression:               - Ectopic gastric  mucosa in the upper third of the                            esophagus.                           - Normal esophagus. Dilated.                           - Z-line regular, 37 cm from the incisors.                           - A gastrojejunostomy and sleeve gastrectomy was                            found, characterized by healthy appearing mucosa  and visible sutures.                           - Erythematous mucosa in the gastric body. Biopsied.                           - Normal examined jejunum. Biopsied. Recommendation:           - Patient has a contact number available for                            emergencies. The signs and symptoms of potential                            delayed complications were discussed with the                            patient. Return to normal activities tomorrow.                            Written discharge instructions were provided to the                            patient.                           - Resume previous diet today.                           - Continue present medications.                           - Await pathology results.                           - Return to GI clinic at appointment to be                            scheduled.                           - Proceed with colonoscopy today. Gerrit Heck, MD 05/25/2019 10:20:11 AM

## 2019-05-26 ENCOUNTER — Other Ambulatory Visit: Payer: 59

## 2019-05-26 ENCOUNTER — Ambulatory Visit: Payer: 59 | Admitting: Family

## 2019-05-27 ENCOUNTER — Telehealth: Payer: Self-pay

## 2019-05-27 NOTE — Telephone Encounter (Signed)
  Follow up Call-  Call back number 05/25/2019  Post procedure Call Back phone  # [phone]  Permission to leave phone message Yes  Some recent data might be hidden     Patient questions:  Do you have a fever, pain , or abdominal swelling? No. Pain Score  0 *  Have you tolerated food without any problems? Yes.    Have you been able to return to your normal activities? Yes.    Do you have any questions about your discharge instructions: Diet   No. Medications  No. Follow up visit  No.  Do you have questions or concerns about your Care? No.  Actions: * If pain score is 4 or above: No action needed, pain <4.  1. Have you developed a fever since your procedure? no  2.   Have you had an respiratory symptoms (SOB or cough) since your procedure? no  3.   Have you tested positive for COVID 19 since your procedure no  4.   Have you had any family members/close contacts diagnosed with the COVID 19 since your procedure?  no   If yes to any of these questions please route to Joylene John, RN and Alphonsa Gin, Therapist, sports.

## 2019-05-27 NOTE — Telephone Encounter (Signed)
First post procedure follow up call, no answer 

## 2019-05-29 IMAGING — XA DG FLUORO GUIDE NDL PLC/BX
1 series · 1 of 1 positions shown · non-contrast
Comparison: none

CLINICAL DATA: Memory problems.  History of pseudotumor.

EXAM:
LUMBAR PUNCTURE UNDER FLUOROSCOPY
FLUOROSCOPY TIME:  19 seconds; 115 uBymI DAP
TECHNIQUE: The procedure, risks (including but not limited to bleeding,
infection, organ damage ), benefits, and alternatives were explained
to the patient. Questions regarding the procedure were encouraged
and answered. The patient understands and consents to the procedure.
An appropriate skin entry site was determined fluoroscopically.
Operator donned sterile gloves and mask. Skin site was marked, then
prepped with Betadine, draped in usual sterile fashion, and
infiltrated locally with 1% lidocaine. A 20 gauge spinal needle
advanced into the thecal sac at L3 from a left interlaminar
approach. Clear colorless CSF spontaneously returned, with opening
pressure of 30 cm water. 9ml CSF were collected and divided among 4
sterile vials for the requested laboratory studies. The needle was
then removed.
COMPLICATIONS:
None immediate

[Series 1: ortho standard · 1 of 1 slices shown]
[im 1/1]
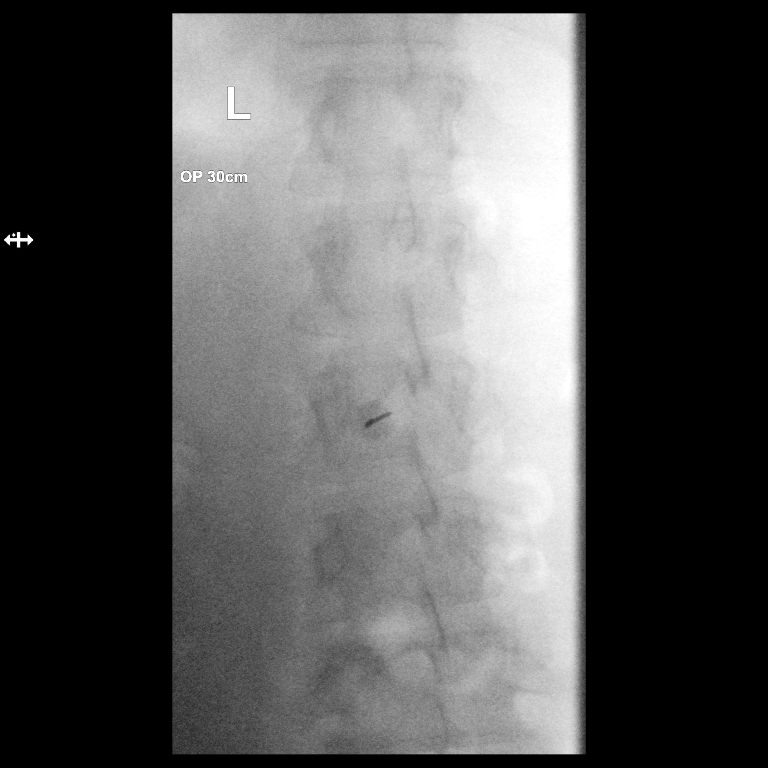

[1 of 1 positions shown; findings below may reference images not displayed]

IMPRESSION: 1. Technically successful lumbar puncture under fluoroscopy.
2. Elevated opening pressure, 30 cm3SN.

## 2019-06-09 ENCOUNTER — Telehealth: Payer: Self-pay

## 2019-06-09 NOTE — Telephone Encounter (Signed)
Left message for patient to call back to the office -patient has been scheduled for a f/u appt for post EGD on 07/01/2019 at 11:20 am;

## 2019-06-13 NOTE — Telephone Encounter (Signed)
Left message for the patient to call back to the office; 

## 2019-06-13 NOTE — Telephone Encounter (Signed)
Patient returned call to the office-patient confirmed that the appt date/time DO work for her on 07/01/2019 at 11:20 am; patient advised to call back to the office should questions/concerns arise; patient verbalized understanding of information;

## 2019-06-16 ENCOUNTER — Encounter: Payer: Self-pay | Admitting: Gastroenterology

## 2019-06-16 ENCOUNTER — Other Ambulatory Visit: Payer: Self-pay | Admitting: Family

## 2019-06-16 DIAGNOSIS — K912 Postsurgical malabsorption, not elsewhere classified: Secondary | ICD-10-CM

## 2019-06-16 DIAGNOSIS — D508 Other iron deficiency anemias: Secondary | ICD-10-CM

## 2019-06-17 ENCOUNTER — Ambulatory Visit: Payer: 59 | Admitting: Family

## 2019-06-17 ENCOUNTER — Other Ambulatory Visit: Payer: 59

## 2019-06-20 ENCOUNTER — Ambulatory Visit: Payer: 59 | Admitting: Family

## 2019-06-20 ENCOUNTER — Other Ambulatory Visit: Payer: 59

## 2019-06-24 ENCOUNTER — Telehealth: Payer: Self-pay | Admitting: Family

## 2019-06-24 ENCOUNTER — Encounter: Payer: Self-pay | Admitting: Family

## 2019-06-24 ENCOUNTER — Inpatient Hospital Stay (HOSPITAL_BASED_OUTPATIENT_CLINIC_OR_DEPARTMENT_OTHER): Payer: 59 | Admitting: Family

## 2019-06-24 ENCOUNTER — Other Ambulatory Visit: Payer: Self-pay

## 2019-06-24 ENCOUNTER — Inpatient Hospital Stay: Payer: 59 | Attending: Family

## 2019-06-24 VITALS — BP 147/86 | HR 81 | Temp 96.9°F | Resp 18 | Wt 219.0 lb

## 2019-06-24 DIAGNOSIS — K912 Postsurgical malabsorption, not elsewhere classified: Secondary | ICD-10-CM

## 2019-06-24 DIAGNOSIS — Z79899 Other long term (current) drug therapy: Secondary | ICD-10-CM | POA: Insufficient documentation

## 2019-06-24 DIAGNOSIS — Z9884 Bariatric surgery status: Secondary | ICD-10-CM | POA: Diagnosis not present

## 2019-06-24 DIAGNOSIS — R002 Palpitations: Secondary | ICD-10-CM | POA: Insufficient documentation

## 2019-06-24 DIAGNOSIS — D508 Other iron deficiency anemias: Secondary | ICD-10-CM

## 2019-06-24 DIAGNOSIS — R0602 Shortness of breath: Secondary | ICD-10-CM | POA: Insufficient documentation

## 2019-06-24 DIAGNOSIS — Z903 Acquired absence of stomach [part of]: Secondary | ICD-10-CM | POA: Diagnosis not present

## 2019-06-24 DIAGNOSIS — K9089 Other intestinal malabsorption: Secondary | ICD-10-CM | POA: Insufficient documentation

## 2019-06-24 LAB — CBC WITH DIFFERENTIAL (CANCER CENTER ONLY)
Abs Immature Granulocytes: 0.07 10*3/uL (ref 0.00–0.07)
Basophils Absolute: 0 10*3/uL (ref 0.0–0.1)
Basophils Relative: 1 %
Eosinophils Absolute: 0.1 10*3/uL (ref 0.0–0.5)
Eosinophils Relative: 1 %
HCT: 40.6 % (ref 36.0–46.0)
Hemoglobin: 12.6 g/dL (ref 12.0–15.0)
Immature Granulocytes: 1 %
Lymphocytes Relative: 28 %
Lymphs Abs: 2.2 10*3/uL (ref 0.7–4.0)
MCH: 25.1 pg — ABNORMAL LOW (ref 26.0–34.0)
MCHC: 31 g/dL (ref 30.0–36.0)
MCV: 80.9 fL (ref 80.0–100.0)
Monocytes Absolute: 0.5 10*3/uL (ref 0.1–1.0)
Monocytes Relative: 6 %
Neutro Abs: 5 10*3/uL (ref 1.7–7.7)
Neutrophils Relative %: 63 %
Platelet Count: 337 10*3/uL (ref 150–400)
RBC: 5.02 MIL/uL (ref 3.87–5.11)
WBC Count: 7.9 10*3/uL (ref 4.0–10.5)
nRBC: 0 % (ref 0.0–0.2)

## 2019-06-24 LAB — FERRITIN: Ferritin: 83 ng/mL (ref 11–307)

## 2019-06-24 LAB — RETICULOCYTES
Immature Retic Fract: 12.2 % (ref 2.3–15.9)
RBC.: 5.03 MIL/uL (ref 3.87–5.11)
Retic Count, Absolute: 40.7 10*3/uL (ref 19.0–186.0)
Retic Ct Pct: 0.8 % (ref 0.4–3.1)

## 2019-06-24 LAB — IRON AND TIBC
Iron: 65 ug/dL (ref 41–142)
Saturation Ratios: 22 % (ref 21–57)
TIBC: 293 ug/dL (ref 236–444)
UIBC: 227 ug/dL (ref 120–384)

## 2019-06-24 NOTE — Progress Notes (Signed)
Hematology and Oncology Follow Up Visit  Meagan Floyd [id number] Dec 28, 1965 53 y.o. 06/24/2019   Principle Diagnosis:  Iron deficiency anemia  Current Therapy:   IV iron as indicated    Interim History:  Meagan Floyd is here today for follow-up. She is doing well after receiving 2 doses of IV iron. Her Hgb is up to 12.6 from 8.8.  She states that her symptoms are improving but she still has some occasional palpitations and SOB with over exertion.  No episodes of bleeding, no bruising or petechiae.  No fever, chills, n/v, cough, rash, dizziness, chest pain, abdominal pain or changes in bowel or bladder habits.  No swelling, tenderness, numbness or tingling in her extremities.  She has a good appetite and is staying well hydrated. Her weight is stable.   ECOG Performance Status: 1 - Symptomatic but completely ambulatory  Medications:  Allergies as of 06/24/2019      Reactions   Belsomra [suvorexant] Other (See Comments)   paresthesias   Codeine Nausea Only   Penicillins Rash      Medication List       Accurate as of June 24, 2019  8:52 AM. If you have any questions, ask your nurse or doctor.        busPIRone 10 MG tablet Commonly known as: BUSPAR Take 10 mg by mouth 3 (three) times daily as needed.   DULoxetine 20 MG capsule Commonly known as: CYMBALTA TAKE 1 CAPSULE BY MOUTH EVERY DAY FOR 1 WEEK, THEN TAKE 2 CAPSULES BY MOUTH EVERY DAY What changed: See the new instructions.   pantoprazole 20 MG tablet Commonly known as: PROTONIX TAKE 1 TABLET BY MOUTH EVERY DAY       Allergies:  Allergies  Allergen Reactions  . Belsomra [Suvorexant] Other (See Comments)    paresthesias  . Codeine Nausea Only  . Penicillins Rash    Past Medical History, Surgical history, Social history, and Family History were reviewed and updated.  Review of Systems: All other 10 point review of systems is negative.   Physical Exam:  weight is 219 lb (99.3 kg). Her temporal  temperature is 96.9 F (36.1 C) (abnormal). Her blood pressure is 147/86 (abnormal) and her pulse is 81. Her respiration is 18 and oxygen saturation is 99%.   Wt Readings from Last 3 Encounters:  06/24/19 219 lb (99.3 kg)  05/25/19 215 lb (97.5 kg)  05/16/19 215 lb 8 oz (97.8 kg)    Ocular: Sclerae unicteric, pupils equal, round and reactive to light Ear-nose-throat: Oropharynx clear, dentition fair Lymphatic: No cervical or supraclavicular adenopathy Lungs no rales or rhonchi, good excursion bilaterally Heart regular rate and rhythm, no murmur appreciated Abd soft, nontender, positive bowel sounds, no liver or spleen tip palpated on exam, no fluid wave  MSK no focal spinal tenderness, no joint edema Neuro: non-focal, well-oriented, appropriate affect Breasts: Deferred   Lab Results  Component Value Date   WBC 7.9 06/24/2019   HGB 12.6 06/24/2019   HCT 40.6 06/24/2019   MCV 80.9 06/24/2019   PLT 337 06/24/2019   Lab Results  Component Value Date   FERRITIN <4 (L) 04/25/2019   IRON 20 (L) 04/25/2019   TIBC 463 (H) 04/25/2019   UIBC 443 (H) 04/25/2019   IRONPCTSAT 4 (L) 04/25/2019   Lab Results  Component Value Date   RETICCTPCT 0.8 06/24/2019   RBC 5.03 06/24/2019   No results found for: KPAFRELGTCHN, LAMBDASER, KAPLAMBRATIO No results found for: IGGSERUM, IGA, IGMSERUM No results found  for: [name], SPEI   Chemistry      Component Value Date/Time   NA 139 04/25/2019 1016   K 3.6 04/25/2019 1016   CL 104 04/25/2019 1016   CO2 26 04/25/2019 1016   BUN 16 04/25/2019 1016   CREATININE 0.62 04/25/2019 1016   CREATININE 0.64 03/08/2019 0815      Component Value Date/Time   CALCIUM 8.3 (L) 04/25/2019 1016   ALKPHOS 93 04/25/2019 1016   AST 12 (L) 04/25/2019 1016   ALT 8 04/25/2019 1016   BILITOT 0.3 04/25/2019 1016       Impression and Plan: Meagan Floyd is a very pleasant 53 yo caucasian female with  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  She has improved significantly with IV iron and is feeling much better.  We will see what her iron studies show and bring her back in for infusion if needed.  We will plan to see her back in another 3 months.  She will contact our office with any questions or concerns. We can certainly see her sooner if needed.   Emeline Gins, NP 9/25/20208:52 AM

## 2019-06-24 NOTE — Telephone Encounter (Signed)
Spoke with patient to confirm Dec appt per 9/25 LOS

## 2019-07-01 ENCOUNTER — Ambulatory Visit: Payer: 59 | Admitting: Gastroenterology

## 2019-07-18 ENCOUNTER — Ambulatory Visit: Payer: 59 | Admitting: Gastroenterology

## 2019-07-30 ENCOUNTER — Other Ambulatory Visit: Payer: Self-pay | Admitting: Physician Assistant

## 2019-07-30 DIAGNOSIS — F410 Panic disorder [episodic paroxysmal anxiety] without agoraphobia: Secondary | ICD-10-CM

## 2019-07-30 DIAGNOSIS — R0989 Other specified symptoms and signs involving the circulatory and respiratory systems: Secondary | ICD-10-CM

## 2019-07-30 DIAGNOSIS — Z636 Dependent relative needing care at home: Secondary | ICD-10-CM

## 2019-07-30 DIAGNOSIS — R198 Other specified symptoms and signs involving the digestive system and abdomen: Secondary | ICD-10-CM

## 2019-07-30 DIAGNOSIS — F411 Generalized anxiety disorder: Secondary | ICD-10-CM

## 2019-07-30 DIAGNOSIS — F331 Major depressive disorder, recurrent, moderate: Secondary | ICD-10-CM

## 2019-08-29 ENCOUNTER — Other Ambulatory Visit: Payer: Self-pay | Admitting: Family Medicine

## 2019-08-29 DIAGNOSIS — F410 Panic disorder [episodic paroxysmal anxiety] without agoraphobia: Secondary | ICD-10-CM

## 2019-08-29 DIAGNOSIS — Z636 Dependent relative needing care at home: Secondary | ICD-10-CM

## 2019-08-29 DIAGNOSIS — F331 Major depressive disorder, recurrent, moderate: Secondary | ICD-10-CM

## 2019-09-16 ENCOUNTER — Inpatient Hospital Stay: Payer: 59

## 2019-09-16 ENCOUNTER — Other Ambulatory Visit: Payer: Self-pay

## 2019-09-16 ENCOUNTER — Telehealth: Payer: Self-pay | Admitting: Family

## 2019-09-16 ENCOUNTER — Inpatient Hospital Stay: Payer: 59 | Attending: Family | Admitting: Family

## 2019-09-16 ENCOUNTER — Encounter: Payer: Self-pay | Admitting: Family

## 2019-09-16 VITALS — BP 142/84 | HR 79 | Temp 97.3°F | Resp 18 | Ht 64.0 in | Wt 226.0 lb

## 2019-09-16 DIAGNOSIS — Z79899 Other long term (current) drug therapy: Secondary | ICD-10-CM | POA: Insufficient documentation

## 2019-09-16 DIAGNOSIS — D509 Iron deficiency anemia, unspecified: Secondary | ICD-10-CM | POA: Diagnosis not present

## 2019-09-16 DIAGNOSIS — D508 Other iron deficiency anemias: Secondary | ICD-10-CM

## 2019-09-16 DIAGNOSIS — K9089 Other intestinal malabsorption: Secondary | ICD-10-CM | POA: Insufficient documentation

## 2019-09-16 DIAGNOSIS — R519 Headache, unspecified: Secondary | ICD-10-CM | POA: Diagnosis not present

## 2019-09-16 DIAGNOSIS — K912 Postsurgical malabsorption, not elsewhere classified: Secondary | ICD-10-CM

## 2019-09-16 DIAGNOSIS — Z903 Acquired absence of stomach [part of]: Secondary | ICD-10-CM

## 2019-09-16 DIAGNOSIS — R5383 Other fatigue: Secondary | ICD-10-CM | POA: Insufficient documentation

## 2019-09-16 DIAGNOSIS — Z9884 Bariatric surgery status: Secondary | ICD-10-CM | POA: Insufficient documentation

## 2019-09-16 DIAGNOSIS — M7989 Other specified soft tissue disorders: Secondary | ICD-10-CM | POA: Diagnosis not present

## 2019-09-16 LAB — CBC WITH DIFFERENTIAL (CANCER CENTER ONLY)
Abs Immature Granulocytes: 0.08 10*3/uL — ABNORMAL HIGH (ref 0.00–0.07)
Basophils Absolute: 0 10*3/uL (ref 0.0–0.1)
Basophils Relative: 1 %
Eosinophils Absolute: 0.1 10*3/uL (ref 0.0–0.5)
Eosinophils Relative: 1 %
HCT: 40.8 % (ref 36.0–46.0)
Hemoglobin: 12.7 g/dL (ref 12.0–15.0)
Immature Granulocytes: 1 %
Lymphocytes Relative: 26 %
Lymphs Abs: 2.2 10*3/uL (ref 0.7–4.0)
MCH: 27 pg (ref 26.0–34.0)
MCHC: 31.1 g/dL (ref 30.0–36.0)
MCV: 86.6 fL (ref 80.0–100.0)
Monocytes Absolute: 0.5 10*3/uL (ref 0.1–1.0)
Monocytes Relative: 6 %
Neutro Abs: 5.5 10*3/uL (ref 1.7–7.7)
Neutrophils Relative %: 65 %
Platelet Count: 341 10*3/uL (ref 150–400)
RBC: 4.71 MIL/uL (ref 3.87–5.11)
RDW: 13.6 % (ref 11.5–15.5)
WBC Count: 8.3 10*3/uL (ref 4.0–10.5)
nRBC: 0 % (ref 0.0–0.2)

## 2019-09-16 LAB — RETICULOCYTES
Immature Retic Fract: 10.5 % (ref 2.3–15.9)
RBC.: 4.65 MIL/uL (ref 3.87–5.11)
Retic Count, Absolute: 47 10*3/uL (ref 19.0–186.0)
Retic Ct Pct: 1 % (ref 0.4–3.1)

## 2019-09-16 LAB — IRON AND TIBC
Iron: 48 ug/dL (ref 41–142)
Saturation Ratios: 14 % — ABNORMAL LOW (ref 21–57)
TIBC: 343 ug/dL (ref 236–444)
UIBC: 294 ug/dL (ref 120–384)

## 2019-09-16 LAB — FERRITIN: Ferritin: 33 ng/mL (ref 11–307)

## 2019-09-16 NOTE — Progress Notes (Signed)
Hematology and Oncology Follow Up Visit  Meagan Floyd [id number] Feb 11, 1966 53 y.o. 09/16/2019   Principle Diagnosis:  Iron deficiency anemia  Current Therapy:   IV iron as indicated    Interim History:  Meagan Floyd had a telephone visit for follow-up today. She is doing fairly well but has had a lingering headache for the last 5 weeks. Tylenol eases the pain but it does return. Had history of migraines 26 years ago. Has not seen a neurologist since that time.  No vision loss or changes.  She is feeling fatigued.  No fever, chills, n/v, cough, rash, SOB, chest pain, palpitations, abdominal pain or changes in bowel or bladder habits.  No tenderness, numbness or tingling in her extremities.  She has swelling in her fingers in the morning that dissipates throughout the day.   No falls or syncopal episodes.    She has maintained a good appetite and is staying well hydrated. Her weight is stable.   ECOG Performance Status: 1 - Symptomatic but completely ambulatory  Medications:  Allergies as of 09/16/2019      Reactions   Belsomra [suvorexant] Other (See Comments)   paresthesias   Codeine Nausea Only   Penicillins Rash      Medication List       Accurate as of September 16, 2019  8:51 AM. If you have any questions, ask your nurse or doctor.        busPIRone 10 MG tablet Commonly known as: BUSPAR Take 10 mg by mouth 3 (three) times daily as needed.   DULoxetine 20 MG capsule Commonly known as: CYMBALTA TAKE 2 CAPSULES BY MOUTH EVERY DAY   pantoprazole 20 MG tablet Commonly known as: PROTONIX TAKE 1 TABLET BY MOUTH EVERY DAY       Allergies:  Allergies  Allergen Reactions  . Belsomra [Suvorexant] Other (See Comments)    paresthesias  . Codeine Nausea Only  . Penicillins Rash    Past Medical History, Surgical history, Social history, and Family History were reviewed and updated.  Review of Systems: All other 10 point review of systems is negative.    Physical Exam:  vitals were not taken for this visit.   Wt Readings from Last 3 Encounters:  06/24/19 219 lb (99.3 kg)  05/25/19 215 lb (97.5 kg)  05/16/19 215 lb 8 oz (97.8 kg)    Lab Results  Component Value Date   WBC 8.3 09/16/2019   HGB 12.7 09/16/2019   HCT 40.8 09/16/2019   MCV 86.6 09/16/2019   PLT 341 09/16/2019   Lab Results  Component Value Date   FERRITIN 83 06/24/2019   IRON 65 06/24/2019   TIBC 293 06/24/2019   UIBC 227 06/24/2019   IRONPCTSAT 22 06/24/2019   Lab Results  Component Value Date   RETICCTPCT 1.0 09/16/2019   RBC 4.65 09/16/2019   No results found for: KPAFRELGTCHN, LAMBDASER, KAPLAMBRATIO No results found for: IGGSERUM, IGA, IGMSERUM No results found for: [name], SPEI   Chemistry      Component Value Date/Time   NA 139 04/25/2019 1016   K 3.6 04/25/2019 1016   CL 104 04/25/2019 1016   CO2 26 04/25/2019 1016   BUN 16 04/25/2019 1016   CREATININE 0.62 04/25/2019 1016   CREATININE 0.64 03/08/2019 0815      Component Value Date/Time   CALCIUM 8.3 (L) 04/25/2019 1016   ALKPHOS 93 04/25/2019 1016   AST 12 (L) 04/25/2019 1016  ALT 8 04/25/2019 1016   BILITOT 0.3 04/25/2019 1016       Impression and Plan:  Meagan Floyd is a very pleasant 53 yo caucasian female with 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 Her biggest issue at this time is the persistent headache. She will follow-up with her PCP for further workup.  We will see what her iron studies look like and bring her back in for infusion if needed.  We will go ahead and plan to see her back in another 3 months.  She will contact our office with any questions or concerns. We can certainly see her sooner if needed.   Laverna Peace, NP 12/18/20208:51 AM

## 2019-09-16 NOTE — Telephone Encounter (Signed)
Appointments scheduled and patient has been notified per 12/18 los

## 2019-09-21 ENCOUNTER — Inpatient Hospital Stay: Payer: 59

## 2019-09-21 ENCOUNTER — Other Ambulatory Visit: Payer: Self-pay

## 2019-09-21 VITALS — BP 151/97 | HR 85 | Temp 97.7°F | Resp 17 | Ht 64.0 in | Wt 225.0 lb

## 2019-09-21 DIAGNOSIS — K912 Postsurgical malabsorption, not elsewhere classified: Secondary | ICD-10-CM

## 2019-09-21 DIAGNOSIS — D509 Iron deficiency anemia, unspecified: Secondary | ICD-10-CM | POA: Diagnosis not present

## 2019-09-21 DIAGNOSIS — Z903 Acquired absence of stomach [part of]: Secondary | ICD-10-CM

## 2019-09-21 DIAGNOSIS — D508 Other iron deficiency anemias: Secondary | ICD-10-CM

## 2019-09-21 MED ORDER — SODIUM CHLORIDE 0.9 % IV SOLN
510.0000 mg | Freq: Once | INTRAVENOUS | Status: AC
  Administered 2019-09-21: 510 mg via INTRAVENOUS
  Filled 2019-09-21: qty 17

## 2019-09-21 NOTE — Patient Instructions (Signed)

## 2019-09-28 ENCOUNTER — Inpatient Hospital Stay: Payer: 59

## 2019-09-28 ENCOUNTER — Other Ambulatory Visit: Payer: Self-pay

## 2019-09-28 VITALS — BP 156/91 | HR 63 | Temp 97.1°F | Resp 16

## 2019-09-28 DIAGNOSIS — Z903 Acquired absence of stomach [part of]: Secondary | ICD-10-CM

## 2019-09-28 DIAGNOSIS — K912 Postsurgical malabsorption, not elsewhere classified: Secondary | ICD-10-CM

## 2019-09-28 DIAGNOSIS — D508 Other iron deficiency anemias: Secondary | ICD-10-CM

## 2019-09-28 DIAGNOSIS — D509 Iron deficiency anemia, unspecified: Secondary | ICD-10-CM | POA: Diagnosis not present

## 2019-09-28 MED ORDER — SODIUM CHLORIDE 0.9 % IV SOLN
510.0000 mg | Freq: Once | INTRAVENOUS | Status: AC
  Administered 2019-09-28: 510 mg via INTRAVENOUS
  Filled 2019-09-28: qty 510

## 2019-09-28 MED ORDER — SODIUM CHLORIDE 0.9 % IV SOLN
INTRAVENOUS | Status: DC
  Filled 2019-09-28: qty 250

## 2019-09-28 NOTE — Patient Instructions (Signed)

## 2019-10-29 ENCOUNTER — Other Ambulatory Visit: Payer: Self-pay | Admitting: Family Medicine

## 2019-10-29 DIAGNOSIS — R0989 Other specified symptoms and signs involving the circulatory and respiratory systems: Secondary | ICD-10-CM

## 2019-10-29 DIAGNOSIS — R198 Other specified symptoms and signs involving the digestive system and abdomen: Secondary | ICD-10-CM

## 2019-11-03 ENCOUNTER — Emergency Department (INDEPENDENT_AMBULATORY_CARE_PROVIDER_SITE_OTHER)
Admission: EM | Admit: 2019-11-03 | Discharge: 2019-11-03 | Disposition: A | Discharge status: Home / Self Care | Payer: 59 | Source: Home / Self Care | Attending: Family Medicine | Admitting: Family Medicine

## 2019-11-03 ENCOUNTER — Other Ambulatory Visit: Payer: Self-pay

## 2019-11-03 DIAGNOSIS — L03213 Periorbital cellulitis: Secondary | ICD-10-CM | POA: Diagnosis not present

## 2019-11-03 MED ORDER — CLINDAMYCIN HCL 300 MG PO CAPS: ORAL_CAPSULE | ORAL | 0 refills | Status: DC

## 2019-11-03 NOTE — ED Triage Notes (Signed)
Started as a stye 14 days ago.  Has been using tobramycin ointment, and it is not getting better.  The last 2 days more swollen.  Extremely sore, and has a blister on the outer edge.  Feels a shock in the eye for just a second 2-3 times a day.

## 2019-11-03 NOTE — ED Provider Notes (Signed)
[facility]    CSN: [id number] Arrival date & time: 11/03/19  0917      History   Chief Complaint Chief Complaint  Patient presents with  . Facial Swelling    HPI Meagan Floyd is a 54 y.o. female.   Patient developed soreness and swelling at her medial right lower eyelid about two weeks ago.  The swelling resolved but then moved centrally on her lower eyelid.  During the past 2 days she has had increased pain/swelling of her right lower lid despite using tobramycin ophthalmic suspension.  She denies changes in vision or pain with eye movement.  The history is provided by the patient.  Eye Problem Location:  Right eye Quality:  Aching Severity:  Mild Onset quality:  Gradual Duration:  1 week Timing:  Constant Progression:  Worsening Chronicity:  New Context: not burn, not contact lens problem, not direct trauma, not foreign body and not scratch   Relieved by:  Nothing Worsened by:  Contact Ineffective treatments: tobramycin suspension. Associated symptoms: crusting, discharge, inflammation, redness, swelling and tearing   Associated symptoms: no blurred vision, no decreased vision, no double vision, no facial rash, no headaches, no itching, no nausea, no photophobia and no scotomas   Risk factors: no recent URI     Past Medical History:  Diagnosis Date  . Anemia   . Chronic headache   . Depression   . Depression   . Empty sella turcica ([place]) 08/14/2017   Partial on MR Brain 08/13/2017  . History of gastric bypass 04/25/2019  . Hypertension   . 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 Microcytic anemia 08/11/2017  . Obesity   . Secondary hyperparathyroidism, non-renal ([place]) 03/13/2019  . Vitamin A deficiency 03/13/2019    Patient Active Problem List   Diagnosis Date Noted  . IDA (iron deficiency anemia) 04/25/2019  . History of gastric bypass 04/25/2019  . 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 Dysphagia 04/13/2019  . Globus sensation 04/13/2019  .  Tachycardia with heart rate 100-120 beats per minute 04/13/2019  . Postmenopausal 04/13/2019  . DOE (dyspnea on exertion) 04/13/2019  . Vitamin A deficiency 03/13/2019  . Secondary hyperparathyroidism, non-renal ([place]) 03/13/2019  . Caregiver stress 08/10/2018  . Generalized anxiety disorder with panic attacks 07/13/2018  . Pseudotumor cerebri 02/04/2018  . Chronic iron deficiency anemia 09/18/2017  . Empty sella turcica ([place]) 08/14/2017  . Word finding difficulty 08/11/2017  . Memory loss of unknown cause 08/11/2017  . Insomnia due to other mental disorder 08/11/2017  . ESR raised 08/11/2017  . Moderate episode of recurrent major depressive disorder ([place]) 08/10/2017  . Obesity (BMI 30-39.9) 08/29/2015  . 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 Left shoulder pain 11/11/2013    Past Surgical History:  Procedure Laterality Date  . ABLATION    . GASTRIC BYPASS    . SHOULDER ARTHROSCOPY    . TUMOR REMOVAL     right hand    OB History    Gravida  2   Para  2   Term      Preterm      AB      Living  2     SAB      TAB      Ectopic      Multiple      Live Births               Home Medications    Prior to Admission medications   Medication Sig Start  Date End Date Taking? Authorizing Provider  clindamycin (CLEOCIN) 300 MG capsule Take one cap PO Q8hr. 11/03/19   Kandra Nicolas, MD  DULoxetine (CYMBALTA) 20 MG capsule TAKE 2 CAPSULES BY MOUTH EVERY DAY 08/29/19   Hali Marry, MD    Family History Family History  Problem Relation Age of Onset  . Hypertension Mother   . Depression Mother   . Cancer Other        skin  . Hypertension Father   . Dementia Father   . Bipolar disorder Sister   . Melanoma Maternal Grandmother   . Colon cancer Neg Hx   . Esophageal cancer Neg Hx     Social History Social History   Tobacco Use  . Smoking status: Never Smoker  . Smokeless tobacco: Never Used  Substance Use Topics  .  Alcohol use: No  . Drug use: No     Allergies   Belsomra [suvorexant], Codeine, and Penicillins   Review of Systems Review of Systems  Constitutional: Negative for chills, diaphoresis, fatigue and fever.  Eyes: Positive for discharge and redness. Negative for blurred vision, double vision, photophobia and itching.  Gastrointestinal: Negative for nausea.  Neurological: Negative for headaches.  All other systems reviewed and are negative.    Physical Exam Triage Vital Signs ED Triage Vitals  Enc Vitals Group     BP 11/03/19 0933 (!) 144/94     Pulse Rate 11/03/19 0933 74     Resp 11/03/19 0933 20     Temp 11/03/19 0933 98 F (36.7 C)     Temp Source 11/03/19 0933 Oral     SpO2 11/03/19 0933 98 %     Weight 11/03/19 0934 226 lb (102.5 kg)     Height 11/03/19 0934 5' 4"  (1.626 m)     Head Circumference --      Peak Flow --      Pain Score 11/03/19 0934 5     Pain Loc --      Pain Edu? --      Excl. in [place]? --    No data found.  Updated Vital Signs BP (!) 144/94 (BP Location: Right Arm)   Pulse 74   Temp 98 F (36.7 C) (Oral)   Resp 20   Ht 5' 4"  (1.626 m)   Wt 102.5 kg   LMP 01/27/2013   SpO2 98%   BMI 38.79 kg/m   Visual Acuity Right Eye Distance: 20/40 Left Eye Distance: 20/40 Bilateral Distance: 20/20  Right Eye Near:   Left Eye Near:    Bilateral Near:     Physical Exam Vitals and nursing note reviewed.  Constitutional:      General: She is not in acute distress. HENT:     Head: Normocephalic.     Right Ear: External ear normal.     Left Ear: External ear normal.     Nose: Nose normal.  Eyes:     General:        Right eye: Hordeolum present.     Extraocular Movements: Extraocular movements intact.     Conjunctiva/sclera:     Right eye: Right conjunctiva is injected.     Pupils: Pupils are equal, round, and reactive to light.      Comments: Mild swelling/erythema/tenderness right lower eyelid.  Small internal hordeolum noted laterally  right lower eyelid.  Minimal injection right conjunctivae. No photophobiea.  Cardiovascular:     Rate and Rhythm: Normal rate.  Pulmonary:  Effort: Pulmonary effort is normal.  Skin:    General: Skin is warm and dry.  Neurological:     Mental Status: She is alert.      UC Treatments / Results  Labs (all labs ordered are listed, but only abnormal results are displayed) Labs Reviewed - No data to display  EKG   Radiology No results found.  Procedures Procedures (including critical care time)  Medications Ordered in UC Medications - No data to display  Initial Impression / Assessment and Plan / UC Course  I have reviewed the triage vital signs and the nursing notes.  Pertinent labs & imaging results that were available during my care of the patient were reviewed by me and considered in my medical decision making (see chart for details).    Begin Clindamycin. Followup with ophthalmologist in about 4 days.   Final Clinical Impressions(s) / UC Diagnoses   Final diagnoses:  Preseptal cellulitis of right lower eyelid     Discharge Instructions     Begin applying a warm compress several times daily. If symptoms become significantly worse during the night or over the weekend, proceed to the local emergency room.    ED Prescriptions    Medication Sig Dispense Auth. Provider   clindamycin (CLEOCIN) 300 MG capsule Take one cap PO Q8hr. 21 capsule Kandra Nicolas, MD        Kandra Nicolas, MD 11/03/19 1004

## 2019-11-03 NOTE — Discharge Instructions (Addendum)
Begin applying a warm compress several times daily. If symptoms become significantly worse during the night or over the weekend, proceed to the local emergency room.

## 2019-11-07 ENCOUNTER — Telehealth: Payer: Self-pay | Admitting: Family

## 2019-11-07 ENCOUNTER — Telehealth: Payer: Self-pay | Admitting: Hematology & Oncology

## 2019-11-07 NOTE — Telephone Encounter (Signed)
Patient advised of appointments being rescheduled due to provider PAL 3/19. Letter/calendar mailed

## 2019-12-14 ENCOUNTER — Inpatient Hospital Stay: Payer: 59 | Admitting: Family

## 2019-12-14 ENCOUNTER — Inpatient Hospital Stay: Payer: 59 | Attending: Family

## 2019-12-15 ENCOUNTER — Other Ambulatory Visit: Payer: 59

## 2019-12-15 ENCOUNTER — Ambulatory Visit: Payer: 59 | Admitting: Family

## 2019-12-16 ENCOUNTER — Other Ambulatory Visit: Payer: 59

## 2019-12-16 ENCOUNTER — Ambulatory Visit: Payer: 59 | Admitting: Family

## 2020-03-22 ENCOUNTER — Encounter: Payer: Self-pay | Admitting: Medical-Surgical

## 2020-03-22 ENCOUNTER — Ambulatory Visit (INDEPENDENT_AMBULATORY_CARE_PROVIDER_SITE_OTHER): Payer: 59 | Admitting: Medical-Surgical

## 2020-03-22 VITALS — BP 143/89 | HR 71 | Temp 98.2°F | Ht 64.25 in | Wt 227.2 lb

## 2020-03-22 DIAGNOSIS — Z636 Dependent relative needing care at home: Secondary | ICD-10-CM | POA: Diagnosis not present

## 2020-03-22 DIAGNOSIS — F411 Generalized anxiety disorder: Secondary | ICD-10-CM

## 2020-03-22 DIAGNOSIS — Z1231 Encounter for screening mammogram for malignant neoplasm of breast: Secondary | ICD-10-CM

## 2020-03-22 DIAGNOSIS — R41840 Attention and concentration deficit: Secondary | ICD-10-CM

## 2020-03-22 DIAGNOSIS — Z7689 Persons encountering health services in other specified circumstances: Secondary | ICD-10-CM

## 2020-03-22 DIAGNOSIS — Z1159 Encounter for screening for other viral diseases: Secondary | ICD-10-CM

## 2020-03-22 DIAGNOSIS — F331 Major depressive disorder, recurrent, moderate: Secondary | ICD-10-CM

## 2020-03-22 DIAGNOSIS — F410 Panic disorder [episodic paroxysmal anxiety] without agoraphobia: Secondary | ICD-10-CM

## 2020-03-22 DIAGNOSIS — T753XXA Motion sickness, initial encounter: Secondary | ICD-10-CM

## 2020-03-22 DIAGNOSIS — Z114 Encounter for screening for human immunodeficiency virus [HIV]: Secondary | ICD-10-CM

## 2020-03-22 MED ORDER — DULOXETINE HCL 20 MG PO CPEP: 40.0000 mg | ORAL_CAPSULE | Freq: Every day | ORAL | 1 refills | Status: DC

## 2020-03-22 MED ORDER — SCOPOLAMINE 1 MG/3DAYS TD PT72: 1.0000 | MEDICATED_PATCH | TRANSDERMAL | 3 refills | Status: DC

## 2020-03-22 NOTE — Patient Instructions (Signed)
DASH Eating Plan DASH stands for "Dietary Approaches to Stop Hypertension." The DASH eating plan is a healthy eating plan that has been shown to reduce high blood pressure (hypertension). It may also reduce your risk for type 2 diabetes, heart disease, and stroke. The DASH eating plan may also help with weight loss. What are tips for following this plan?  General guidelines  Avoid eating more than 2,300 mg (milligrams) of salt (sodium) a day. If you have hypertension, you may need to reduce your sodium intake to 1,500 mg a day.  Limit alcohol intake to no more than 1 drink a day for nonpregnant women and 2 drinks a day for men. One drink equals 12 oz of beer, 5 oz of wine, or 1 oz of hard liquor.  Work with your health care provider to maintain a healthy body weight or to lose weight. Ask what an ideal weight is for you.  Get at least 30 minutes of exercise that causes your heart to beat faster (aerobic exercise) most days of the week. Activities may include walking, swimming, or biking.  Work with your health care provider or diet and nutrition specialist (dietitian) to adjust your eating plan to your individual calorie needs. Reading food labels   Check food labels for the amount of sodium per serving. Choose foods with less than 5 percent of the Daily Value of sodium. Generally, foods with less than 300 mg of sodium per serving fit into this eating plan.  To find whole grains, look for the word "whole" as the first word in the ingredient list. Shopping  Buy products labeled as "low-sodium" or "no salt added."  Buy fresh foods. Avoid canned foods and premade or frozen meals. Cooking  Avoid adding salt when cooking. Use salt-free seasonings or herbs instead of table salt or sea salt. Check with your health care provider or pharmacist before using salt substitutes.  Do not fry foods. Cook foods using healthy methods such as baking, boiling, grilling, and broiling instead.  Cook with  heart-healthy oils, such as olive, canola, soybean, or sunflower oil. Meal planning  Eat a balanced diet that includes: ? 5 or more servings of fruits and vegetables each day. At each meal, try to fill half of your plate with fruits and vegetables. ? Up to 6-8 servings of whole grains each day. ? Less than 6 oz of lean meat, poultry, or fish each day. A 3-oz serving of meat is about the same size as a deck of cards. One egg equals 1 oz. ? 2 servings of low-fat dairy each day. ? A serving of nuts, seeds, or beans 5 times each week. ? Heart-healthy fats. Healthy fats called Omega-3 fatty acids are found in foods such as flaxseeds and coldwater fish, like sardines, salmon, and mackerel.  Limit how much you eat of the following: ? Canned or prepackaged foods. ? Food that is high in trans fat, such as fried foods. ? Food that is high in saturated fat, such as fatty meat. ? Sweets, desserts, sugary drinks, and other foods with added sugar. ? Full-fat dairy products.  Do not salt foods before eating.  Try to eat at least 2 vegetarian meals each week.  Eat more home-cooked food and less restaurant, buffet, and fast food.  When eating at a restaurant, ask that your food be prepared with less salt or no salt, if possible. What foods are recommended? The items listed may not be a complete list. Talk with your dietitian about   what dietary choices are best for you. Grains Whole-grain or whole-wheat bread. Whole-grain or whole-wheat pasta. Brown rice. Oatmeal. Quinoa. Bulgur. Whole-grain and low-sodium cereals. Pita bread. Low-fat, low-sodium crackers. Whole-wheat flour tortillas. Vegetables Fresh or frozen vegetables (raw, steamed, roasted, or grilled). Low-sodium or reduced-sodium tomato and vegetable juice. Low-sodium or reduced-sodium tomato sauce and tomato paste. Low-sodium or reduced-sodium canned vegetables. Fruits All fresh, dried, or frozen fruit. Canned fruit in natural juice (without  added sugar). Meat and other protein foods Skinless chicken or turkey. Ground chicken or turkey. Pork with fat trimmed off. Fish and seafood. Egg whites. Dried beans, peas, or lentils. Unsalted nuts, nut butters, and seeds. Unsalted canned beans. Lean cuts of beef with fat trimmed off. Low-sodium, lean deli meat. Dairy Low-fat (1%) or fat-free (skim) milk. Fat-free, low-fat, or reduced-fat cheeses. Nonfat, low-sodium ricotta or cottage cheese. Low-fat or nonfat yogurt. Low-fat, low-sodium cheese. Fats and oils Soft margarine without trans fats. Vegetable oil. Low-fat, reduced-fat, or light mayonnaise and salad dressings (reduced-sodium). Canola, safflower, olive, soybean, and sunflower oils. Avocado. Seasoning and other foods Herbs. Spices. Seasoning mixes without salt. Unsalted popcorn and pretzels. Fat-free sweets. What foods are not recommended? The items listed may not be a complete list. Talk with your dietitian about what dietary choices are best for you. Grains Baked goods made with fat, such as croissants, muffins, or some breads. Dry pasta or rice meal packs. Vegetables Creamed or fried vegetables. Vegetables in a cheese sauce. Regular canned vegetables (not low-sodium or reduced-sodium). Regular canned tomato sauce and paste (not low-sodium or reduced-sodium). Regular tomato and vegetable juice (not low-sodium or reduced-sodium). Pickles. Olives. Fruits Canned fruit in a light or heavy syrup. Fried fruit. Fruit in cream or butter sauce. Meat and other protein foods Fatty cuts of meat. Ribs. Fried meat. Bacon. Sausage. Bologna and other processed lunch meats. Salami. Fatback. Hotdogs. Bratwurst. Salted nuts and seeds. Canned beans with added salt. Canned or smoked fish. Whole eggs or egg yolks. Chicken or turkey with skin. Dairy Whole or 2% milk, cream, and half-and-half. Whole or full-fat cream cheese. Whole-fat or sweetened yogurt. Full-fat cheese. Nondairy creamers. Whipped toppings.  Processed cheese and cheese spreads. Fats and oils Butter. Stick margarine. Lard. Shortening. Ghee. Bacon fat. Tropical oils, such as coconut, palm kernel, or palm oil. Seasoning and other foods Salted popcorn and pretzels. Onion salt, garlic salt, seasoned salt, table salt, and sea salt. Worcestershire sauce. Tartar sauce. Barbecue sauce. Teriyaki sauce. Soy sauce, including reduced-sodium. Steak sauce. Canned and packaged gravies. Fish sauce. Oyster sauce. Cocktail sauce. Horseradish that you find on the shelf. Ketchup. Mustard. Meat flavorings and tenderizers. Bouillon cubes. Hot sauce and Tabasco sauce. Premade or packaged marinades. Premade or packaged taco seasonings. Relishes. Regular salad dressings. Where to find more information:  National Heart, Lung, and Blood Institute: www.nhlbi.nih.gov  American Heart Association: www.heart.org Summary  The DASH eating plan is a healthy eating plan that has been shown to reduce high blood pressure (hypertension). It may also reduce your risk for type 2 diabetes, heart disease, and stroke.  With the DASH eating plan, you should limit salt (sodium) intake to 2,300 mg a day. If you have hypertension, you may need to reduce your sodium intake to 1,500 mg a day.  When on the DASH eating plan, aim to eat more fresh fruits and vegetables, whole grains, lean proteins, low-fat dairy, and heart-healthy fats.  Work with your health care provider or diet and nutrition specialist (dietitian) to adjust your eating plan to your   individual calorie needs. This information is not intended to replace advice given to you by your health care provider. Make sure you discuss any questions you have with your health care provider. Document Revised: 08/28/2017 Document Reviewed: 09/08/2016 Elsevier Patient Education  2020 Elsevier Inc.  

## 2020-03-22 NOTE — Progress Notes (Signed)
Subjective:    CC: Establish care, depression follow-up  HPI: Very pleasant 54 year old female presenting today to establish care with new PCP and discuss depression.   Depression-currently taking Cymbalta 40 mg daily.  She has been on this dose for a while and feels that it is fairly effective.  She notes that if she misses a dose of her Cymbalta, the next morning she will wake up with significant dizziness and nausea.  She is curious if there is another option that will not produce these symptoms after missing a dose.  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  Her father also suffers from dementia.  Her daughter, Deloria Lair, has recently graduated high school and is going to college in the fall.  Next month the family is taking a vacation to [place].  Denies suicidal and homicidal ideation.  Elevated blood pressure-blood pressure noted to be elevated at 143/89.  Looks like it was elevated in February as well.  Patient states these slightly elevated readings are normal for her.  She does not add salt to her food or eat a lot of processed/fast foods.  She walks approximately 7000 steps per day but no other exercise.  Inattention-patient reports that she has had difficulty with attention for many years.  She notes now that she has difficulty with staying on task and being able to sit through meetings without constant fidgeting.  She is never been tested or treated for inattention but is very interested in getting testing to see if she would benefit from medication.  Motion sickness-with her trip to [place] planned, she will be spending quite a bit of time on the boat.  She is worried about motion sickness and requests that I send in a prescription for scopolamine so that she can enjoy her vacation to the fullest.  I reviewed the past medical history, family history, social history, surgical history, and allergies today and no  changes were needed.  Please see the problem list section below in epic for further details.  Past Medical History: Past Medical History:  Diagnosis Date  . Anemia   . Chronic headache   . Depression   . Depression   . 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 History of gastric bypass 04/25/2019  . Hypertension   . 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 Microcytic anemia 08/11/2017  . Obesity   . Secondary hyperparathyroidism, non-renal ([facility]) 03/13/2019  . Vitamin A deficiency 03/13/2019   Past Surgical History: Past Surgical History:  Procedure Laterality Date  . ABLATION    . GASTRIC BYPASS    . SHOULDER ARTHROSCOPY    . TUMOR REMOVAL     right hand   Social History: Social History   Socioeconomic History  . Marital status: Married    Spouse name: Not on file  . Number of children: 2  . Years of education: Not on file  . Highest education level: Not on file  Occupational History  . Occupation: Human resources  Tobacco Use  . Smoking status: Never Smoker  . Smokeless tobacco: Never Used  Vaping Use  . Vaping Use: Never used  Substance and Sexual Activity  . Alcohol use: No  . Drug use: No  . Sexual activity: Yes    Birth control/protection: Surgical    Comment: vasectomy  Other Topics Concern  . Not on file  Social History Narrative  . Not on file   Social Determinants of Health   Financial Resource Strain:   . Difficulty of Paying Living Expenses:   Food Insecurity:   . Worried About Charity fundraiser in the Last Year:   . Arboriculturist in the Last Year:   Transportation Needs:   . Film/video editor (Medical):   Marland Kitchen Lack of Transportation (Non-Medical):   Physical Activity:   . Days of Exercise per Week:   . Minutes of Exercise per Session:   Stress:   . Feeling of Stress :   Social Connections:   . Frequency of Communication with Friends and Family:   . Frequency of Social Gatherings with Friends and Family:    . Attends Religious Services:   . Active Member of Clubs or Organizations:   . Attends Archivist Meetings:   Marland Kitchen Marital Status:    Family History: Family History  Problem Relation Age of Onset  . Hypertension Mother   . Depression Mother   . Cancer Other        skin  . Hypertension Father   . Dementia Father   . Bipolar disorder Sister   . Melanoma Maternal Grandmother   . Colon cancer Neg Hx   . Esophageal cancer Neg Hx    Allergies: Allergies  Allergen Reactions  . Belsomra [Suvorexant] Other (See Comments)    paresthesias  . Codeine Nausea Only  . Penicillins Rash   Medications: See med rec.  Review of Systems: See HPI for pertinent positives and negatives.   Depression screen [facility] 2/9 03/22/2020 01/26/2019 08/10/2018 07/13/2018 09/17/2017  Decreased Interest 0 2 1 1  0  Down, Depressed, Hopeless 0 3 1 1  0  PHQ - 2 Score 0 5 2 2  0  Altered sleeping 0 3 2 1 3   Tired, decreased energy 3 3 3 3 3   Change in appetite 0 3 0 0 1  Feeling bad or failure about yourself  0 0 0 0 0  Trouble concentrating 1 2 2 2  0  Moving slowly or fidgety/restless 0 0 0 1 0  Suicidal thoughts 0 0 0 0 0  PHQ-9 Score 4 16 9 9 7   Difficult doing work/chores Somewhat difficult Very difficult Somewhat difficult Somewhat difficult Not difficult at all   GAD 7 : Generalized Anxiety Score 03/22/2020 01/26/2019 08/10/2018 07/13/2018  Nervous, Anxious, on Edge 0 2 2 2   Control/stop worrying 0 2 2 1   Worry too much - different things 0 2 2 1   Trouble relaxing 0 2 2 1   Restless 3 1 0 1  Easily annoyed or irritable 0 2 1 1   Afraid - awful might happen 0 1 2 3   Total GAD 7 Score 3 12 11 10   Anxiety Difficulty Somewhat difficult Not difficult at all - Somewhat difficult     Objective:    General: Well Developed, well nourished, and in no acute distress.  Neuro: Alert and oriented x3.  HEENT: Normocephalic, atraumatic.  Skin: Warm and dry. Cardiac: Regular rate and rhythm, no murmurs  rubs or gallops, no lower extremity edema.  Respiratory: Clear to auscultation bilaterally. Not using accessory muscles, speaking in full sentences.   Impression and Recommendations:    1. Encounter to establish care Reviewed available medical records and discussed history with patient.  She is up-to-date on most of her preventative care although she is due for mammogram.  Last labs checked in December 2020.  2.  Generalized anxiety disorder/caregiver stress/depression  Continue Cymbalta 40 mg daily as prescribed.  Refills provided. - DULoxetine (CYMBALTA) 20 MG capsule; Take 2 capsules (40 mg total) by mouth daily.  Dispense: 180 capsule; Refill: 1  3. Inattention Completed an adult ADHD screening tool found on up-to-date.  All 6 of the screening questions fill in the shaded area indicating that her symptoms may be consistent with ADHD.  Referring to psychology for formal testing.  Patient agreeable to the plan to wait for a formal diagnosis before initiating any treatment. - Ambulatory referral to Psychology  6. Motion sickness, initial encounter Scopolamine patch every 3 days as needed for motion sickness provided.  Discussed appropriate use with patient.  7. Encounter for screening mammogram for malignant neoplasm of breast Mammogram ordered. - MM 3D SCREEN BREAST BILATERAL; Future  8. Encounter for hepatitis C screening test for low risk patient Discussed screening recommendations.  Patient agreeable so we will add this to her next lab draw. - Hepatitis C antibody  9. Encounter for screening for HIV Discussed screening recommendations.  Patient agreeable so we will add this to her next blood draw as well. - HIV Antibody (routine testing w rflx)  Return in about 6 months (around 09/21/2020) for mood follow up. ___________________________________________ Thayer Ohm, DNP, APRN, FNP-BC Primary Care and Sports Medicine [facility]

## 2020-07-31 ENCOUNTER — Encounter: Payer: 59 | Admitting: Medical-Surgical

## 2020-07-31 NOTE — Progress Notes (Signed)
Erroneous encounter

## 2020-08-14 ENCOUNTER — Other Ambulatory Visit: Payer: Self-pay

## 2020-08-14 ENCOUNTER — Encounter: Payer: Self-pay | Admitting: Medical-Surgical

## 2020-08-14 ENCOUNTER — Ambulatory Visit (INDEPENDENT_AMBULATORY_CARE_PROVIDER_SITE_OTHER): Payer: 59 | Admitting: Medical-Surgical

## 2020-08-14 VITALS — BP 148/84 | HR 83 | Temp 98.3°F | Ht 64.25 in | Wt 229.0 lb

## 2020-08-14 DIAGNOSIS — Z23 Encounter for immunization: Secondary | ICD-10-CM

## 2020-08-14 DIAGNOSIS — F331 Major depressive disorder, recurrent, moderate: Secondary | ICD-10-CM

## 2020-08-14 MED ORDER — DULOXETINE HCL 60 MG PO CPEP: 60.0000 mg | ORAL_CAPSULE | Freq: Every day | ORAL | 1 refills | Status: DC

## 2020-08-14 NOTE — Progress Notes (Signed)
Subjective:    CC: mood follow up  HPI: Pleasant 54 year old female presenting for depression follow up. Has been taking Cymbalta 20mg  BID, tolerating well without side effects. Feels that the medication helps a little but could help more. Notes she has had increased job stress lately. Her dad is on a waiting list for a retirement center and her mom is showing some signs of dementia. She is the primary caregiver for both of them. She did do some counseling with a therapist through her EAP which was helpful but they only allowed 5 visits. She felt a good connection with that counselor. Is not sleeping well. Has difficulty going to sleep and wakes at night, sometimes 2 or 3 times. Once awake, has difficulty falling back to sleep due to her mind racing and worrying. She does not remember trying anything prescription for sleep but there is a note in the chart that Bellsomra caused paresthesias. Reports being very sensitive to sedating medications. Has tried taking 1 tablet of Advil PM and this leaves her groggy the next morning. Has not tried any melatonin or herbal remedies for sleep. Denies SI/HI.  I reviewed the past medical history, family history, social history, surgical history, and allergies today and no changes were needed.  Please see the problem list section below in epic for further details.  Past Medical History: Past Medical History:  Diagnosis Date  . Anemia   . Chronic headache   . Depression   . Depression   . 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 History of gastric bypass 04/25/2019  . Hypertension   . 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 Microcytic anemia 08/11/2017  . Obesity   . Secondary hyperparathyroidism, non-renal (HCC) 03/13/2019  . Vitamin A deficiency 03/13/2019   Past Surgical History: Past Surgical History:  Procedure Laterality Date  . ABLATION    . GASTRIC BYPASS    . SHOULDER ARTHROSCOPY    . TUMOR REMOVAL     right hand    Social History: Social History   Socioeconomic History  . Marital status: Married    Spouse name: Not on file  . Number of children: 2  . Years of education: Not on file  . Highest education level: Not on file  Occupational History  . Occupation: Human resources  Tobacco Use  . Smoking status: Never Smoker  . Smokeless tobacco: Never Used  Vaping Use  . Vaping Use: Never used  Substance and Sexual Activity  . Alcohol use: No  . Drug use: No  . Sexual activity: Yes    Birth control/protection: Surgical    Comment: vasectomy  Other Topics Concern  . Not on file  Social History Narrative  . Not on file   Social Determinants of Health   Financial Resource Strain:   . Difficulty of Paying Living Expenses: Not on file  Food Insecurity:   . Worried About 03/15/2019 in the Last Year: Not on file  . Ran Out of Food in the Last Year: Not on file  Transportation Needs:   . Lack of Transportation (Medical): Not on file  . Lack of Transportation (Non-Medical): Not on file  Physical Activity:   . Days of Exercise per Week: Not on file  . Minutes of Exercise per Session: Not on file  Stress:   . Feeling of Stress : Not on file  Social Connections:   . Frequency of Communication with Friends and Family: Not  on file  . Frequency of Social Gatherings with Friends and Family: Not on file  . Attends Religious Services: Not on file  . Active Member of Clubs or Organizations: Not on file  . Attends Banker Meetings: Not on file  . Marital Status: Not on file   Family History: Family History  Problem Relation Age of Onset  . Hypertension Mother   . Depression Mother   . Cancer Other        skin  . Hypertension Father   . Dementia Father   . Bipolar disorder Sister   . Melanoma Maternal Grandmother   . Colon cancer Neg Hx   . Esophageal cancer Neg Hx    Allergies: Allergies  Allergen Reactions  . Belsomra [Suvorexant] Other (See Comments)     paresthesias  . Codeine Nausea Only  . Penicillins Rash   Medications: See med rec.  Review of Systems: See HPI for pertinent positives and negatives.   Depression screen [facility] 2/9 08/14/2020 03/22/2020 01/26/2019 08/10/2018 07/13/2018  Decreased Interest 1 0 2 1 1   Down, Depressed, Hopeless 2 0 3 1 1   PHQ - 2 Score 3 0 5 2 2   Altered sleeping 2 0 3 2 1   Tired, decreased energy 3 3 3 3 3   Change in appetite 1 0 3 0 0  Feeling bad or failure about yourself  0 0 0 0 0  Trouble concentrating 3 1 2 2 2   Moving slowly or fidgety/restless 1 0 0 0 1  Suicidal thoughts 0 0 0 0 0  PHQ-9 Score 13 4 16 9 9   Difficult doing work/chores Somewhat difficult Somewhat difficult Very difficult Somewhat difficult Somewhat difficult   GAD 7 : Generalized Anxiety Score 08/14/2020 03/22/2020 01/26/2019 08/10/2018  Nervous, Anxious, on Edge 2 0 2 2  Control/stop worrying 2 0 2 2  Worry too much - different things 2 0 2 2  Trouble relaxing 2 0 2 2  Restless 3 3 1  0  Easily annoyed or irritable 1 0 2 1  Afraid - awful might happen 1 0 1 2  Total GAD 7 Score 13 3 12 11   Anxiety Difficulty Somewhat difficult Somewhat difficult Not difficult at all -    Objective:    General: Well Developed, well nourished, and in no acute distress.  Neuro: Alert and oriented x3.  HEENT: Normocephalic, atraumatic.  Skin: Warm and dry. Cardiac: Regular rate and rhythm, no murmurs rubs or gallops, no lower extremity edema.  Respiratory: Clear to auscultation bilaterally. Not using accessory muscles, speaking in full sentences.   Impression and Recommendations:    1. Moderate episode of recurrent major depressive disorder (HCC) Increase Cymbalta to 60mg  nightly, recommend taking 2 of the 20mg  capsules at night to transition to 60mg  dose at night and decrease the risk of side effects/intolerance of dose. Recommend connecting with her counselor to see if she accepts her insurance.  - DULoxetine (CYMBALTA) 60 MG capsule;  Take 1 capsule (60 mg total) by mouth daily.  Dispense: 30 capsule; Refill: 1  2. Need for shingles vaccine Shingles vaccine given in office. Return for nurse visit for 2nd dose in 2-6 months.  - Varicella-zoster vaccine IM  Return in about 4 weeks (around 09/11/2020) for mood follow up (ok to be virtual). ___________________________________________ , DNP, APRN, FNP-BC Primary Care and Sports Medicine [facility]'

## 2020-08-15 ENCOUNTER — Ambulatory Visit (INDEPENDENT_AMBULATORY_CARE_PROVIDER_SITE_OTHER): Payer: Self-pay | Admitting: Psychology

## 2020-08-15 DIAGNOSIS — F411 Generalized anxiety disorder: Secondary | ICD-10-CM

## 2020-08-28 ENCOUNTER — Encounter: Payer: Self-pay | Admitting: Medical-Surgical

## 2020-08-28 DIAGNOSIS — R5383 Other fatigue: Secondary | ICD-10-CM

## 2020-08-28 DIAGNOSIS — D508 Other iron deficiency anemias: Secondary | ICD-10-CM

## 2020-08-30 LAB — CBC WITH DIFFERENTIAL/PLATELET
Absolute Monocytes: 644 cells/uL (ref 200–950)
Basophils Absolute: 50 cells/uL (ref 0–200)
Basophils Relative: 0.5 %
Eosinophils Absolute: 119 cells/uL (ref 15–500)
Eosinophils Relative: 1.2 %
HCT: 40.3 % (ref 35.0–45.0)
Hemoglobin: 13.2 g/dL (ref 11.7–15.5)
Lymphs Abs: 2683 cells/uL (ref 850–3900)
MCH: 28.3 pg (ref 27.0–33.0)
MCHC: 32.8 g/dL (ref 32.0–36.0)
MCV: 86.3 fL (ref 80.0–100.0)
MPV: 10.4 fL (ref 7.5–12.5)
Monocytes Relative: 6.5 %
Neutro Abs: 6405 cells/uL (ref 1500–7800)
Neutrophils Relative %: 64.7 %
Platelets: 372 10*3/uL (ref 140–400)
RBC: 4.67 10*6/uL (ref 3.80–5.10)
RDW: 13.7 % (ref 11.0–15.0)
Total Lymphocyte: 27.1 %
WBC: 9.9 10*3/uL (ref 3.8–10.8)

## 2020-08-30 LAB — FERRITIN: Ferritin: 83 ng/mL (ref 16–232)

## 2020-08-30 LAB — COMPLETE METABOLIC PANEL WITH GFR
AG Ratio: 1.5 (calc) (ref 1.0–2.5)
ALT: 21 U/L (ref 6–29)
AST: 18 U/L (ref 10–35)
Albumin: 3.9 g/dL (ref 3.6–5.1)
Alkaline phosphatase (APISO): 86 U/L (ref 37–153)
BUN: 22 mg/dL (ref 7–25)
CO2: 27 mmol/L (ref 20–32)
Calcium: 9 mg/dL (ref 8.6–10.4)
Chloride: 105 mmol/L (ref 98–110)
Creat: 0.61 mg/dL (ref 0.50–1.05)
GFR, Est African American: 121 mL/min/{1.73_m2} (ref >=60)
GFR, Est Non African American: 104 mL/min/{1.73_m2} (ref >=60)
Globulin: 2.6 g/dL (calc) (ref 1.9–3.7)
Glucose, Bld: 88 mg/dL (ref 65–99)
Potassium: 4 mmol/L (ref 3.5–5.3)
Sodium: 140 mmol/L (ref 135–146)
Total Bilirubin: 0.2 mg/dL (ref 0.2–1.2)
Total Protein: 6.5 g/dL (ref 6.1–8.1)

## 2020-08-30 LAB — IRON, TOTAL/TOTAL IRON BINDING CAP
%SAT: 12 % (calc) — ABNORMAL LOW (ref 16–45)
Iron: 38 ug/dL — ABNORMAL LOW (ref 45–160)
TIBC: 307 mcg/dL (calc) (ref 250–450)

## 2020-08-30 LAB — RETICULOCYTES
ABS Retic: 46700 cells/uL (ref 20000–8000)
Retic Ct Pct: 1 %

## 2020-09-06 ENCOUNTER — Other Ambulatory Visit: Payer: Self-pay | Admitting: Medical-Surgical

## 2020-09-06 DIAGNOSIS — F331 Major depressive disorder, recurrent, moderate: Secondary | ICD-10-CM

## 2020-09-15 ENCOUNTER — Other Ambulatory Visit: Payer: Self-pay | Admitting: Medical-Surgical

## 2020-09-15 DIAGNOSIS — F331 Major depressive disorder, recurrent, moderate: Secondary | ICD-10-CM

## 2020-09-15 DIAGNOSIS — F410 Panic disorder [episodic paroxysmal anxiety] without agoraphobia: Secondary | ICD-10-CM

## 2020-09-15 DIAGNOSIS — F411 Generalized anxiety disorder: Secondary | ICD-10-CM

## 2020-09-15 DIAGNOSIS — Z636 Dependent relative needing care at home: Secondary | ICD-10-CM

## 2020-09-20 ENCOUNTER — Encounter: Payer: Self-pay | Admitting: Medical-Surgical

## 2020-09-20 ENCOUNTER — Telehealth (INDEPENDENT_AMBULATORY_CARE_PROVIDER_SITE_OTHER): Payer: 59 | Admitting: Medical-Surgical

## 2020-09-20 DIAGNOSIS — F331 Major depressive disorder, recurrent, moderate: Secondary | ICD-10-CM

## 2020-09-20 DIAGNOSIS — Z636 Dependent relative needing care at home: Secondary | ICD-10-CM | POA: Diagnosis not present

## 2020-09-20 DIAGNOSIS — F410 Panic disorder [episodic paroxysmal anxiety] without agoraphobia: Secondary | ICD-10-CM

## 2020-09-20 DIAGNOSIS — Z1231 Encounter for screening mammogram for malignant neoplasm of breast: Secondary | ICD-10-CM | POA: Diagnosis not present

## 2020-09-20 DIAGNOSIS — F411 Generalized anxiety disorder: Secondary | ICD-10-CM

## 2020-09-20 MED ORDER — DULOXETINE HCL 60 MG PO CPEP: 60.0000 mg | ORAL_CAPSULE | Freq: Every day | ORAL | 1 refills | Status: DC

## 2020-09-20 NOTE — Progress Notes (Signed)
Virtual Visit via Video Note  I connected with Meagan Floyd on 09/20/20 at  8:30 AM EST by a video enabled telemedicine application and verified that I am speaking with the correct person using two identifiers.   I discussed the limitations of evaluation and management by telemedicine and the availability of in person appointments. The patient expressed understanding and agreed to proceed.  Patient location: home Provider locations: office  Subjective:    CC: mood follow up  HPI: Pleasant 54 year old female presenting via MyChart video visit for mood follow up. Taking Cymbalta 60mg  daily, tolerating well without side effects. Notes that the increased dose has helped very well and her symptoms are well managed. Has been working to get her dad into a retirement home. He has toured several and will likely go into one early in the new year. Denies SI/HI.   Due for mammogram. Last was normal about 2-3 years ago. Would like to get that scheduled if possible.   Past medical history, Surgical history, Family history not pertinant except as noted below, Social history, Allergies, and medications have been entered into the medical record, reviewed, and corrections made.   Review of Systems: See HPI for pertinent positives and negatives.   Depression screen [facility] 2/9 09/20/2020 08/14/2020 03/22/2020 01/26/2019 08/10/2018  Decreased Interest 1 1 0 2 1  Down, Depressed, Hopeless 1 2 0 3 1  PHQ - 2 Score 2 3 0 5 2  Altered sleeping 1 2 0 3 2  Tired, decreased energy 1 3 3 3 3   Change in appetite 0 1 0 3 0  Feeling bad or failure about yourself  0 0 0 0 0  Trouble concentrating 1 3 1 2 2   Moving slowly or fidgety/restless 0 1 0 0 0  Suicidal thoughts 0 0 0 0 0  PHQ-9 Score 5 13 4 16 9   Difficult doing work/chores Not difficult at all Somewhat difficult Somewhat difficult Very difficult Somewhat difficult   GAD 7 : Generalized Anxiety Score 09/20/2020 08/14/2020 03/22/2020 01/26/2019  Nervous,  Anxious, on Edge 1 2 0 2  Control/stop worrying 1 2 0 2  Worry too much - different things 1 2 0 2  Trouble relaxing 1 2 0 2  Restless 2 3 3 1   Easily annoyed or irritable 1 1 0 2  Afraid - awful might happen 1 1 0 1  Total GAD 7 Score 8 13 3 12   Anxiety Difficulty Not difficult at all Somewhat difficult Somewhat difficult Not difficult at all   Objective:    General: Speaking clearly in complete sentences without any shortness of breath.  Alert and oriented x3.  Normal judgment. No apparent acute distress.  Impression and Recommendations:    1. Moderate episode of recurrent major depressive disorder (HCC) 2. Caregiver stress 3. Generalized anxiety disorder with panic attacks Symptoms stable on current 60mg  daily dose of Cymbalta. Continue current prescription. Refills sent.   4. Breast cancer screening Mammogram ordered.   I discussed the assessment and treatment plan with the patient. The patient was provided an opportunity to ask questions and all were answered. The patient agreed with the plan and demonstrated an understanding of the instructions.   The patient was advised to call back or seek an in-person evaluation if the symptoms worsen or if the condition fails to improve as anticipated.  20 minutes of non-face-to-face time was provided during this encounter.  Return in about 6 months (around 03/21/2021) for mood follow up.  Cardin Nitschke L.  Larinda Buttery, DNP, APRN, FNP-BC [facility] and Sports Medicine

## 2020-09-20 NOTE — Addendum Note (Signed)
Addended byChristen Butter on: 09/20/2020 04:27 PM   Modules accepted: Orders

## 2020-10-24 ENCOUNTER — Ambulatory Visit: Payer: 59 | Admitting: Psychology

## 2020-11-15 ENCOUNTER — Ambulatory Visit: Payer: 59

## 2020-11-22 ENCOUNTER — Ambulatory Visit: Payer: 59

## 2020-12-05 ENCOUNTER — Ambulatory Visit: Payer: 59

## 2020-12-18 ENCOUNTER — Ambulatory Visit: Payer: 59

## 2020-12-19 ENCOUNTER — Other Ambulatory Visit: Payer: Self-pay

## 2020-12-19 ENCOUNTER — Ambulatory Visit (INDEPENDENT_AMBULATORY_CARE_PROVIDER_SITE_OTHER): Payer: 59

## 2020-12-19 DIAGNOSIS — Z1231 Encounter for screening mammogram for malignant neoplasm of breast: Secondary | ICD-10-CM

## 2020-12-20 ENCOUNTER — Other Ambulatory Visit: Payer: Self-pay | Admitting: Medical-Surgical

## 2020-12-20 DIAGNOSIS — R928 Other abnormal and inconclusive findings on diagnostic imaging of breast: Secondary | ICD-10-CM

## 2020-12-21 ENCOUNTER — Other Ambulatory Visit: Payer: Self-pay | Admitting: Medical-Surgical

## 2020-12-21 DIAGNOSIS — R928 Other abnormal and inconclusive findings on diagnostic imaging of breast: Secondary | ICD-10-CM

## 2020-12-24 ENCOUNTER — Other Ambulatory Visit: Payer: Self-pay

## 2020-12-24 ENCOUNTER — Emergency Department (INDEPENDENT_AMBULATORY_CARE_PROVIDER_SITE_OTHER)
Admission: RE | Admit: 2020-12-24 | Discharge: 2020-12-24 | Disposition: A | Discharge status: Home / Self Care | Payer: 59 | Source: Ambulatory Visit

## 2020-12-24 ENCOUNTER — Emergency Department (INDEPENDENT_AMBULATORY_CARE_PROVIDER_SITE_OTHER): Payer: 59

## 2020-12-24 VITALS — BP 147/94 | HR 89 | Temp 97.1°F | Resp 18 | Ht 64.0 in | Wt 220.0 lb

## 2020-12-24 DIAGNOSIS — S93401A Sprain of unspecified ligament of right ankle, initial encounter: Secondary | ICD-10-CM

## 2020-12-24 DIAGNOSIS — M25571 Pain in right ankle and joints of right foot: Secondary | ICD-10-CM

## 2020-12-24 NOTE — ED Provider Notes (Signed)
[facility]   [id number] 12/24/20 Arrival Time: 1558  [id number] PAIN  SUBJECTIVE: History from: patient. Meagan Floyd is a 55 y.o. female complains of R ankle pain that began about 2 months ago. Denies a precipitating event or specific injury. Localizes the pain to the lateral aspect of the R ankle. Describes the pain as intermittent and achy in character with intermittent sharp pain. Reports intermittent inability to bear weight. Has tried OTC medications with temporary relief. Symptoms are made worse with activity. Denies similar symptoms in the past. Has been wearing home brace when active. Denies fever, chills, erythema, ecchymosis, effusion, weakness, numbness and tingling, saddle paresthesias, loss of bowel or bladder function.      ROS: As per HPI.  All other pertinent ROS negative.     Past Medical History:  Diagnosis Date  . Anemia   . Chronic headache   . Depression   . Depression   . 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 History of gastric bypass 04/25/2019  . Hypertension   . 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 Microcytic anemia 08/11/2017  . Obesity   . Secondary hyperparathyroidism, non-renal (HCC) 03/13/2019  . Vitamin A deficiency 03/13/2019   Past Surgical History:  Procedure Laterality Date  . ABLATION    . GASTRIC BYPASS    . SHOULDER ARTHROSCOPY    . TUMOR REMOVAL     right hand   Allergies  Allergen Reactions  . Belsomra [Suvorexant] Other (See Comments)    paresthesias  . Codeine Nausea Only  . Penicillins Rash   No current facility-administered medications on file prior to encounter.   Current Outpatient Medications on File Prior to Encounter  Medication Sig Dispense Refill  . DULoxetine (CYMBALTA) 60 MG capsule Take 1 capsule (60 mg total) by mouth daily. 90 capsule 1   Social History   Socioeconomic History  . Marital status: Married    Spouse name: Not on file  . Number of children: 2  . Years  of education: Not on file  . Highest education level: Not on file  Occupational History  . Occupation: Human resources  Tobacco Use  . Smoking status: Never Smoker  . Smokeless tobacco: Never Used  Vaping Use  . Vaping Use: Never used  Substance and Sexual Activity  . Alcohol use: No  . Drug use: No  . Sexual activity: Yes    Birth control/protection: Surgical    Comment: vasectomy  Other Topics Concern  . Not on file  Social History Narrative  . Not on file   Social Determinants of Health   Financial Resource Strain: Not on file  Food Insecurity: Not on file  Transportation Needs: Not on file  Physical Activity: Not on file  Stress: Not on file  Social Connections: Not on file  Intimate Partner Violence: Not on file   Family History  Problem Relation Age of Onset  . Hypertension Mother   . Depression Mother   . Cancer Other        skin  . Hypertension Father   . Dementia Father   . Bipolar disorder Sister   . Melanoma Maternal Grandmother   . Colon cancer Neg Hx   . Esophageal cancer Neg Hx     OBJECTIVE:  Vitals:   12/24/20 1620 12/24/20 1621  BP: (!) 147/94   Pulse: 89   Resp: 18   Temp: (!) 97.1 F (36.2 C)   TempSrc: Oral  SpO2: 97%   Weight:  220 lb (99.8 kg)  Height:  5\' 4"  (1.626 m)    General appearance: ALERT; in no acute distress.  Head: NCAT Lungs: Normal respiratory effort CV: pulses 2+ bilaterally. Cap refill < 2 seconds Musculoskeletal:  Inspection: Skin warm, dry, clear and intact No erythema, effusion noted Palpation: lateral malleolus of R ankle tender to palpation ROM: Limited ROM active and passive to R ankle Skin: warm and dry Neurologic: Ambulates without difficulty; Sensation intact about the upper/ lower extremities Psychological: alert and cooperative; normal mood and affect  DIAGNOSTIC STUDIES:  DG Ankle Complete Right  Result Date: 12/24/2020 CLINICAL DATA:  pain, inability to bear weight EXAM: RIGHT ANKLE -  COMPLETE 3+ VIEW COMPARISON:  None. FINDINGS: No evidence of fracture, dislocation, or joint effusion. No evidence of severe arthropathy. No aggressive appearing focal bone abnormality. Mild subcutaneus soft tissue edema of the lateral right ankle. IMPRESSION: No acute displaced fracture or dislocation. Electronically Signed   By: 12/26/2020 M.D.   On: 12/24/2020 17:12     ASSESSMENT & PLAN:  1. Acute right ankle pain   2. Sprain of right ankle, unspecified ligament, initial encounter     Xray today negative for any fracture or misalignment ASO brace placed to R ankle Wear this when active and for comfort Continue conservative management of rest, ice, and gentle stretches Take ibuprofen as needed for pain relief (may cause abdominal discomfort, ulcers, and GI bleeds avoid taking with other NSAIDs)  Follow up with sports medicine if symptoms persist Return or go to the ER if you have any new or worsening symptoms (fever, chills, chest pain, abdominal pain, changes in bowel or bladder habits, pain radiating into lower legs)   Reviewed expectations re: course of current medical issues. Questions answered. Outlined signs and symptoms indicating need for more acute intervention. Patient verbalized understanding. After Visit Summary given.       12/26/2020, NP 12/24/20 1720

## 2020-12-24 NOTE — ED Triage Notes (Signed)
Rt ankle pain intermittently x 2 months worse 2 days ago.

## 2020-12-24 NOTE — Discharge Instructions (Addendum)
Xray today negative for any fracture or misalignment  We have placed you in a more supportive brace to wear when active  May use ice, elevation, ibuprofen for pain as needed  Follow up with sports medicine if symptoms are persisting

## 2021-01-09 ENCOUNTER — Ambulatory Visit
Admission: RE | Admit: 2021-01-09 | Discharge: 2021-01-09 | Disposition: A | Discharge status: Home / Self Care | Payer: 59 | Source: Ambulatory Visit | Attending: Medical-Surgical | Admitting: Medical-Surgical

## 2021-01-09 ENCOUNTER — Ambulatory Visit: Payer: 59

## 2021-01-09 ENCOUNTER — Other Ambulatory Visit: Payer: Self-pay

## 2021-01-09 DIAGNOSIS — R928 Other abnormal and inconclusive findings on diagnostic imaging of breast: Secondary | ICD-10-CM

## 2021-03-19 ENCOUNTER — Other Ambulatory Visit: Payer: Self-pay | Admitting: Medical-Surgical

## 2021-03-19 DIAGNOSIS — F331 Major depressive disorder, recurrent, moderate: Secondary | ICD-10-CM

## 2021-04-17 ENCOUNTER — Other Ambulatory Visit: Payer: Self-pay | Admitting: Medical-Surgical

## 2021-04-17 DIAGNOSIS — F331 Major depressive disorder, recurrent, moderate: Secondary | ICD-10-CM

## 2021-04-26 ENCOUNTER — Telehealth: Payer: Self-pay

## 2021-04-26 ENCOUNTER — Other Ambulatory Visit: Payer: Self-pay | Admitting: Medical-Surgical

## 2021-04-26 DIAGNOSIS — F331 Major depressive disorder, recurrent, moderate: Secondary | ICD-10-CM

## 2021-04-26 NOTE — Telephone Encounter (Signed)
Left pt VM regarding need for follow up visit in order to get refills on Cymbalta.

## 2021-05-15 ENCOUNTER — Telehealth (INDEPENDENT_AMBULATORY_CARE_PROVIDER_SITE_OTHER): Payer: 59 | Admitting: Medical-Surgical

## 2021-05-15 ENCOUNTER — Encounter: Payer: Self-pay | Admitting: Medical-Surgical

## 2021-05-15 DIAGNOSIS — F331 Major depressive disorder, recurrent, moderate: Secondary | ICD-10-CM | POA: Diagnosis not present

## 2021-05-15 DIAGNOSIS — Z636 Dependent relative needing care at home: Secondary | ICD-10-CM

## 2021-05-15 DIAGNOSIS — F411 Generalized anxiety disorder: Secondary | ICD-10-CM

## 2021-05-15 DIAGNOSIS — F410 Panic disorder [episodic paroxysmal anxiety] without agoraphobia: Secondary | ICD-10-CM

## 2021-05-15 DIAGNOSIS — F5105 Insomnia due to other mental disorder: Secondary | ICD-10-CM | POA: Diagnosis not present

## 2021-05-15 DIAGNOSIS — F99 Mental disorder, not otherwise specified: Secondary | ICD-10-CM

## 2021-05-15 MED ORDER — DULOXETINE HCL 60 MG PO CPEP: 60.0000 mg | ORAL_CAPSULE | Freq: Every day | ORAL | 1 refills | Status: DC

## 2021-05-15 NOTE — Progress Notes (Signed)
Virtual Visit via Video Note  I connected with Meagan Floyd on 05/15/21 at  3:40 PM EDT by a video enabled telemedicine application and verified that I am speaking with the correct person using two identifiers.   I discussed the limitations of evaluation and management by telemedicine and the availability of in person appointments. The patient expressed understanding and agreed to proceed.  Patient location: home Provider locations: office  Subjective:    CC: Mood follow-up  HPI: Very pleasant 55 year old female presenting today via MyChart video visit for mood follow-up.  She is currently taking Cymbalta 60 mg daily as prescribed, tolerating well without side effects.  Feels the medication is working very well for her and keeping her mood on an even keel.  She is still having difficulty sleeping at night but this is more related to waking with night sweats.  Once she does wake with night sweats, she has difficulty falling back to sleep and is often awake for 2 to 3 hours.  Notes that she is waking like this approximately 5 nights per week.  Denies SI/HI.  Past medical history, Surgical history, Family history not pertinant except as noted below, Social history, Allergies, and medications have been entered into the medical record, reviewed, and corrections made.   Review of Systems: See HPI for pertinent positives and negatives.   Depression screen [facility] 2/9 05/15/2021 09/20/2020 08/14/2020 03/22/2020 01/26/2019  Decreased Interest 0 1 1 0 2  Down, Depressed, Hopeless 0 1 2 0 3  PHQ - 2 Score 0 2 3 0 5  Altered sleeping 3 1 2  0 3  Tired, decreased energy 2 1 3 3 3   Change in appetite 1 0 1 0 3  Feeling bad or failure about yourself  0 0 0 0 0  Trouble concentrating 0 1 3 1 2   Moving slowly or fidgety/restless 0 0 1 0 0  Suicidal thoughts 0 0 0 0 0  PHQ-9 Score 6 5 13 4 16   Difficult doing work/chores Not difficult at all Not difficult at all Somewhat difficult Somewhat difficult Very  difficult  Some recent data might be hidden   GAD 7 : Generalized Anxiety Score 05/15/2021 09/20/2020 08/14/2020 03/22/2020  Nervous, Anxious, on Edge 0 1 2 0  Control/stop worrying 0 1 2 0  Worry too much - different things 0 1 2 0  Trouble relaxing 1 1 2  0  Restless 0 2 3 3   Easily annoyed or irritable 0 1 1 0  Afraid - awful might happen 0 1 1 0  Total GAD 7 Score 1 8 13 3   Anxiety Difficulty Not difficult at all Not difficult at all Somewhat difficult Somewhat difficult     Objective:    General: Speaking clearly in complete sentences without any shortness of breath.  Alert and oriented x3.  Normal judgment. No apparent acute distress.  Impression and Recommendations:    1. Generalized anxiety disorder with panic attacks 2. Moderate episode of recurrent major depressive disorder (HCC) 3. Caregiver stress 4. Insomnia due to other mental disorder Doing very well on Cymbalta at 60 mg daily.  Continue medication as prescribed.  Refill sent in for 63-month supply with plan to follow-up in 6 months unless she needs me sooner.  I discussed the assessment and treatment plan with the patient. The patient was provided an opportunity to ask questions and all were answered. The patient agreed with the plan and demonstrated an understanding of the instructions.   The patient was  advised to call back or seek an in-person evaluation if the symptoms worsen or if the condition fails to improve as anticipated.  20 minutes of non-face-to-face time was provided during this encounter.  Return in about 6 months (around 11/15/2021) for mood follow up.  Thayer Ohm, DNP, APRN, FNP-BC [facility] and Sports Medicine

## 2021-06-10 ENCOUNTER — Encounter: Payer: 59 | Admitting: Medical-Surgical

## 2021-06-10 NOTE — Progress Notes (Signed)
Late cancellation. Erroneous encounter. Please disregard.

## 2021-06-19 ENCOUNTER — Ambulatory Visit (INDEPENDENT_AMBULATORY_CARE_PROVIDER_SITE_OTHER): Payer: 59 | Admitting: Medical-Surgical

## 2021-06-19 ENCOUNTER — Encounter: Payer: Self-pay | Admitting: Medical-Surgical

## 2021-06-19 ENCOUNTER — Other Ambulatory Visit: Payer: Self-pay

## 2021-06-19 ENCOUNTER — Other Ambulatory Visit (HOSPITAL_COMMUNITY)
Admission: RE | Admit: 2021-06-19 | Discharge: 2021-06-19 | Disposition: A | Discharge status: Home / Self Care | Payer: 59 | Source: Ambulatory Visit | Attending: Medical-Surgical | Admitting: Medical-Surgical

## 2021-06-19 VITALS — BP 126/85 | HR 92 | Resp 20 | Ht 64.0 in | Wt 225.2 lb

## 2021-06-19 DIAGNOSIS — Z23 Encounter for immunization: Secondary | ICD-10-CM

## 2021-06-19 DIAGNOSIS — Z124 Encounter for screening for malignant neoplasm of cervix: Secondary | ICD-10-CM | POA: Diagnosis not present

## 2021-06-19 NOTE — Progress Notes (Signed)
  HPI with pertinent ROS:   CC: Pap smear, flu shot  HPI: Pleasant 55 year old female presenting today for completion of her Pap smear.  She is currently sexually active with her husband in a monogamous relationship.  They do not use condoms as he has had a vasectomy and she has had an ablation.  No concerning vaginal symptoms today and no concern for STIs or desire for testing.  Last Pap smear completed in 2017, normal results with negative HPV.  I reviewed the past medical history, family history, social history, surgical history, and allergies today and no changes were needed.  Please see the problem list section below in epic for further details.   Physical exam:   General: Well Developed, well nourished, and in no acute distress.  Neuro: Alert and oriented x3,.  HEENT: Normocephalic, atraumatic.  Skin: Warm and dry. Cardiac: Regular rate and rhythm, no murmurs rubs or gallops, no lower extremity edema.  Respiratory: Clear to auscultation bilaterally. Not using accessory muscles, speaking in full sentences. Pelvic exam: normal external genitalia, vulva, vagina, cervix, uterus and adnexa, PAP: Pap smear done today, HPV test, exam chaperoned by Gonzella Lex, MA.   Impression and Recommendations:    1. Cervical cancer screening Pap smear with HPV cotesting completed today, patient tolerated well.  If normal, will be due again in 5 years. - Cytology - PAP  2. Need for influenza vaccination Flu vaccine given in office today. - Flu Vaccine QUAD 62mo+IM (Fluarix, Fluzone & Alfiuria Quad PF)  Return in about 6 months (around 12/17/2021) for chronic disease follow up. ___________________________________________ Thayer Ohm, DNP, APRN, FNP-BC Primary Care and Sports Medicine [facility]

## 2021-06-20 LAB — CYTOLOGY - PAP
Comment: NEGATIVE
Diagnosis: NEGATIVE
High risk HPV: NEGATIVE

## 2021-08-12 ENCOUNTER — Other Ambulatory Visit: Payer: Self-pay | Admitting: Medical-Surgical

## 2021-08-12 ENCOUNTER — Encounter: Payer: Self-pay | Admitting: Medical-Surgical

## 2021-08-12 MED ORDER — SCOPOLAMINE 1 MG/3DAYS TD PT72
1.0000 | MEDICATED_PATCH | TRANSDERMAL | 3 refills | Status: DC
Start: 2021-08-12 — End: 2022-04-17

## 2021-11-10 ENCOUNTER — Other Ambulatory Visit: Payer: Self-pay | Admitting: Medical-Surgical

## 2021-11-25 ENCOUNTER — Other Ambulatory Visit: Payer: Self-pay

## 2021-11-25 ENCOUNTER — Encounter: Payer: Self-pay | Admitting: Medical-Surgical

## 2021-11-25 ENCOUNTER — Ambulatory Visit (INDEPENDENT_AMBULATORY_CARE_PROVIDER_SITE_OTHER): Payer: 59

## 2021-11-25 ENCOUNTER — Ambulatory Visit (INDEPENDENT_AMBULATORY_CARE_PROVIDER_SITE_OTHER): Payer: 59 | Admitting: Medical-Surgical

## 2021-11-25 VITALS — BP 141/90 | HR 85 | Resp 20 | Ht 64.0 in | Wt 216.2 lb

## 2021-11-25 DIAGNOSIS — R251 Tremor, unspecified: Secondary | ICD-10-CM | POA: Diagnosis not present

## 2021-11-25 DIAGNOSIS — R110 Nausea: Secondary | ICD-10-CM

## 2021-11-25 DIAGNOSIS — R5383 Other fatigue: Secondary | ICD-10-CM

## 2021-11-25 DIAGNOSIS — R42 Dizziness and giddiness: Secondary | ICD-10-CM

## 2021-11-25 DIAGNOSIS — R002 Palpitations: Secondary | ICD-10-CM

## 2021-11-25 NOTE — Progress Notes (Unsigned)
Enrolled for [employer] to mail a ZIO XT long term holter monitor to the patients address on file.  

## 2021-11-25 NOTE — Progress Notes (Signed)
°  HPI with pertinent ROS:   CC: fatigue, dizziness  HPI: Pleasant 56 year old female presenting today with complaints of severe fatigue that has been getting progressively worse over the past few weeks. About 4-5 days ago, she noted that she was extremely tire but went to work anyway. At one point, she became lightheaded with nausea, palpitations, and hand shaking while sitting at her desk. This only lasted a short period of time before resolving. These same symptoms have occurred daily and do not appear to be associated with any particular activity or time of day. She has been drinking lots of fluids and making healthy dietary choices. Notes that lying down makes the symptoms a bit better but hasn't found any initiating or aggravating factors. No syncopal episodes. Although fatigued, feels that she could do strenuous activity without shortness of breath or chest pain. No recent viral illnesses.   I reviewed the past medical history, family history, social history, surgical history, and allergies today and no changes were needed.  Please see the problem list section below in epic for further details.  Physical exam:   General: Well Developed, well nourished, and in no acute distress.  Neuro: Alert and oriented x3.  HEENT: Normocephalic, atraumatic.  Skin: Warm and dry. Cardiac: Regular rate and rhythm, no murmurs rubs or gallops, no lower extremity edema.  Respiratory: Clear to auscultation bilaterally. Not using accessory muscles, speaking in full sentences.  Impression and Recommendations:    1. Fatigue, unspecified type 2. Palpitations 3. Nausea 4. Shakiness 5. Dizziness Unclear etiology. Consider hypoglycemia, hypotension, or cardiac arrhythmia. No concern for dehydration. Checking labs as below. Adding a long term heart monitor for further evaluation. Monitor for symptoms to occur and try to log any information surrounding the episode, including vital signs, to help pinpoint a potential  cause.  - CBC - COMPLETE METABOLIC PANEL WITH GFR - TSH - Fe+TIBC+Fer - Troponin I - Hemoglobin A1c - LONG TERM MONITOR (3-14 DAYS); Future  Return if symptoms worsen or fail to improve. Further follow up pending results.  ___________________________________________ Clearnce Sorrel, DNP, APRN, FNP-BC Primary Care and Sports Medicine [place]

## 2021-11-26 ENCOUNTER — Encounter: Payer: Self-pay | Admitting: Medical-Surgical

## 2021-11-27 LAB — COMPLETE METABOLIC PANEL WITH GFR
AG Ratio: 1.6 (calc) (ref 1.0–2.5)
ALT: 27 U/L (ref 6–29)
AST: 24 U/L (ref 10–35)
Albumin: 4.1 g/dL (ref 3.6–5.1)
Alkaline phosphatase (APISO): 90 U/L (ref 37–153)
BUN: 19 mg/dL (ref 7–25)
CO2: 24 mmol/L (ref 20–32)
Calcium: 9.3 mg/dL (ref 8.6–10.4)
Chloride: 104 mmol/L (ref 98–110)
Creat: 0.83 mg/dL (ref 0.50–1.03)
Globulin: 2.5 g/dL (calc) (ref 1.9–3.7)
Glucose, Bld: 94 mg/dL (ref 65–99)
Potassium: 4.1 mmol/L (ref 3.5–5.3)
Sodium: 141 mmol/L (ref 135–146)
Total Bilirubin: 0.4 mg/dL (ref 0.2–1.2)
Total Protein: 6.6 g/dL (ref 6.1–8.1)
eGFR: 84 mL/min/{1.73_m2} (ref >=60)

## 2021-11-27 LAB — CBC
HCT: 40.6 % (ref 35.0–45.0)
Hemoglobin: 12.9 g/dL (ref 11.7–15.5)
MCH: 27 pg (ref 27.0–33.0)
MCHC: 31.8 g/dL — ABNORMAL LOW (ref 32.0–36.0)
MCV: 84.9 fL (ref 80.0–100.0)
MPV: 10.5 fL (ref 7.5–12.5)
Platelets: 358 10*3/uL (ref 140–400)
RBC: 4.78 10*6/uL (ref 3.80–5.10)
RDW: 14.7 % (ref 11.0–15.0)
WBC: 9.9 10*3/uL (ref 3.8–10.8)

## 2021-11-27 LAB — TSH: TSH: 1.46 mIU/L

## 2021-11-27 LAB — TROPONIN I: Troponin I: <3 ng/L (ref <=47)

## 2021-11-27 LAB — HEMOGLOBIN A1C
Hgb A1c MFr Bld: 5.5 % of total Hgb (ref <5.7)
Mean Plasma Glucose: 111 mg/dL
eAG (mmol/L): 6.2 mmol/L

## 2021-11-27 LAB — IRON,TIBC AND FERRITIN PANEL
%SAT: 11 % (calc) — ABNORMAL LOW (ref 16–45)
Ferritin: 35 ng/mL (ref 16–232)
Iron: 38 ug/dL — ABNORMAL LOW (ref 45–160)
TIBC: 339 mcg/dL (calc) (ref 250–450)

## 2021-11-30 DIAGNOSIS — R42 Dizziness and giddiness: Secondary | ICD-10-CM

## 2021-11-30 DIAGNOSIS — R5383 Other fatigue: Secondary | ICD-10-CM

## 2021-11-30 DIAGNOSIS — R251 Tremor, unspecified: Secondary | ICD-10-CM

## 2021-11-30 DIAGNOSIS — R002 Palpitations: Secondary | ICD-10-CM

## 2021-11-30 DIAGNOSIS — R110 Nausea: Secondary | ICD-10-CM

## 2021-12-17 ENCOUNTER — Ambulatory Visit: Payer: 59 | Admitting: Medical-Surgical

## 2021-12-24 ENCOUNTER — Ambulatory Visit: Payer: 59 | Admitting: Medical-Surgical

## 2021-12-26 ENCOUNTER — Ambulatory Visit: Payer: 59 | Admitting: Medical-Surgical

## 2022-01-06 NOTE — Progress Notes (Signed)
?  HPI with pertinent ROS:  ? ?CC: discuss fatigue ? ?HPI: ?Pleasant 56 year old female presenting today for discussion about continued fatigue.  Notes that since her last visit, her fatigue has worsened rather than improved.  She does still work Monday through Friday and notes that she gets up in the morning, eats a breakfast with at least some protein, and then goes to work.  At the end of her workday, she comes home and is severely exhausted and all she wants to do is sit down, rest, or sleep.  On the weekends, she does go out walking with her husband on the [place] in the neighborhood but no other intentional exercise.  She does note that she snores but has not been told she stops breathing in her sleep.  She has excessive daytime fatigue and is still having episodes of weakness, shakiness, and "spaghetti legs".  She did have a gastrectomy in 2015 and has not recently had micronutrients checked.  She does have a history of iron deficiency anemia and her most recent iron panel showed low percent saturation and low total iron.  She has had iron infusions in the past and notes that she felt much better when she had those completed. ? ?STOP-BANG for SLEEP APNEA ?Do you Snore loudly? Yes ?Do you often feel Tired during day? Yes ?Has anyone Observed you stop breathing? No ?History of high blood Pressure? Yes ?BMI >35? Yes ?Age >50? Yes ?Neck circumference >16 in? Yes ?Gender female? No ?5-8 = high risk ?3-4 = intermediate ?0-2 = low risk  ? ?I reviewed the past medical history, family history, social history, surgical history, and allergies today and no changes were needed.  Please see the problem list section below in epic for further details. ? ? ?Physical exam:  ? ?General: Well Developed, well nourished, and in no acute distress.  ?Neuro: Alert and oriented x3.  ?HEENT: Normocephalic, atraumatic.  ?Skin: Warm and dry. ?Cardiac: Regular rate and rhythm, no murmurs rubs or gallops, no lower extremity edema.   ?Respiratory: Clear to auscultation bilaterally. Not using accessory muscles, speaking in full sentences. ? ?Impression and Recommendations:   ? ?1. Fatigue, unspecified type ?Unclear etiology.  She does feel that her worsened mood symptoms are more related to her fatigue rather than the other way around.  Checking micronutrients today.  Known iron deficiency so recommend starting a daily oral iron supplement or a multivitamin containing iron. ?- Folate ?- Copper, serum ?- Zinc ?- Vitamin B12 ?- Vitamin B1 ?- VITAMIN D 25 Hydroxy (Vit-D Deficiency, Fractures) ?- Home sleep test ? ?2. Snoring ?Home sleep study ordered.  Stop bang score of 5 places her at high risk. ?- Home sleep test ? ?3. Iron deficiency anemia secondary to inadequate dietary iron intake ?With her history and previous iron infusions, we will refer her to hematology for possible repeat iron infusions. ?- Ambulatory referral to Hematology / Oncology ? ?Return if symptoms worsen or fail to improve. ?___________________________________________ ?Thayer Ohm, DNP, APRN, FNP-BC ?Primary Care and Sports Medicine ?[facility] ?

## 2022-01-07 ENCOUNTER — Ambulatory Visit (INDEPENDENT_AMBULATORY_CARE_PROVIDER_SITE_OTHER): Payer: 59 | Admitting: Medical-Surgical

## 2022-01-07 VITALS — BP 139/94 | HR 74 | Ht 64.0 in | Wt 218.0 lb

## 2022-01-07 DIAGNOSIS — D508 Other iron deficiency anemias: Secondary | ICD-10-CM

## 2022-01-07 DIAGNOSIS — R0683 Snoring: Secondary | ICD-10-CM

## 2022-01-07 DIAGNOSIS — R5383 Other fatigue: Secondary | ICD-10-CM | POA: Diagnosis not present

## 2022-01-08 ENCOUNTER — Encounter: Payer: Self-pay | Admitting: Family

## 2022-01-08 ENCOUNTER — Inpatient Hospital Stay (HOSPITAL_BASED_OUTPATIENT_CLINIC_OR_DEPARTMENT_OTHER): Payer: 59 | Admitting: Family

## 2022-01-08 ENCOUNTER — Inpatient Hospital Stay: Payer: 59 | Attending: Hematology & Oncology

## 2022-01-08 VITALS — BP 131/92 | HR 73 | Temp 98.6°F | Resp 16 | Ht 64.0 in | Wt 217.8 lb

## 2022-01-08 DIAGNOSIS — Z9884 Bariatric surgery status: Secondary | ICD-10-CM | POA: Insufficient documentation

## 2022-01-08 DIAGNOSIS — R5383 Other fatigue: Secondary | ICD-10-CM | POA: Diagnosis not present

## 2022-01-08 DIAGNOSIS — Z885 Allergy status to narcotic agent status: Secondary | ICD-10-CM | POA: Insufficient documentation

## 2022-01-08 DIAGNOSIS — Z880 Allergy status to penicillin: Secondary | ICD-10-CM | POA: Insufficient documentation

## 2022-01-08 DIAGNOSIS — D508 Other iron deficiency anemias: Secondary | ICD-10-CM | POA: Insufficient documentation

## 2022-01-08 DIAGNOSIS — R42 Dizziness and giddiness: Secondary | ICD-10-CM | POA: Diagnosis not present

## 2022-01-08 DIAGNOSIS — K909 Intestinal malabsorption, unspecified: Secondary | ICD-10-CM | POA: Insufficient documentation

## 2022-01-08 DIAGNOSIS — Z79899 Other long term (current) drug therapy: Secondary | ICD-10-CM | POA: Insufficient documentation

## 2022-01-08 LAB — CMP (CANCER CENTER ONLY)
ALT: 17 U/L (ref 0–44)
AST: 17 U/L (ref 15–41)
Albumin: 4.2 g/dL (ref 3.5–5.0)
Alkaline Phosphatase: 80 U/L (ref 38–126)
Anion gap: 9 (ref 5–15)
BUN: 21 mg/dL — ABNORMAL HIGH (ref 6–20)
CO2: 29 mmol/L (ref 22–32)
Calcium: 9.6 mg/dL (ref 8.9–10.3)
Chloride: 104 mmol/L (ref 98–111)
Creatinine: 0.7 mg/dL (ref 0.44–1.00)
GFR, Estimated: >60 mL/min (ref >60)
Glucose, Bld: 88 mg/dL (ref 70–99)
Potassium: 4.1 mmol/L (ref 3.5–5.1)
Sodium: 142 mmol/L (ref 135–145)
Total Bilirubin: 0.4 mg/dL (ref 0.3–1.2)
Total Protein: 6.9 g/dL (ref 6.5–8.1)

## 2022-01-08 LAB — CBC WITH DIFFERENTIAL (CANCER CENTER ONLY)
Abs Immature Granulocytes: 0.04 10*3/uL (ref 0.00–0.07)
Basophils Absolute: 0.1 10*3/uL (ref 0.0–0.1)
Basophils Relative: 1 %
Eosinophils Absolute: 0.1 10*3/uL (ref 0.0–0.5)
Eosinophils Relative: 1 %
HCT: 39.8 % (ref 36.0–46.0)
Hemoglobin: 12.6 g/dL (ref 12.0–15.0)
Immature Granulocytes: 0 %
Lymphocytes Relative: 25 %
Lymphs Abs: 2.6 10*3/uL (ref 0.7–4.0)
MCH: 27.1 pg (ref 26.0–34.0)
MCHC: 31.7 g/dL (ref 30.0–36.0)
MCV: 85.6 fL (ref 80.0–100.0)
Monocytes Absolute: 0.5 10*3/uL (ref 0.1–1.0)
Monocytes Relative: 5 %
Neutro Abs: 7.2 10*3/uL (ref 1.7–7.7)
Neutrophils Relative %: 68 %
Platelet Count: 361 10*3/uL (ref 150–400)
RBC: 4.65 MIL/uL (ref 3.87–5.11)
RDW: 15.6 % — ABNORMAL HIGH (ref 11.5–15.5)
WBC Count: 10.4 10*3/uL (ref 4.0–10.5)
nRBC: 0 % (ref 0.0–0.2)

## 2022-01-08 LAB — RETICULOCYTES
Immature Retic Fract: 9.6 % (ref 2.3–15.9)
RBC.: 4.58 MIL/uL (ref 3.87–5.11)
Retic Count, Absolute: 50.4 10*3/uL (ref 19.0–186.0)
Retic Ct Pct: 1.1 % (ref 0.4–3.1)

## 2022-01-08 LAB — VITAMIN B12: Vitamin B-12: 783 pg/mL (ref 180–914)

## 2022-01-08 LAB — SAVE SMEAR(SSMR), FOR PROVIDER SLIDE REVIEW

## 2022-01-08 NOTE — Progress Notes (Signed)
?Hematology and Oncology Follow Up Visit ? ?Meagan Floyd ?[id number] ?1966-07-28 56 y.o. ?01/08/2022 ? ? 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  ?Current Therapy:        ?IV iron as indicated  ?  ?Interim History:  Meagan Floyd is here today to re-establish care. We last saw her in 2020 for this same issue of iron deficiency.  ?In February she had an iron saturation of 11% and ferritin 35.  ?She has not noted any blood loss. No abnormal bruising, no petechiae.  ?She is feeling very fatigued and has had some lightheadedness.  ?She has had a few episodes of palpitations but states that her work up and Holter monitor report were negative.  ?No fever, chills, n/v, cough, rash, SOB, chest pain, abdominal pain or changes in bowel or bladder habits.  ?No swelling, tenderness, numbness or tingling in her extremities at times.  ?No falls or syncope.  ?She has a good appetite and is doing her best to stay well hydrated. Her weight is stable at 217 lbs.  ? ?ECOG Performance Status: 1 - Symptomatic but completely ambulatory ? ?Medications:  ?Allergies as of 01/08/2022   ? ?   Reactions  ? Shingrix [zoster Vac Recomb Adjuvanted] Nausea And Vomiting  ? High fever  ? Belsomra [suvorexant] Other (See Comments)  ? paresthesias  ? Codeine Nausea Only  ? Penicillins Rash  ? ?  ? ?  ?Medication List  ?  ? ?  ? Accurate as of January 08, 2022 12:09 PM. If you have any questions, ask your nurse or doctor.  ?  ?  ? ?  ? ?DULoxetine 60 MG capsule ?Commonly known as: CYMBALTA ?TAKE 1 CAPSULE BY MOUTH EVERY DAY ?  ?scopolamine 1 MG/3DAYS ?Commonly known as: Transderm-Scop (1.5 MG) ?Place 1 patch (1.5 mg total) onto the skin every 3 (three) days. ?  ? ?  ? ? ?Allergies:  ?Allergies  ?Allergen Reactions  ? Shingrix [Zoster Vac Recomb Adjuvanted] Nausea And Vomiting  ?  High fever  ? Belsomra [Suvorexant] Other (See Comments)  ?  paresthesias  ? Codeine Nausea Only  ?  Penicillins Rash  ? ? ?Past Medical History, Surgical history, Social history, and Family History were reviewed and updated. ? ?Review of Systems: ?All other 10 point review of systems is negative.  ? ?Physical Exam: ? height is 5\' 4"  (1.626 m) and weight is 217 lb 12.8 oz (98.8 kg). Her oral temperature is 98.6 ?F (37 ?C). Her blood pressure is 131/92 (abnormal) and her pulse is 73. Her respiration is 16 and oxygen saturation is 100%.  ? ?Wt Readings from Last 3 Encounters:  ?01/08/22 217 lb 12.8 oz (98.8 kg)  ?01/07/22 218 lb (98.9 kg)  ?11/25/21 216 lb 3.2 oz (98.1 kg)  ? ? ?Ocular: Sclerae unicteric, pupils equal, round and reactive to light ?Ear-nose-throat: Oropharynx clear, dentition fair ?Lymphatic: No cervical or supraclavicular adenopathy ?Lungs no rales or rhonchi, good excursion bilaterally ?Heart regular rate and rhythm, no murmur appreciated ?Abd soft, nontender, positive bowel sounds ?MSK no focal spinal tenderness, no joint edema ?Neuro: non-focal, well-oriented, appropriate affect ?Breasts: Deferred  ? ?Lab Results  ?Component Value Date  ? WBC 10.4 01/08/2022  ? HGB 12.6 01/08/2022  ? HCT 39.8 01/08/2022  ? MCV 85.6 01/08/2022  ? PLT 361 01/08/2022  ? ?Lab Results  ?Component Value Date  ? FERRITIN 35 11/26/2021  ? IRON 38 (L) 11/26/2021  ?  TIBC 339 11/26/2021  ? UIBC 294 09/16/2019  ? IRONPCTSAT 11 (L) 11/26/2021  ? ?Lab Results  ?Component Value Date  ? RETICCTPCT 1.1 01/08/2022  ? RBC 4.65 01/08/2022  ? RBC 4.58 01/08/2022  ? RETICCTABS 46,700 08/29/2020  ? ?No results found for: KPAFRELGTCHN, LAMBDASER, KAPLAMBRATIO ?No results found for: IGGSERUM, IGA, IGMSERUM ?No results found for: TOTALPROTELP, ALBUMINELP, A1GS, A2GS, BETS, BETA2SER, GAMS, MSPIKE, SPEI ?  Chemistry   ?   ?Component Value Date/Time  ? NA 142 01/08/2022 1110  ? K 4.1 01/08/2022 1110  ? CL 104 01/08/2022 1110  ? CO2 29 01/08/2022 1110  ? BUN 21 (H) 01/08/2022 1110  ? CREATININE 0.70 01/08/2022 1110  ? CREATININE 0.83  11/26/2021 0000  ?    ?Component Value Date/Time  ? CALCIUM 9.6 01/08/2022 1110  ? ALKPHOS 80 01/08/2022 1110  ? AST 17 01/08/2022 1110  ? ALT 17 01/08/2022 1110  ? BILITOT 0.4 01/08/2022 1110  ?  ? ? ? ?Impression and Plan: Meagan Floyd is a very pleasant 56 yo caucasian female with 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  ?Iron studies are pending. We will replace if needed.  ?Follow-up in 6 months.  ? ?Meagan Stanford, NP ?4/12/202312:09 PM ? ?

## 2022-01-09 ENCOUNTER — Telehealth: Payer: Self-pay | Admitting: *Deleted

## 2022-01-09 LAB — IRON AND IRON BINDING CAPACITY (CC-WL,HP ONLY)
Iron: 57 ug/dL (ref 28–170)
Saturation Ratios: 16 % (ref 10.4–31.8)
TIBC: 367 ug/dL (ref 250–450)
UIBC: 310 ug/dL (ref 148–442)

## 2022-01-09 LAB — ERYTHROPOIETIN: Erythropoietin: 20.1 m[IU]/mL — ABNORMAL HIGH (ref 2.6–18.5)

## 2022-01-09 LAB — FERRITIN: Ferritin: 44 ng/mL (ref 11–307)

## 2022-01-09 NOTE — Telephone Encounter (Signed)
Per 01/08/22 los - called and lvm of upcoming appointments - request call back to confirm ?

## 2022-01-10 ENCOUNTER — Other Ambulatory Visit: Payer: Self-pay

## 2022-01-10 DIAGNOSIS — R0683 Snoring: Secondary | ICD-10-CM

## 2022-01-14 LAB — VITAMIN D 25 HYDROXY (VIT D DEFICIENCY, FRACTURES): Vit D, 25-Hydroxy: 22 ng/mL — ABNORMAL LOW (ref 30–100)

## 2022-01-14 LAB — VITAMIN B1: Vitamin B1 (Thiamine): 11 nmol/L (ref 8–30)

## 2022-01-14 LAB — ZINC: Zinc: 75 ug/dL (ref 60–130)

## 2022-01-14 LAB — FOLATE: Folate: 9.2 ng/mL

## 2022-01-14 LAB — COPPER, SERUM: Copper: 151 ug/dL (ref 70–175)

## 2022-02-04 ENCOUNTER — Encounter: Payer: Self-pay | Admitting: Medical-Surgical

## 2022-03-25 DIAGNOSIS — K289 Gastrojejunal ulcer, unspecified as acute or chronic, without hemorrhage or perforation: Secondary | ICD-10-CM | POA: Insufficient documentation

## 2022-04-09 ENCOUNTER — Encounter (HOSPITAL_BASED_OUTPATIENT_CLINIC_OR_DEPARTMENT_OTHER): Payer: 59 | Admitting: Internal Medicine

## 2022-04-14 ENCOUNTER — Ambulatory Visit: Payer: 59 | Admitting: Physician Assistant

## 2022-04-14 IMAGING — MG MM DIGITAL SCREENING BILAT W/ TOMO AND CAD
8 series · 8 of 24 positions shown · non-contrast
Comparison: Previous exam(s).

CLINICAL DATA: Screening.

EXAM:
DIGITAL SCREENING BILATERAL MAMMOGRAM WITH TOMOSYNTHESIS AND CAD
TECHNIQUE: Bilateral screening digital craniocaudal and mediolateral oblique
mammograms were obtained. Bilateral screening digital breast
tomosynthesis was performed. The images were evaluated with
computer-aided detection.

[R CC synth-2D]
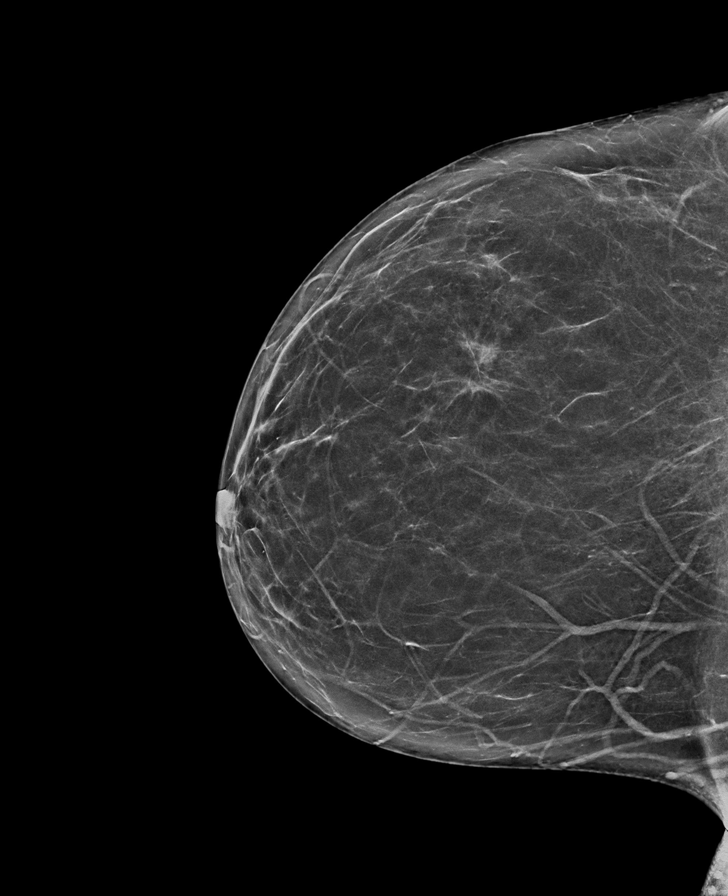

[L MLO synth-2D]
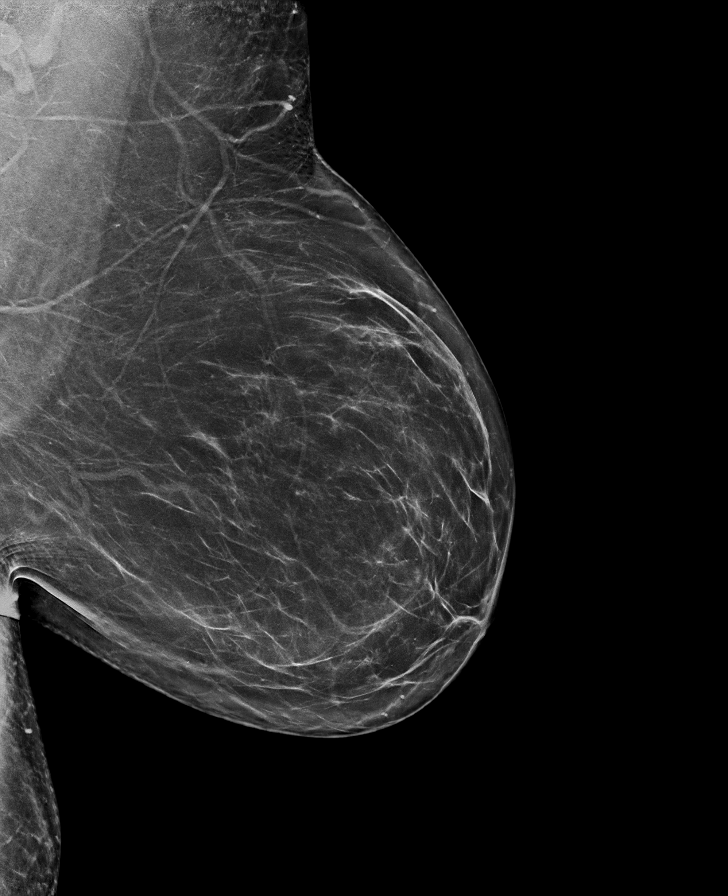

[L CC synth-2D]
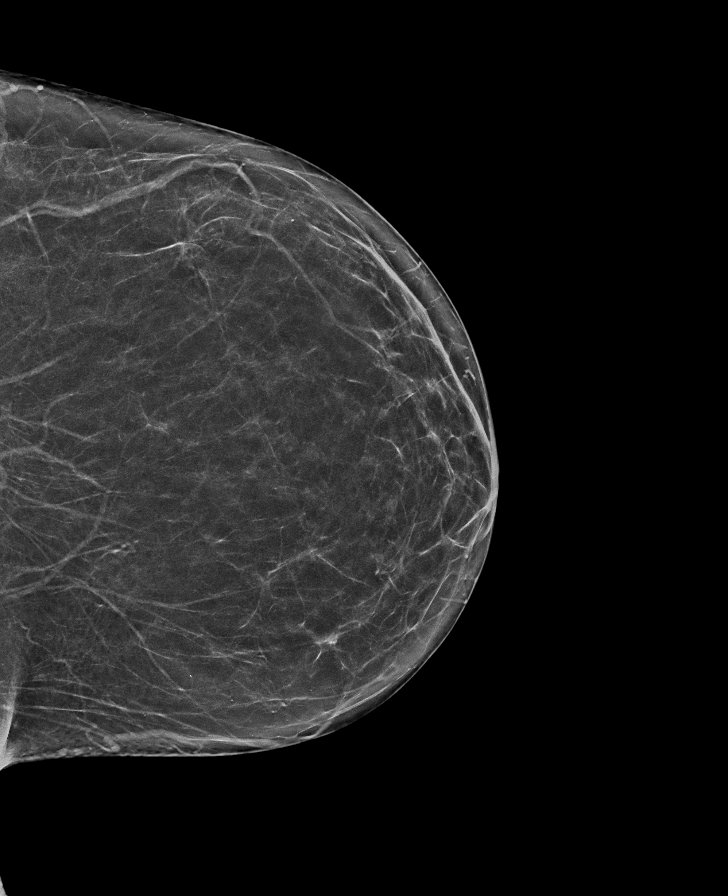

[R MLO synth-2D]
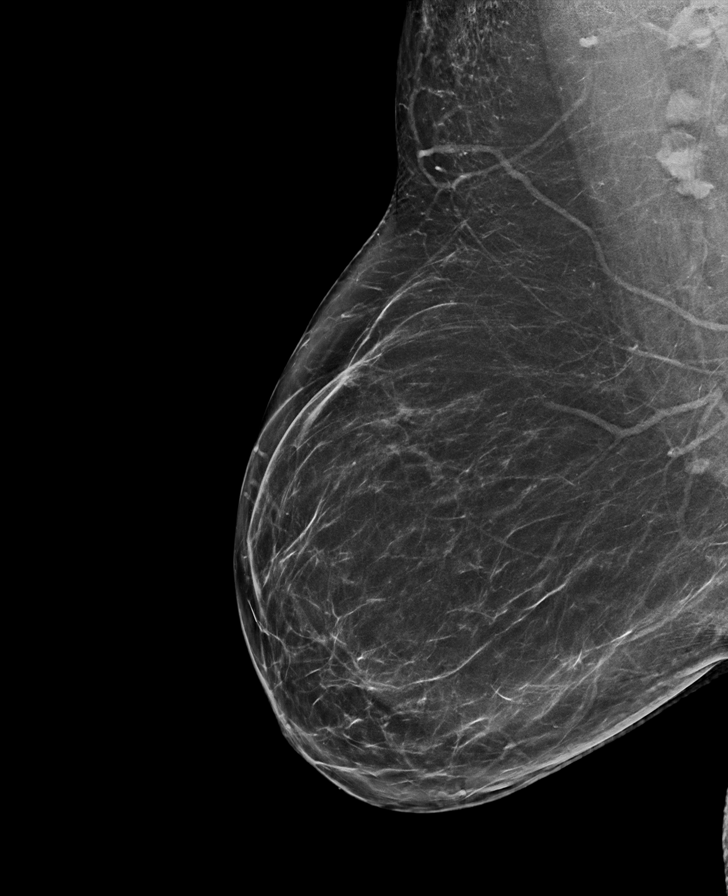

[R CC tomo · tomo slice 35/68.0]
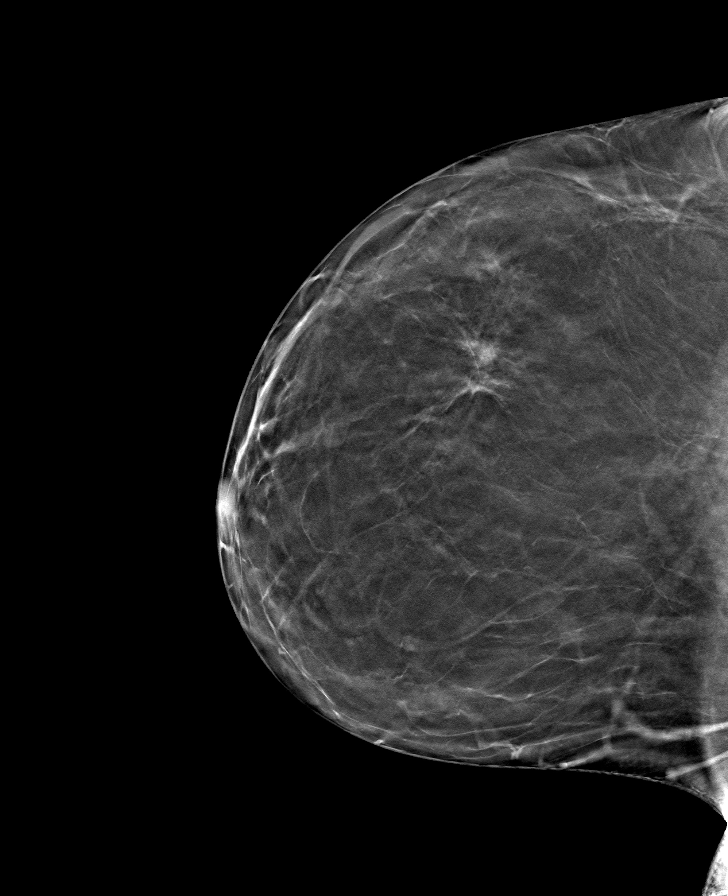

[R MLO tomo · tomo slice 42/83.0]
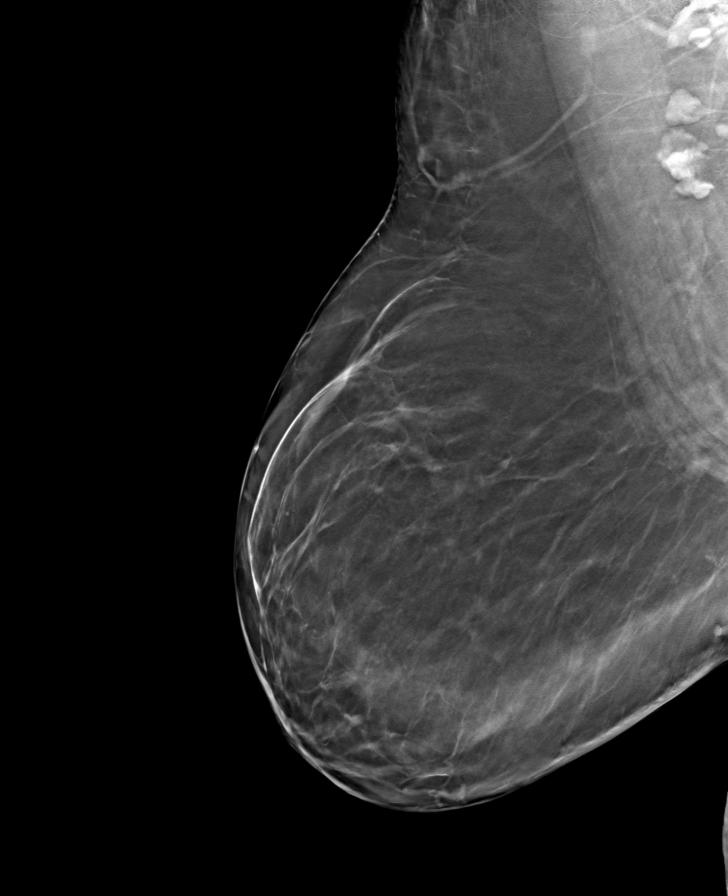

[L MLO tomo · tomo slice 43/84.0]
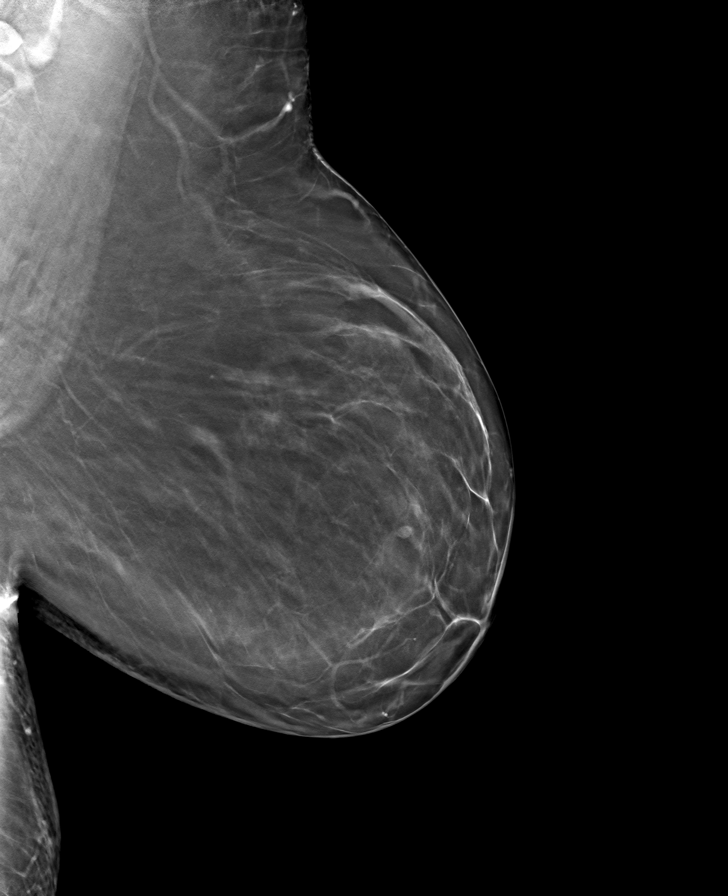

[L CC tomo · tomo slice 34/67.0]
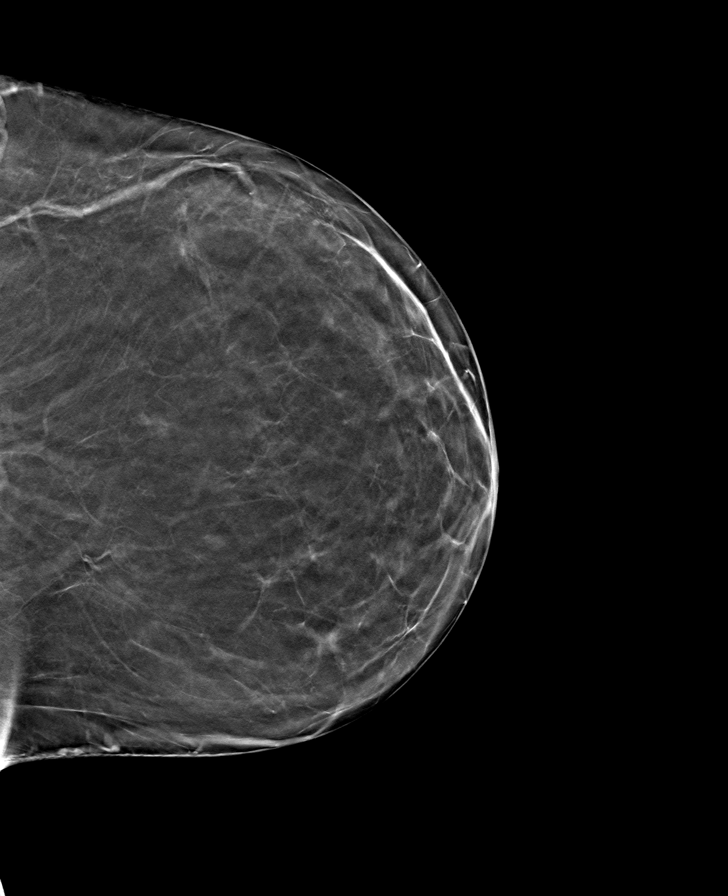

[8 of 24 positions shown; findings below may reference images not displayed]

ACR Breast Density Category b: There are scattered areas of
fibroglandular density.
FINDINGS: In the right breast, possible distortion warrants further
evaluation. In the left breast, no findings suspicious for
malignancy.
IMPRESSION: Further evaluation is suggested for possible distortion in the right
breast.

RECOMMENDATION:
Diagnostic mammogram and possibly ultrasound of the right breast.
(Code:00-N-XXA)

The patient will be contacted regarding the findings, and additional
imaging will be scheduled.

BI-RADS CATEGORY  0: Incomplete. Need additional imaging evaluation
and/or prior mammograms for comparison.

## 2022-04-17 ENCOUNTER — Encounter: Payer: Self-pay | Admitting: Medical-Surgical

## 2022-04-17 ENCOUNTER — Ambulatory Visit (INDEPENDENT_AMBULATORY_CARE_PROVIDER_SITE_OTHER): Payer: 59 | Admitting: Medical-Surgical

## 2022-04-17 VITALS — BP 127/82 | HR 80 | Resp 20 | Ht 64.0 in | Wt 213.7 lb

## 2022-04-17 DIAGNOSIS — Z09 Encounter for follow-up examination after completed treatment for conditions other than malignant neoplasm: Secondary | ICD-10-CM | POA: Diagnosis not present

## 2022-04-17 DIAGNOSIS — R519 Headache, unspecified: Secondary | ICD-10-CM

## 2022-04-17 MED ORDER — KETOROLAC TROMETHAMINE 30 MG/ML IJ SOLN
30.0000 mg | Freq: Once | INTRAMUSCULAR | Status: AC
  Administered 2022-04-17: 30 mg via INTRAMUSCULAR

## 2022-04-17 MED ORDER — DULOXETINE HCL 60 MG PO CPEP: ORAL_CAPSULE | ORAL | 1 refills | Status: DC

## 2022-04-17 MED ORDER — DEXAMETHASONE SODIUM PHOSPHATE 10 MG/ML IJ SOLN
10.0000 mg | Freq: Once | INTRAMUSCULAR | Status: AC
  Administered 2022-04-17: 10 mg via INTRAMUSCULAR

## 2022-04-17 NOTE — Progress Notes (Signed)
   Established Patient Office Visit  Subjective   Patient ID: Meagan Floyd, female   DOB: 24-Jan-1966 Age: 56 y.o. MRN: [id number]   Chief Complaint  Patient presents with   Hospitalization Follow-up   HPI Pleasant 56 year old female presenting today for hospital discharge follow-up.  She was admitted to the hospital on 6/26 with a gastric ulcer as well as a bacterial infection of the intestines.  She was discharged on 6/28 in stable condition with oral antibiotics. Today, she reports feeling much better with full resolution of her gastric discomfort. She is taking Carafate and Prilosec as instructed and has a follow up with GI coming up soon. No nausea, vomiting, diarrhea, constipation, melena, or hematochezia.   Had a sinus headache in the hospital and it has not resolved since then. Every day has had the sinus pressure and pain just between the eyes and at the bridge of the nose. No other symptoms. Cannot take NSAIDs. Tylenol unhelpful.    Objective:    Vitals:   04/17/22 1408  BP: 127/82  Pulse: 80  Resp: 20  Height: 5\' 4"  (1.626 m)  Weight: 213 lb 11.2 oz (96.9 kg)  SpO2: 97%  BMI (Calculated): 36.66    Physical Exam Vitals and nursing note reviewed.  Constitutional:      General: She is not in acute distress.    Appearance: Normal appearance. She is not ill-appearing.  HENT:     Head: Normocephalic and atraumatic.  Cardiovascular:     Rate and Rhythm: Normal rate and regular rhythm.  Pulmonary:     Effort: Pulmonary effort is normal. No respiratory distress.  Skin:    General: Skin is warm and dry.  Neurological:     Mental Status: She is alert and oriented to person, place, and time.  Psychiatric:        Mood and Affect: Mood normal.        Behavior: Behavior normal.        Thought Content: Thought content normal.        Judgment: Judgment normal.   No results found for this or any previous visit (from the past 24 hour(s)).     The ASCVD Risk score (Arnett  DK, et al., 2019) failed to calculate for the following reasons:   Cannot find a previous HDL lab   Cannot find a previous total cholesterol lab   Assessment & Plan:   1. Hospital discharge follow-up Reviewed hospital records and discharge summary. Discussed previous and current symptoms. Continue Prilosec and Carafate as prescribed. Plan to follow up with GI as scheduled. If symptoms worsen or she begins to have blood in her stool, advised her to seek emergency care. Avoid all oral NSAIDs.  2. Sinus headache Persistent sinus headache for 1 month with no improvement with OTC medications. Treating with Decadron and Toradol IM in office.  - ketorolac (TORADOL) 30 MG/ML injection 30 mg - dexamethasone (DECADRON) injection 10 mg  Return in about 6 months (around 10/18/2022) for chronic disease follow up. ___________________________________________ 10/20/2022, DNP, APRN, FNP-BC Primary Care and Sports Medicine [facility]

## 2022-04-21 ENCOUNTER — Emergency Department
Admission: EM | Admit: 2022-04-21 | Discharge: 2022-04-21 | Disposition: A | Discharge status: Home / Self Care | Payer: 59

## 2022-04-21 DIAGNOSIS — R1013 Epigastric pain: Secondary | ICD-10-CM | POA: Diagnosis not present

## 2022-04-21 NOTE — Discharge Instructions (Addendum)
Advised patient to go to [facility] NOW for further evaluation abdominal pain.

## 2022-04-21 NOTE — ED Provider Notes (Signed)
Meagan Floyd CARE    CSN: [id number] Arrival date & time: 04/21/22  1335      History   Chief Complaint Chief Complaint  Patient presents with   Abdominal Pain    HPI Meagan Floyd is a 56 y.o. female.   HPI 56 year old female presents with abdominal pain for days.  Patient reports last month she was admitted to hospital for infection of her intestines and stomach ulcer patient reports pain is the same.  Chart review reveals patient was evaluated for epigastric abdominal pain at [facility] on 03/24/2022 found to have anastomotic ulcer.  PMH significant for morbid obesity, anastomotic ulcer, tachycardia, and history of gastric bypass.   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   Dysphagia 04/13/2019   Globus sensation 04/13/2019   Tachycardia with heart rate 100-120 beats per minute 04/13/2019   Postmenopausal 04/13/2019   DOE (dyspnea on exertion) 04/13/2019   Vitamin A deficiency 03/13/2019   Secondary hyperparathyroidism, non-renal ([place]) 03/13/2019   Caregiver stress 08/10/2018   Generalized anxiety disorder with panic attacks 07/13/2018   Pseudotumor cerebri 02/04/2018   Chronic iron deficiency anemia 09/18/2017   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   Word finding difficulty 08/11/2017   Memory loss of unknown cause 08/11/2017   Insomnia due to other mental disorder 08/11/2017   ESR raised 08/11/2017    Moderate episode of recurrent major depressive disorder ([place]) 08/10/2017   Obesity (BMI 30-39.9) 08/29/2015   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 Authorizing Provider  DULoxetine (CYMBALTA) 60 MG capsule TAKE 1 CAPSULE BY MOUTH EVERY DAY 04/17/22   Samuel Bouche, NP  omeprazole (PRILOSEC OTC) 20 MG tablet Take 20 mg by mouth 2 (two) times daily. Patient not taking: Reported on 04/21/2022 03/26/22   [provider]  sucralfate (CARAFATE) 1 GM/10ML suspension Take 1 g by mouth 3 (three) times daily. 03/26/22   [provider]    Family History Family History  Problem Relation Age of Onset   Hypertension Mother    Depression Mother  Cancer Other        skin   Hypertension Father    Dementia Father    Bipolar disorder Sister    Melanoma Maternal Grandmother    Colon cancer Neg Hx    Esophageal cancer Neg Hx     Social History Social History   Tobacco Use   Smoking status: Never   Smokeless tobacco: Never  Vaping Use   Vaping Use: Never used  Substance Use Topics   Alcohol use: No   Drug use: No     Allergies   Shingrix [zoster vac recomb adjuvanted], Belsomra [suvorexant], Codeine, and Penicillins   Review of Systems Review of Systems  Gastrointestinal:  Positive for abdominal pain.     Physical Exam Triage Vital Signs ED Triage Vitals  Enc Vitals Group     BP 04/21/22 1344 139/88     Pulse Rate 04/21/22 1344 94     Resp 04/21/22 1344 16     Temp 04/21/22 1344 99.8  F (37.7 C)     Temp Source 04/21/22 1344 Oral     SpO2 04/21/22 1344 98 %     Weight --      Height --      Head Circumference --      Peak Flow --      Pain Score 04/21/22 1343 6     Pain Loc --      Pain Edu? --      Excl. in [place]? --    No data found.  Updated Vital Signs BP 139/88 (BP Location: Right Arm)   Pulse 94   Temp 99.8 F (37.7 C) (Oral)   Resp 16   SpO2 98%    Physical Exam Vitals and nursing note reviewed.  Constitutional:      General: She is not in acute distress.    Appearance: She is well-developed. She is obese. She is ill-appearing. She is not diaphoretic.  HENT:     Head: Normocephalic and atraumatic.     Mouth/Throat:     Mouth: Mucous membranes are moist.     Pharynx: Oropharynx is clear.  Eyes:     Extraocular Movements: Extraocular movements intact.     Pupils: Pupils are equal, round, and reactive to light.  Cardiovascular:     Rate and Rhythm: Normal rate and regular rhythm.     Heart sounds: Normal heart sounds.  Pulmonary:     Effort: Pulmonary effort is normal.     Breath sounds: Normal breath sounds. No wheezing, rhonchi or rales.  Abdominal:     General: Abdomen is flat. Bowel sounds are absent. There is no distension or abdominal bruit.     Palpations: Abdomen is rigid. There is no shifting dullness, fluid wave, hepatomegaly, splenomegaly, mass or pulsatile mass.     Tenderness: There is abdominal tenderness in the right upper quadrant, epigastric area and left upper quadrant. There is no right CVA tenderness, left CVA tenderness, guarding or rebound. Negative signs include Murphy's sign, McBurney's sign and psoas sign.     Hernia: No hernia is present.  Skin:    General: Skin is warm and dry.  Neurological:     General: No focal deficit present.     Mental Status: She is alert and oriented to person, place, and time.      UC Treatments / Results  Labs (all labs ordered are listed, but only abnormal results are  displayed) Labs Reviewed - No data to display  EKG  Radiology No results found.  Procedures Procedures (including critical care time)  Medications Ordered in UC Medications - No data to display  Initial Impression / Assessment and Plan / UC Course  I have reviewed the triage vital signs and the nursing notes.  Pertinent labs & imaging results that were available during my care of the patient were reviewed by me and considered in my medical decision making (see chart for details).     MDM: 1.  Epigastric pain-Advised patient to go to [place] for further evaluation abdominal pain.  Patient agreed and verbalized understanding of these instructions and this plan of care this afternoon.  Patient discharged, hemodynamically stable. Final Clinical Impressions(s) / UC Diagnoses   Final diagnoses:  Epigastric pain     Discharge Instructions      Advised patient to go to [place] for further evaluation abdominal pain.      ED Prescriptions   None    PDMP not reviewed this encounter.   Eliezer Lofts, [place] 04/21/22 1429

## 2022-04-21 NOTE — ED Triage Notes (Signed)
Pt presents with c/o abdominal pain that began Saturday. Pt states last month she was admitted to the hospital for an infection in her intestines and a stomach ulcer. Pt states pain is similar.

## 2022-04-22 ENCOUNTER — Telehealth: Payer: Self-pay | Admitting: General Practice

## 2022-04-22 ENCOUNTER — Telehealth: Payer: Self-pay

## 2022-04-22 NOTE — Telephone Encounter (Signed)
Left VM for pt to check on status since UC visit. Pt did go to ED as recommended by UC provider. UC phone number left on VM advising pt to call if any questions.

## 2022-04-22 NOTE — Telephone Encounter (Signed)
Transition Care Management Unsuccessful Follow-up Telephone Call  Date of discharge and from where:  04/21/22 from [facility]  Attempts:  1st Attempt  Reason for unsuccessful TCM follow-up call:  Left voice message

## 2022-04-23 NOTE — Telephone Encounter (Signed)
Transition Care Management Unsuccessful Follow-up Telephone Call  Date of discharge and from where:  04/21/22 from [facility]  Attempts:  2nd Attempt  Reason for unsuccessful TCM follow-up call:  Left voice message

## 2022-04-29 NOTE — Telephone Encounter (Signed)
Transition Care Management Unsuccessful Follow-up Telephone Call  Date of discharge and from where:  04/21/22 from [facility]  Attempts:  3rd Attempt  Reason for unsuccessful TCM follow-up call:  Left voice message

## 2022-04-30 DIAGNOSIS — K922 Gastrointestinal hemorrhage, unspecified: Secondary | ICD-10-CM

## 2022-04-30 HISTORY — DX: Gastrointestinal hemorrhage, unspecified: K92.2

## 2022-05-05 ENCOUNTER — Telehealth: Payer: Self-pay | Admitting: General Practice

## 2022-05-05 NOTE — Telephone Encounter (Signed)
Transition Care Management Unsuccessful Follow-up Telephone Call  Date of discharge and from where:  05/02/22 from [facility]  Attempts:  1st Attempt  Reason for unsuccessful TCM follow-up call:  Left voice message    

## 2022-05-06 NOTE — Telephone Encounter (Signed)
Transition Care Management Unsuccessful Follow-up Telephone Call  Date of discharge and from where:  05/02/22 from [facility]  Attempts:  2nd Attempt  Reason for unsuccessful TCM follow-up call:  Left voice message

## 2022-05-12 NOTE — Telephone Encounter (Signed)
Transition Care Management Unsuccessful Follow-up Telephone Call  Date of discharge and from where:  05/02/22 from [facility]  Attempts:  3rd Attempt  Reason for unsuccessful TCM follow-up call:  Left voice message Patient has an appt with PCP tomorrow.

## 2022-05-12 NOTE — Progress Notes (Unsigned)
   Established Patient Office Visit  Subjective   Patient ID: Meagan Floyd, female   DOB: Sep 06, 1966 Age: 56 y.o. MRN: [id number]   No chief complaint on file.   HPI Pleasant 56 year old female presenting today for hospital follow-up after being evaluated for GI bleed.   Objective:    There were no vitals filed for this visit.  Physical Exam   No results found for this or any previous visit (from the past 24 hour(s)).   {Labs (Optional):23779}  The ASCVD Risk score (Arnett DK, et al., 2019) failed to calculate for the following reasons:   Cannot find a previous HDL lab   Cannot find a previous total cholesterol lab   Assessment & Plan:   No problem-specific Assessment & Plan notes found for this encounter.   No follow-ups on file.  ___________________________________________ Thayer Ohm, DNP, APRN, FNP-BC Primary Care and Sports Medicine [facility]

## 2022-05-13 ENCOUNTER — Encounter: Payer: Self-pay | Admitting: Medical-Surgical

## 2022-05-13 ENCOUNTER — Ambulatory Visit (INDEPENDENT_AMBULATORY_CARE_PROVIDER_SITE_OTHER): Payer: 59 | Admitting: Medical-Surgical

## 2022-05-13 VITALS — BP 117/72 | HR 99 | Resp 20 | Ht 64.0 in | Wt 212.0 lb

## 2022-05-13 DIAGNOSIS — K922 Gastrointestinal hemorrhage, unspecified: Secondary | ICD-10-CM

## 2022-05-13 DIAGNOSIS — Z9884 Bariatric surgery status: Secondary | ICD-10-CM | POA: Diagnosis not present

## 2022-05-13 DIAGNOSIS — Z09 Encounter for follow-up examination after completed treatment for conditions other than malignant neoplasm: Secondary | ICD-10-CM | POA: Diagnosis not present

## 2022-05-25 ENCOUNTER — Emergency Department (HOSPITAL_BASED_OUTPATIENT_CLINIC_OR_DEPARTMENT_OTHER): Payer: 59

## 2022-05-25 ENCOUNTER — Encounter (HOSPITAL_BASED_OUTPATIENT_CLINIC_OR_DEPARTMENT_OTHER): Payer: Self-pay | Admitting: Urology

## 2022-05-25 ENCOUNTER — Other Ambulatory Visit: Payer: Self-pay

## 2022-05-25 ENCOUNTER — Inpatient Hospital Stay (HOSPITAL_BASED_OUTPATIENT_CLINIC_OR_DEPARTMENT_OTHER)
Admission: EM | Admit: 2022-05-25 | Discharge: 2022-05-28 | DRG: 378 | Disposition: A | Discharge status: Home / Self Care | Payer: 59 | Attending: Family Medicine | Admitting: Family Medicine

## 2022-05-25 DIAGNOSIS — K922 Gastrointestinal hemorrhage, unspecified: Secondary | ICD-10-CM | POA: Diagnosis not present

## 2022-05-25 DIAGNOSIS — I10 Essential (primary) hypertension: Secondary | ICD-10-CM | POA: Diagnosis present

## 2022-05-25 DIAGNOSIS — E669 Obesity, unspecified: Secondary | ICD-10-CM | POA: Diagnosis present

## 2022-05-25 DIAGNOSIS — Z9884 Bariatric surgery status: Secondary | ICD-10-CM

## 2022-05-25 DIAGNOSIS — Z887 Allergy status to serum and vaccine status: Secondary | ICD-10-CM

## 2022-05-25 DIAGNOSIS — F32A Depression, unspecified: Secondary | ICD-10-CM | POA: Diagnosis present

## 2022-05-25 DIAGNOSIS — Z885 Allergy status to narcotic agent status: Secondary | ICD-10-CM

## 2022-05-25 DIAGNOSIS — Z8249 Family history of ischemic heart disease and other diseases of the circulatory system: Secondary | ICD-10-CM

## 2022-05-25 DIAGNOSIS — Z880 Allergy status to penicillin: Secondary | ICD-10-CM

## 2022-05-25 DIAGNOSIS — Z818 Family history of other mental and behavioral disorders: Secondary | ICD-10-CM

## 2022-05-25 DIAGNOSIS — F419 Anxiety disorder, unspecified: Secondary | ICD-10-CM | POA: Diagnosis present

## 2022-05-25 DIAGNOSIS — D62 Acute posthemorrhagic anemia: Secondary | ICD-10-CM | POA: Diagnosis present

## 2022-05-25 DIAGNOSIS — Z888 Allergy status to other drugs, medicaments and biological substances status: Secondary | ICD-10-CM

## 2022-05-25 DIAGNOSIS — K920 Hematemesis: Secondary | ICD-10-CM | POA: Diagnosis not present

## 2022-05-25 DIAGNOSIS — Z6836 Body mass index (BMI) 36.0-36.9, adult: Secondary | ICD-10-CM

## 2022-05-25 DIAGNOSIS — Z79899 Other long term (current) drug therapy: Secondary | ICD-10-CM

## 2022-05-25 DIAGNOSIS — D75839 Thrombocytosis, unspecified: Secondary | ICD-10-CM | POA: Diagnosis present

## 2022-05-25 LAB — COMPREHENSIVE METABOLIC PANEL
ALT: 18 U/L (ref 0–44)
AST: 24 U/L (ref 15–41)
Albumin: 3.7 g/dL (ref 3.5–5.0)
Alkaline Phosphatase: 76 U/L (ref 38–126)
Anion gap: 11 (ref 5–15)
BUN: 24 mg/dL — ABNORMAL HIGH (ref 6–20)
CO2: 21 mmol/L — ABNORMAL LOW (ref 22–32)
Calcium: 9.3 mg/dL (ref 8.9–10.3)
Chloride: 108 mmol/L (ref 98–111)
Creatinine, Ser: 0.79 mg/dL (ref 0.44–1.00)
GFR, Estimated: >60 mL/min (ref >60)
Glucose, Bld: 102 mg/dL — ABNORMAL HIGH (ref 70–99)
Potassium: 3.9 mmol/L (ref 3.5–5.1)
Sodium: 140 mmol/L (ref 135–145)
Total Bilirubin: 0.3 mg/dL (ref 0.3–1.2)
Total Protein: 7 g/dL (ref 6.5–8.1)

## 2022-05-25 LAB — CBC
HCT: 26.5 % — ABNORMAL LOW (ref 36.0–46.0)
Hemoglobin: 8.2 g/dL — ABNORMAL LOW (ref 12.0–15.0)
MCH: 25.7 pg — ABNORMAL LOW (ref 26.0–34.0)
MCHC: 30.9 g/dL (ref 30.0–36.0)
MCV: 83.1 fL (ref 80.0–100.0)
Platelets: 484 10*3/uL — ABNORMAL HIGH (ref 150–400)
RBC: 3.19 MIL/uL — ABNORMAL LOW (ref 3.87–5.11)
RDW: 17 % — ABNORMAL HIGH (ref 11.5–15.5)
WBC: 12.8 10*3/uL — ABNORMAL HIGH (ref 4.0–10.5)
nRBC: 0 % (ref 0.0–0.2)

## 2022-05-25 LAB — LIPASE, BLOOD: Lipase: 28 U/L (ref 11–51)

## 2022-05-25 MED ORDER — ONDANSETRON HCL 4 MG/2ML IJ SOLN
4.0000 mg | Freq: Once | INTRAMUSCULAR | Status: AC
Start: 2022-05-25 — End: 2022-05-25
  Administered 2022-05-25: 4 mg via INTRAVENOUS
  Filled 2022-05-25: qty 2

## 2022-05-25 MED ORDER — HALOPERIDOL LACTATE 5 MG/ML IJ SOLN
2.0000 mg | Freq: Once | INTRAMUSCULAR | Status: AC
  Administered 2022-05-25: 2 mg via INTRAVENOUS
  Filled 2022-05-25: qty 1

## 2022-05-25 MED ORDER — FENTANYL CITRATE PF 50 MCG/ML IJ SOSY
50.0000 ug | PREFILLED_SYRINGE | Freq: Once | INTRAMUSCULAR | Status: AC
  Administered 2022-05-25: 50 ug via INTRAVENOUS
  Filled 2022-05-25: qty 1

## 2022-05-25 MED ORDER — LACTATED RINGERS IV BOLUS
2000.0000 mL | Freq: Once | INTRAVENOUS | Status: AC
  Administered 2022-05-25: 2000 mL via INTRAVENOUS

## 2022-05-25 NOTE — ED Notes (Signed)
Pt transported to CT on cardiac monitoring, accompanied by charge RN.

## 2022-05-25 NOTE — ED Notes (Signed)
Pt restless, hyperventilating. C/o LUQ pain 10/10.  EDP made aware.

## 2022-05-25 NOTE — ED Triage Notes (Signed)
LLQ pain that started 30 min PTA Pain intermittent since last week in june CT scan last week for same pain with negative result

## 2022-05-26 ENCOUNTER — Encounter (HOSPITAL_BASED_OUTPATIENT_CLINIC_OR_DEPARTMENT_OTHER): Payer: Self-pay

## 2022-05-26 ENCOUNTER — Encounter (HOSPITAL_COMMUNITY): Payer: Self-pay

## 2022-05-26 DIAGNOSIS — Z9884 Bariatric surgery status: Secondary | ICD-10-CM | POA: Diagnosis not present

## 2022-05-26 DIAGNOSIS — D62 Acute posthemorrhagic anemia: Secondary | ICD-10-CM

## 2022-05-26 DIAGNOSIS — Z888 Allergy status to other drugs, medicaments and biological substances status: Secondary | ICD-10-CM | POA: Diagnosis not present

## 2022-05-26 DIAGNOSIS — Z79899 Other long term (current) drug therapy: Secondary | ICD-10-CM | POA: Diagnosis not present

## 2022-05-26 DIAGNOSIS — F418 Other specified anxiety disorders: Secondary | ICD-10-CM | POA: Diagnosis not present

## 2022-05-26 DIAGNOSIS — K254 Chronic or unspecified gastric ulcer with hemorrhage: Secondary | ICD-10-CM | POA: Diagnosis not present

## 2022-05-26 DIAGNOSIS — F32A Depression, unspecified: Secondary | ICD-10-CM | POA: Diagnosis present

## 2022-05-26 DIAGNOSIS — K920 Hematemesis: Secondary | ICD-10-CM | POA: Diagnosis present

## 2022-05-26 DIAGNOSIS — K922 Gastrointestinal hemorrhage, unspecified: Secondary | ICD-10-CM | POA: Diagnosis present

## 2022-05-26 DIAGNOSIS — Z887 Allergy status to serum and vaccine status: Secondary | ICD-10-CM | POA: Diagnosis not present

## 2022-05-26 DIAGNOSIS — Z8249 Family history of ischemic heart disease and other diseases of the circulatory system: Secondary | ICD-10-CM | POA: Diagnosis not present

## 2022-05-26 DIAGNOSIS — Z818 Family history of other mental and behavioral disorders: Secondary | ICD-10-CM | POA: Diagnosis not present

## 2022-05-26 DIAGNOSIS — Z6836 Body mass index (BMI) 36.0-36.9, adult: Secondary | ICD-10-CM | POA: Diagnosis not present

## 2022-05-26 DIAGNOSIS — F419 Anxiety disorder, unspecified: Secondary | ICD-10-CM | POA: Diagnosis present

## 2022-05-26 DIAGNOSIS — Z880 Allergy status to penicillin: Secondary | ICD-10-CM | POA: Diagnosis not present

## 2022-05-26 DIAGNOSIS — D75839 Thrombocytosis, unspecified: Secondary | ICD-10-CM | POA: Diagnosis present

## 2022-05-26 DIAGNOSIS — E669 Obesity, unspecified: Secondary | ICD-10-CM | POA: Diagnosis present

## 2022-05-26 DIAGNOSIS — I10 Essential (primary) hypertension: Secondary | ICD-10-CM | POA: Diagnosis present

## 2022-05-26 DIAGNOSIS — Z885 Allergy status to narcotic agent status: Secondary | ICD-10-CM | POA: Diagnosis not present

## 2022-05-26 LAB — CBC
HCT: 25 % — ABNORMAL LOW (ref 36.0–46.0)
Hemoglobin: 7.4 g/dL — ABNORMAL LOW (ref 12.0–15.0)
MCH: 25.5 pg — ABNORMAL LOW (ref 26.0–34.0)
MCHC: 29.6 g/dL — ABNORMAL LOW (ref 30.0–36.0)
MCV: 86.2 fL (ref 80.0–100.0)
Platelets: 396 10*3/uL (ref 150–400)
RBC: 2.9 MIL/uL — ABNORMAL LOW (ref 3.87–5.11)
RDW: 16.9 % — ABNORMAL HIGH (ref 11.5–15.5)
WBC: 12.8 10*3/uL — ABNORMAL HIGH (ref 4.0–10.5)
nRBC: 0 % (ref 0.0–0.2)

## 2022-05-26 LAB — PROTIME-INR
INR: 1.1 (ref 0.8–1.2)
Prothrombin Time: 13.6 seconds (ref 11.4–15.2)

## 2022-05-26 LAB — COMPREHENSIVE METABOLIC PANEL
ALT: 18 U/L (ref 0–44)
AST: 17 U/L (ref 15–41)
Albumin: 3.5 g/dL (ref 3.5–5.0)
Alkaline Phosphatase: 69 U/L (ref 38–126)
Anion gap: 8 (ref 5–15)
BUN: 23 mg/dL — ABNORMAL HIGH (ref 6–20)
CO2: 24 mmol/L (ref 22–32)
Calcium: 8.4 mg/dL — ABNORMAL LOW (ref 8.9–10.3)
Chloride: 107 mmol/L (ref 98–111)
Creatinine, Ser: 0.59 mg/dL (ref 0.44–1.00)
GFR, Estimated: >60 mL/min (ref >60)
Glucose, Bld: 151 mg/dL — ABNORMAL HIGH (ref 70–99)
Potassium: 3.5 mmol/L (ref 3.5–5.1)
Sodium: 139 mmol/L (ref 135–145)
Total Bilirubin: 0.3 mg/dL (ref 0.3–1.2)
Total Protein: 6.5 g/dL (ref 6.5–8.1)

## 2022-05-26 LAB — URINALYSIS, ROUTINE W REFLEX MICROSCOPIC
Bilirubin Urine: NEGATIVE
Glucose, UA: NEGATIVE mg/dL
Hgb urine dipstick: NEGATIVE
Ketones, ur: 15 mg/dL — AB
Leukocytes,Ua: NEGATIVE
Nitrite: NEGATIVE
Protein, ur: NEGATIVE mg/dL
Specific Gravity, Urine: 1.015 (ref 1.005–1.030)
pH: 6 (ref 5.0–8.0)

## 2022-05-26 LAB — GLUCOSE, CAPILLARY: Glucose-Capillary: 136 mg/dL — ABNORMAL HIGH (ref 70–99)

## 2022-05-26 LAB — HEMOGLOBIN AND HEMATOCRIT, BLOOD
HCT: 25 % — ABNORMAL LOW (ref 36.0–46.0)
HCT: 24.9 % — ABNORMAL LOW (ref 36.0–46.0)
HCT: 25.9 % — ABNORMAL LOW (ref 36.0–46.0)
Hemoglobin: 7.4 g/dL — ABNORMAL LOW (ref 12.0–15.0)
Hemoglobin: 7.4 g/dL — ABNORMAL LOW (ref 12.0–15.0)
Hemoglobin: 7.5 g/dL — ABNORMAL LOW (ref 12.0–15.0)

## 2022-05-26 LAB — PREGNANCY, URINE: Preg Test, Ur: NEGATIVE

## 2022-05-26 LAB — OCCULT BLOOD X 1 CARD TO LAB, STOOL: Fecal Occult Bld: NEGATIVE

## 2022-05-26 LAB — ABO/RH: ABO/RH(D): A POS

## 2022-05-26 LAB — HIV ANTIBODY (ROUTINE TESTING W REFLEX): HIV Screen 4th Generation wRfx: NONREACTIVE

## 2022-05-26 LAB — MAGNESIUM: Magnesium: 2 mg/dL (ref 1.7–2.4)

## 2022-05-26 LAB — PHOSPHORUS: Phosphorus: 3.8 mg/dL (ref 2.5–4.6)

## 2022-05-26 MED ORDER — PANTOPRAZOLE SODIUM 40 MG IV SOLR: 40.0000 mg | Freq: Two times a day (BID) | INTRAVENOUS | Status: DC

## 2022-05-26 MED ORDER — FENTANYL CITRATE PF 50 MCG/ML IJ SOSY
50.0000 ug | PREFILLED_SYRINGE | Freq: Once | INTRAMUSCULAR | Status: AC
  Administered 2022-05-26: 50 ug via INTRAVENOUS
  Filled 2022-05-26: qty 1

## 2022-05-26 MED ORDER — HALOPERIDOL LACTATE 5 MG/ML IJ SOLN
2.0000 mg | Freq: Once | INTRAMUSCULAR | Status: AC
  Administered 2022-05-26: 2 mg via INTRAVENOUS
  Filled 2022-05-26: qty 1

## 2022-05-26 MED ORDER — HYDROMORPHONE HCL 1 MG/ML IJ SOLN: 0.5000 mg | INTRAMUSCULAR | Status: DC | PRN

## 2022-05-26 MED ORDER — PANTOPRAZOLE INFUSION (NEW) - SIMPLE MED
8.0000 mg/h | INTRAVENOUS | Status: DC
  Administered 2022-05-26 – 2022-05-28 (×4): 8 mg/h via INTRAVENOUS
  Filled 2022-05-26 (×2): qty 100
  Filled 2022-05-26 (×4): qty 80
  Filled 2022-05-26: qty 100

## 2022-05-26 MED ORDER — PROCHLORPERAZINE EDISYLATE 10 MG/2ML IJ SOLN
10.0000 mg | Freq: Four times a day (QID) | INTRAMUSCULAR | Status: DC | PRN
  Administered 2022-05-26 (×2): 10 mg via INTRAVENOUS
  Filled 2022-05-26 (×2): qty 2

## 2022-05-26 MED ORDER — METOCLOPRAMIDE HCL 5 MG/ML IJ SOLN: 10.0000 mg | INTRAMUSCULAR | Status: DC

## 2022-05-26 MED ORDER — OXYCODONE HCL 5 MG PO TABS
5.0000 mg | ORAL_TABLET | Freq: Four times a day (QID) | ORAL | Status: DC | PRN
  Administered 2022-05-26: 5 mg via ORAL
  Filled 2022-05-26: qty 1

## 2022-05-26 MED ORDER — HYDROMORPHONE HCL 2 MG/ML IJ SOLN
1.0000 mg | INTRAMUSCULAR | Status: AC
  Administered 2022-05-26: 1 mg via INTRAVENOUS
  Filled 2022-05-26: qty 1

## 2022-05-26 MED ORDER — PANTOPRAZOLE 80MG IVPB - SIMPLE MED
80.0000 mg | Freq: Once | INTRAVENOUS | Status: AC
  Administered 2022-05-26: 80 mg via INTRAVENOUS
  Filled 2022-05-26: qty 100
  Filled 2022-05-26: qty 80

## 2022-05-26 MED ORDER — SENNOSIDES-DOCUSATE SODIUM 8.6-50 MG PO TABS
1.0000 | ORAL_TABLET | Freq: Every day | ORAL | Status: DC
  Administered 2022-05-26 – 2022-05-27 (×2): 1 via ORAL
  Filled 2022-05-26 (×2): qty 1

## 2022-05-26 MED ORDER — DULOXETINE HCL 60 MG PO CPEP
60.0000 mg | ORAL_CAPSULE | Freq: Every day | ORAL | Status: DC
  Administered 2022-05-26 – 2022-05-28 (×3): 60 mg via ORAL
  Filled 2022-05-26: qty 2
  Filled 2022-05-26 (×2): qty 1

## 2022-05-26 MED ORDER — MELATONIN 5 MG PO TABS: 5.0000 mg | ORAL_TABLET | Freq: Every evening | ORAL | Status: DC | PRN

## 2022-05-26 MED ORDER — POLYETHYLENE GLYCOL 3350 17 G PO PACK: 17.0000 g | PACK | Freq: Every day | ORAL | Status: DC | PRN

## 2022-05-26 MED ORDER — IOHEXOL 300 MG/ML  SOLN
100.0000 mL | Freq: Once | INTRAMUSCULAR | Status: AC | PRN
  Administered 2022-05-26: 100 mL via INTRAVENOUS

## 2022-05-26 MED ORDER — HYDROMORPHONE HCL 2 MG/ML IJ SOLN
0.5000 mg | INTRAMUSCULAR | Status: DC | PRN
  Administered 2022-05-26: 0.5 mg via INTRAVENOUS
  Filled 2022-05-26: qty 1

## 2022-05-26 MED ORDER — ACETAMINOPHEN 325 MG PO TABS
650.0000 mg | ORAL_TABLET | Freq: Four times a day (QID) | ORAL | Status: DC | PRN
  Administered 2022-05-27: 650 mg via ORAL
  Filled 2022-05-26: qty 2

## 2022-05-26 NOTE — Consult Note (Addendum)
Consultation Note   Referring Provider: [facility] PCP: Christen Butter, NP Primary Gastroenterologist: Doristine Locks, MD in 2020 but has established care with [facility] following recent admission. She plans to follow up with that practice.  Reason for consultation: Abdominal pain, ? GI bleed  Hospital Day: 2  Assessment / Plan   # 56 yo female with history of gastric bypass ( ? Sleeve or Roux-en-Y) recently admitted to [facility] with LUQ pain and upper GI bleed ( oozing from stomach apparently but I cannot locate report in Care Everywhere).  Now with acute recurrence of LUQ pain and brown emesis but hgb relatively stable since last admission ( 8.1 >> 7.4). CT scan with large amount of blood in stomach and duodenum.  Clear liquids, NPO after MN Turns out that she will be seen this admission by Dr. Loreta Ave. She will need an EGD tomorrow. Dr. Loreta Ave will see her prior to the procedure to get consent.  Continue PPI infusion Will order dose of Reglan to be give on call to procedure to clear blood from stomach Continue to monitor Hgb which has remained stable today at 7.4.   .  # Negative screening colonoscopy in Aug 2020  See PMH for additional medical problems   HPI   Samyria Rudie is a 56 y.o. female with a past medical history significant for pseudotumor cerebri, depression, roux-en-Y in 2015.  See PMH for any additional medical problems.  Patient was seen in our office in 2020 for GERD symptoms and IDA felt to be Care Everywhere. Patient had an appointment with [facility] on 05/09/22. It was a hospital follow up. Per that note: The patient was admitted to the hospital in 02/2022 with CT scan showing soft tissue stranding and trace free intraperitoneal fluid around the gastrojejunostomy site. She was started on PPI twice daily and Carafate. Given the evidence of possible perforation, EGD was deferred and she was advised to follow-up in the  outpatient setting.  She then presented back to the hospital in 04/2022 with GI bleeding and abdominal pain. An EGD was completed and showed diffuse oozing in the gastric body with erythema along with salmon-colored mucosa. Biopsies were negative for Barrett's esophagus. Gastrin was 18. Hemoglobin remained stable and the patient was discharged with outpatient follow-up  Interval History:  Patient presented to [facility]  ED yesterday with LUQ pain ( recurrent). The pain radiates through to her back. It is a constant sharp pain. She has associated vomiting of dark emesis though she says it doesn't appear to be blood. No melena. No blood in stools. This is the same exact pain as when she was admitted to [facility] as couple of weeks ago at which time an EGD showed oozing in gastric body. actually in the probably will be hers and ED yesterday with LUQ pain. CT scan this admission shows what appears to be blood in stomach and proximal duodenum.She doesn't take NSAIDs. She has been compliant with PPI and Carafate. She had actually gotten better after recent admission and then the pain started again last night  Hgb was 8.1 on 05/02/22. It is currently 7.4.   Previous GI Evaluation     August 2020 Colonoscopy  - Adequate prep - The entire examined colon is normal. - The distal rectum and anal verge are normal on retroflexion  view. - The examined portion of the ileum was normal. - No specimens collected.  August 2020 EGD - Ectopic gastric mucosa in the upper third of the esophagus. - Normal esophagus. Dilated. - Z-line regular, 37 cm from the incisors. - A gastrojejunostomy and sleeve gastrectomy was found, characterized by healthy appearing mucosa and visible sutures. - Erythematous mucosa in the gastric body. Biopsied.  Recent Labs and Imaging CT ABDOMEN PELVIS W CONTRAST  Result Date: 05/26/2022 CLINICAL DATA:  Gastric bypass surgery on April 30, 2022, presenting with intermittent left lower quadrant pain.  EXAM: CT ABDOMEN AND PELVIS WITH CONTRAST TECHNIQUE: Multidetector CT imaging of the abdomen and pelvis was performed using the standard protocol following bolus administration of intravenous contrast. RADIATION DOSE REDUCTION: This exam was performed according to the departmental dose-optimization program which includes automated exposure control, adjustment of the mA and/or kV according to patient size and/or use of iterative reconstruction technique. CONTRAST:  OMNIPAQUE IOHEXOL 300 MG/ML  SOLN COMPARISON:  None Available. FINDINGS: Lower chest: No acute abnormality. Hepatobiliary: No focal liver abnormality is seen. No gallstones, gallbladder wall thickening, or biliary dilatation. Pancreas: Unremarkable. No pancreatic ductal dilatation or surrounding inflammatory changes. Spleen: Normal in size without focal abnormality. Adrenals/Urinary Tract: Adrenal glands are unremarkable. Kidneys are normal, without renal calculi, focal lesion, or hydronephrosis. Bladder is unremarkable. Stomach/Bowel: Multiple surgical sutures are seen within the gastric region. Surgically anastomosed bowel is also seen within the anterior aspect of the mid to upper left abdomen. 6.5 cm x 3.5 cm x 4.5 cm and 8.8 cm x 4.7 cm x 3.4 cm areas of ill-defined heterogeneous increased attenuation (approximately 52.62 Hounsfield units) are seen within the lumen of the gastric body and gastric antrum. Extension into the duodenal bulb is noted. A similar appearing 2.5 cm x 4.1 cm x 3.8 cm area of increased attenuation (approximately 48.00 Hounsfield units) is noted within the lumen of the proximal duodenum. Appendix appears normal. No evidence of bowel wall thickening, distention, or inflammatory changes. Vascular/Lymphatic: Aortic atherosclerosis. No enlarged abdominal or pelvic lymph nodes. Reproductive: Uterus and bilateral adnexa are unremarkable. Other: No abdominal wall hernia or abnormality. No abdominopelvic ascites. Musculoskeletal:  No acute or significant osseous findings. IMPRESSION: 1. Findings consistent with prior gastric bypass surgery with a large amount of evolving blood products seen within the stomach and proximal duodenum. Follow-up with endoscopy is recommended to exclude the presence of an underlying neoplastic process. 2. Aortic atherosclerosis. Aortic Atherosclerosis (ICD10-I70.0). Electronically Signed   By: Aram Candela M.D.   On: 05/26/2022 00:18    Labs:  Recent Labs    05/25/22 2243 05/26/22 0340 05/26/22 0932 05/26/22 1546  WBC 12.8* 12.8*  --   --   HGB 8.2* 7.4* 7.4* 7.4*  HCT 26.5* 25.0* 25.0* 24.9*  PLT 484* 396  --   --    Recent Labs    05/25/22 2243 05/26/22 0340  NA 140 139  K 3.9 3.5  CL 108 107  CO2 21* 24  GLUCOSE 102* 151*  BUN 24* 23*  CREATININE 0.79 0.59  CALCIUM 9.3 8.4*   Recent Labs    05/26/22 0340  PROT 6.5  ALBUMIN 3.5  AST 17  ALT 18  ALKPHOS 69  BILITOT 0.3   No results for input(s): "HEPBSAG", "HCVAB", "HEPAIGM", "HEPBIGM" in the last 72 hours. 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   Microcytic anemia 08/11/2017   Obesity    Secondary hyperparathyroidism, non-renal (HCC) 03/13/2019   Vitamin A deficiency 03/13/2019    Past Surgical History:  Procedure Laterality Date   ABLATION     GASTRIC BYPASS     SHOULDER ARTHROSCOPY     TUMOR REMOVAL     right hand    Family History  Problem Relation Age of Onset   Hypertension Mother    Depression Mother    Cancer Other        skin   Hypertension Father    Dementia Father    Bipolar disorder Sister    Melanoma Maternal Grandmother    Colon cancer Neg Hx    Esophageal cancer Neg Hx     Prior to Admission  medications   Medication Sig Start Date End Date Taking? Authorizing Provider  DULoxetine (CYMBALTA) 60 MG capsule TAKE 1 CAPSULE BY MOUTH EVERY DAY Patient taking differently: Take 60 mg by mouth daily. 04/17/22  Yes Christen Butter, NP  ondansetron (ZOFRAN-ODT) 4 MG disintegrating tablet Take 4 mg by mouth every 8 (eight) hours as needed. 04/21/22  Yes [provider]  pantoprazole (PROTONIX) 40 MG tablet Take 40 mg by mouth daily. 05/02/22  Yes [provider]  sucralfate (CARAFATE) 1 GM/10ML suspension Take 1 g by mouth 3 (three) times daily. 03/26/22  Yes [provider]  traMADol (ULTRAM) 50 MG tablet Take 50 mg by mouth every 6 (six) hours as needed for moderate pain. Patient not taking: Reported on 05/26/2022 04/21/22   [provider]    Current Facility-Administered Medications  Medication Dose Route Frequency Provider Last Rate Last Admin   acetaminophen (TYLENOL) tablet 650 mg  650 mg Oral Q6H PRN Dow Adolph N, DO       DULoxetine (CYMBALTA) DR capsule 60 mg  60 mg Oral Daily Dow Adolph N, DO   60 mg at 05/26/22 1054   HYDROmorphone (DILAUDID) injection 0.5 mg  0.5 mg Intravenous Q4H PRN Glade Lloyd, MD       melatonin tablet 5 mg  5 mg Oral QHS PRN Dow Adolph N, DO       oxyCODONE (Oxy IR/ROXICODONE) immediate release tablet 5 mg  5 mg Oral Q6H PRN Dow Adolph N, DO   5 mg at 05/26/22 1541   [START ON 05/29/2022] pantoprazole (PROTONIX) injection 40 mg  40 mg Intravenous Q12H Palumbo, April, MD       pantoprozole (PROTONIX) 80 mg /NS 100 mL infusion  8 mg/hr Intravenous Continuous Palumbo, April, MD 10 mL/hr at 05/26/22 1600 8 mg/hr at 05/26/22 1600   polyethylene glycol (MIRALAX / GLYCOLAX) packet 17 g  17 g Oral Daily PRN Dow Adolph N, DO       prochlorperazine (COMPAZINE) injection 10 mg  10 mg Intravenous Q6H PRN Dow Adolph N, DO   10 mg at 05/26/22 1541   senna-docusate (Senokot-S) tablet 1 tablet  1 tablet Oral QHS Dow Adolph N, DO         Allergies as of 05/25/2022 - Review Complete 05/25/2022  Allergen Reaction Noted   Shingrix [zoster vac recomb adjuvanted] Nausea And Vomiting 05/15/2021   Belsomra [suvorexant] Other (See Comments) 09/17/2017   Codeine Nausea Only 08/31/2010  Penicillins Rash 08/31/2010    Social History   Socioeconomic History   Marital status: Married    Spouse name: Not on file   Number of children: 2   Years of education: Not on file   Highest education level: Not on file  Occupational History   Occupation: Human resources  Tobacco Use   Smoking status: Never   Smokeless tobacco: Never  Vaping Use   Vaping Use: Never used  Substance and Sexual Activity   Alcohol use: No   Drug use: No   Sexual activity: Yes    Birth control/protection: Surgical    Comment: vasectomy  Other Topics Concern   Not on file  Social History Narrative   Not on file   Social Determinants of Health   Financial Resource Strain: Not on file  Food Insecurity: Not on file  Transportation Needs: Not on file  Physical Activity: Not on file  Stress: Not on file  Social Connections: Not on file  Intimate Partner Violence: Not on file    Review of Systems: All systems reviewed and negative except where noted in HPI.  Physical Exam: Vital signs in last 24 hours: Temp:  [97.8 F (36.6 C)-99 F (37.2 C)] 98.9 F (37.2 C) (08/28 1522) Pulse Rate:  [81-121] 81 (08/28 1524) Resp:  [13-25] 20 (08/28 1524) BP: (140-183)/(74-104) 144/79 (08/28 1524) SpO2:  [91 %-100 %] 96 % (08/28 1524) Weight:  [95.3 kg-97 kg] 97 kg (08/28 1524) Last BM Date : 05/25/22  General:  Alert female in NAD Psych:  Pleasant, cooperative. Normal mood and affect Eyes: Pupils equal, no icterus. Conjunctive pink Ears:  Normal auditory acuity Nose: No deformity, discharge or lesions Neck:  Supple, no masses felt Lungs:  Clear to auscultation.  Heart:  Regular rate, regular rhythm. No lower extremity edema Abdomen:  Soft,  nondistended, nontender, active bowel sounds, no masses felt Rectal :  Deferred Msk: Symmetrical without gross deformities.  Neurologic:  Alert, oriented, grossly normal neurologically Skin:  Intact without significant lesions.    Intake/Output from previous day: 08/27 0701 - 08/28 0700 In: 2000 [IV Piggyback:2000] Out: -  Intake/Output this shift:  Total I/O In: 100 [IV Piggyback:100] Out: -     Principal Problem:   GI bleed Active Problems:   Obesity (BMI 30-39.9)   ABLA (acute blood loss anemia)   Anxiety and depression    Willette Cluster, NP-C @  05/26/2022, 4:44 PM   Attending physician's note   I have taken history, reviewed the chart and examined the patient. I performed a substantive portion of this encounter, including complete performance of at least one of the key components, in conjunction with the APP. I agree with the Advanced Practitioner's note, impression and recommendations.   Unassigned patient (X- cover for Mann/[facility])  LUQ abdo pain in pt with H/O gastric sleeve 2015 N/V with brown hematemesis. CT showing blood in the stomach/duodenum. Hb 8 to 7.4 (baseline 12.6 12/2021). No melena. Had similar presentation 04/2022 @ [facility], s/p EGD which showed diffuse oozing. No NSAIDs. Nl gastrin.  Plan: -EGD in AM with Dr Loreta Ave (she is aware) -Give 10 mg of Reglan IV on-call to Endo unit. -Trend CBC. Keep Hb>7. -IV Protonix -Avoid nonsteroidals -Resume Carafate depending upon EGD tomorrow -Outpt FU with Dr Edwin Dada ([facility]) -Also, outpt FH with Dr Myna Hidalgo (hematology)   Edman Circle, MD [facility] [phone]

## 2022-05-26 NOTE — ED Notes (Signed)
Received pt from [facility]. RN will wait for new orders.

## 2022-05-26 NOTE — Progress Notes (Signed)
Patient admitted early this morning for severe abdominal pain and possible upper GI bleed and started on IV Protonix.  ED provider has consulted GI.  I have seen and examined the patient at bedside and plan of care discussed with her.  Continue H&H monitoring.  Follow GI recommendations.  Continue n.p.o. for now.  I have reviewed patient's medical records including this morning's H&P, current vitals, and labs and medications myself.

## 2022-05-26 NOTE — H&P (Signed)
History and Physical  Meagan Floyd SWN:[id number] DOB: Mar 01, 1966 DOA: 05/25/2022  Referring physician: Dr. Read Drivers, EDP  PCP: Christen Butter, NP  Outpatient Specialists: GI, general surgery. Patient coming from: Home.  Chief Complaint: Severe abdominal pain.  HPI: Meagan Floyd is a 56 y.o. female with medical history significant for remote gastric bypass, GERD, chronic anxiety/depression, recently admitted at [facility] on 04/30/2022 for the same, had an EGD which was negative.  Denies use of NSAIDs.  Now presenting to [facility] ED with left upper quadrant abdominal pain.  Contrast enhanced CT scan shows blood content in stomach and proximal duodenum.  EDP discussed the case with GI and general surgery.  The patient was started on Protonix drip and transferred ED to ED to [facility].  Made n.p.o. after midnight for possible EGD by GI, Dr. Elnoria Howard.  Admitted by hospitalist service, [facility].  ED Course: Tmax 98.  BP 180/96, pulse 77, respiration rate 21, O2 saturation 93% on room air.  Lab studies remarkable for serum glucose 151, BUN 23, creatinine 0.59.  Hemoglobin 8.2, WBC 12.8, platelet 484.  Review of Systems: Review of systems as noted in the HPI. All other systems reviewed and are negative.   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 She has never used smokeless tobacco. She reports that she does not drink alcohol and does not use  drugs.   Allergies  Allergen Reactions   Shingrix [Zoster Vac Recomb Adjuvanted] Nausea And Vomiting    High fever   Belsomra [Suvorexant] Other (See Comments)    paresthesias   Codeine Nausea Only   Penicillins Rash    Family History  Problem Relation Age of Onset   Hypertension Mother    Depression Mother    Cancer Other        skin   Hypertension Father    Dementia Father    Bipolar disorder Sister    Melanoma Maternal Grandmother    Colon cancer Neg Hx    Esophageal cancer Neg Hx       Prior to Admission medications   Medication Sig Start Date End Date Taking? Authorizing Provider  DULoxetine (CYMBALTA) 60 MG capsule TAKE 1 CAPSULE BY MOUTH EVERY DAY Patient taking differently: Take 60 mg by mouth daily. 04/17/22  Yes Christen Butter, NP  ondansetron (ZOFRAN-ODT) 4 MG disintegrating tablet Take 4 mg by mouth every 8 (eight) hours as needed. 04/21/22  Yes [provider]  pantoprazole (PROTONIX) 40 MG tablet Take 40 mg by mouth daily. 05/02/22  Yes [provider]  sucralfate (CARAFATE) 1 GM/10ML suspension Take 1 g by mouth 3 (three) times daily. 03/26/22  Yes [provider]  traMADol (ULTRAM) 50 MG tablet Take 50 mg by mouth every 6 (six) hours as needed for moderate pain. Patient not taking: Reported on 05/26/2022 04/21/22  [provider]    Physical Exam: BP (!) 183/104   Pulse 86   Temp 98 F (36.7 C) (Oral)   Resp (!) 21   Ht 5\' 4"  (1.626 m)   Wt 95.3 kg   SpO2 96%   BMI 36.05 kg/m   General: 56 y.o. year-old female well developed well nourished in no acute distress.  Alert and oriented x3.  Uncomfortable due to abdominal pain. Cardiovascular: Regular rate and rhythm with no rubs or gallops.  No thyromegaly or JVD noted.  No lower extremity edema. 2/4 pulses in all 4 extremities. Respiratory: Clear to auscultation with no wheezes or rales. Good inspiratory effort. Abdomen: Soft tender nondistended with normal bowel sounds x4  quadrants. Muskuloskeletal: No cyanosis, clubbing or edema noted bilaterally Neuro: CN II-XII intact, strength, sensation, reflexes Skin: No ulcerative lesions noted or rashes Psychiatry: Judgement and insight appear normal. Mood is appropriate for condition and setting          Labs on Admission:  Basic Metabolic Panel: Recent Labs  Lab 05/25/22 2243  NA 140  K 3.9  CL 108  CO2 21*  GLUCOSE 102*  BUN 24*  CREATININE 0.79  CALCIUM 9.3   Liver Function Tests: Recent Labs  Lab 05/25/22 2243  AST 24  ALT 18  ALKPHOS 76  BILITOT 0.3  PROT 7.0  ALBUMIN 3.7   Recent Labs  Lab 05/25/22 2243  LIPASE 28   No results for input(s): "AMMONIA" in the last 168 hours. CBC: Recent Labs  Lab 05/25/22 2243  WBC 12.8*  HGB 8.2*  HCT 26.5*  MCV 83.1  PLT 484*   Cardiac Enzymes: No results for input(s): "CKTOTAL", "CKMB", "CKMBINDEX", "TROPONINI" in the last 168 hours.  BNP (last 3 results) No results for input(s): "BNP" in the last 8760 hours.  ProBNP (last 3 results) No results for input(s): "PROBNP" in the last 8760 hours.  CBG: No results for input(s): "GLUCAP" in the last 168 hours.  Radiological Exams on Admission: CT ABDOMEN PELVIS W CONTRAST  Result Date: 05/26/2022 CLINICAL DATA:  Gastric bypass surgery on April 30, 2022, presenting with intermittent left lower quadrant pain. EXAM: CT ABDOMEN AND PELVIS WITH CONTRAST TECHNIQUE: Multidetector CT imaging of the abdomen and pelvis was performed using the standard protocol following bolus administration of intravenous contrast. RADIATION DOSE REDUCTION: This exam was performed according to the departmental dose-optimization program which includes automated exposure control, adjustment of the mA and/or kV according to patient size and/or use of iterative reconstruction technique. CONTRAST:  May 02, 2022 OMNIPAQUE IOHEXOL 300 MG/ML  SOLN COMPARISON:  None Available. FINDINGS: Lower chest: No acute abnormality. Hepatobiliary: No  focal liver abnormality is seen. No gallstones, gallbladder wall thickening, or biliary dilatation. Pancreas: Unremarkable. No pancreatic ductal dilatation or surrounding inflammatory changes. Spleen: Normal in size without focal abnormality. Adrenals/Urinary Tract: Adrenal glands are unremarkable. Kidneys are normal, without renal calculi, focal lesion, or hydronephrosis. Bladder is unremarkable. Stomach/Bowel: Multiple surgical sutures are seen within the gastric region. Surgically anastomosed bowel is also seen within the anterior aspect of the mid to upper left abdomen. 6.5 cm x 3.5 cm x 4.5 cm and 8.8 cm x 4.7 cm x 3.4 cm areas of ill-defined heterogeneous increased attenuation (approximately 52.62 Hounsfield units) are seen within the lumen of the gastric body and gastric antrum. Extension into the duodenal bulb is noted. A similar appearing 2.5 cm x 4.1 cm x 3.8 cm area of increased attenuation (approximately 48.00 Hounsfield units) is noted within the lumen  of the proximal duodenum. Appendix appears normal. No evidence of bowel wall thickening, distention, or inflammatory changes. Vascular/Lymphatic: Aortic atherosclerosis. No enlarged abdominal or pelvic lymph nodes. Reproductive: Uterus and bilateral adnexa are unremarkable. Other: No abdominal wall hernia or abnormality. No abdominopelvic ascites. Musculoskeletal: No acute or significant osseous findings. IMPRESSION: 1. Findings consistent with prior gastric bypass surgery with a large amount of evolving blood products seen within the stomach and proximal duodenum. Follow-up with endoscopy is recommended to exclude the presence of an underlying neoplastic process. 2. Aortic atherosclerosis. Aortic Atherosclerosis (ICD10-I70.0). Electronically Signed   By: Aram Candela M.D.   On: 05/26/2022 00:18    EKG: I independently viewed the EKG done and my findings are as followed: Sinus rhythm rate of 95.  Nonspecific ST-T changes.  QTc  452.  Assessment/Plan Present on Admission:  GI bleed  Principal Problem:   GI bleed  Upper GI bleed Presented with severe abdominal pain Recent EGD at [facility] which was negative Contrast enhanced CT scan showing blood content in stomach and proximal duodenum INR 1.1 on 05/26/2022. Started on Protonix drip in the ED, continue  Denies use of NSAIDs. N.p.o. until seen by GI GI, Dr. Elnoria Howard, consulted. Maintain MAP greater than 65. Pain control with bowel regimen  Acute blood loss anemia Presented with hemoglobin of 8.2 from 12.6, 4 months ago Repeat H&H and transfuse hemoglobin less than 8. Continue to monitor H&H  Obesity status post remote gastric bypass BMI 36 Recommend weight loss outpatient with regular physical activity and healthy dieting.  Hyperglycemia Serum glucose 151 Hemoglobin A1c 5.5 on 11/26/2021 Monitor repeat CBG  Chronic anxiety/depression Resume home duloxetine    DVT prophylaxis: SCDs.  Pharmacological DVT contraindicated in the setting of GI bleed.  Code Status: Full code  Family Communication: Updated her husband at bedside.  Disposition Plan: Admitted to stepdown unit  Consults called: GI consulted by EDP.  Admission status: Inpatient status.   Status is: Inpatient The patient requires at least 2 midnights for evaluation and treatment of present condition.   Darlin Drop MD [facility] Pager [phone]  If 7PM-7AM, please contact night-coverage [email or web address] Password [email or web address]  05/26/2022, 3:20 AM

## 2022-05-26 NOTE — H&P (View-Only) (Signed)
Consultation Note   Referring Provider: [facility] PCP: Christen Butter, NP Primary Gastroenterologist: Doristine Locks, MD in 2020 but has established care with [facility] following recent admission. She plans to follow up with that practice.  Reason for consultation: Abdominal pain, ? GI bleed  Hospital Day: 2  Assessment / Plan   # 56 yo female with history of gastric bypass ( ? Sleeve or Roux-en-Y) recently admitted to [facility] with LUQ pain and upper GI bleed ( oozing from stomach apparently but I cannot locate report in Care Everywhere).  Now with acute recurrence of LUQ pain and brown emesis but hgb relatively stable since last admission ( 8.1 >> 7.4). CT scan with large amount of blood in stomach and duodenum.  Clear liquids, NPO after MN Turns out that she will be seen this admission by Dr. Loreta Ave. She will need an EGD tomorrow. Dr. Loreta Ave will see her prior to the procedure to get consent.  Continue PPI infusion Will order dose of Reglan to be give on call to procedure to clear blood from stomach Continue to monitor Hgb which has remained stable today at 7.4.   .  # Negative screening colonoscopy in Aug 2020  See [facility] for additional medical problems   HPI   Meagan Floyd is a 56 y.o. female with a past medical history significant for pseudotumor cerebri, depression, roux-en-Y in 2015.  See [facility] for any additional medical problems.  Patient was seen in our office in 2020 for GERD symptoms and IDA felt to be Care Everywhere. Patient had an appointment with [facility] on 05/09/22. It was a hospital follow up. Per that note: The patient was admitted to the hospital in 02/2022 with CT scan showing soft tissue stranding and trace free intraperitoneal fluid around the gastrojejunostomy site. She was started on PPI twice daily and Carafate. Given the evidence of possible perforation, EGD was deferred and she was advised to follow-up in the  outpatient setting.  She then presented back to the hospital in 04/2022 with GI bleeding and abdominal pain. An EGD was completed and showed diffuse oozing in the gastric body with erythema along with salmon-colored mucosa. Biopsies were negative for Barrett's esophagus. Gastrin was 18. Hemoglobin remained stable and the patient was discharged with outpatient follow-up  Interval History:  Patient presented to [facility]  ED yesterday with LUQ pain ( recurrent). The pain radiates through to her back. It is a constant sharp pain. She has associated vomiting of dark emesis though she says it doesn't appear to be blood. No melena. No blood in stools. This is the same exact pain as when she was admitted to [facility] as couple of weeks ago at which time an EGD showed oozing in gastric body. actually in the probably will be hers and ED yesterday with LUQ pain. CT scan this admission shows what appears to be blood in stomach and proximal duodenum.She doesn't take NSAIDs. She has been compliant with PPI and Carafate. She had actually gotten better after recent admission and then the pain started again last night  Hgb was 8.1 on 05/02/22. It is currently 7.4.   Previous GI Evaluation     August 2020 Colonoscopy  - Adequate prep - The entire examined colon is normal. - The distal rectum and anal verge are normal on retroflexion  view. - The examined portion of the ileum was normal. - No specimens collected.  August 2020 EGD - Ectopic gastric mucosa in the upper third of the esophagus. - Normal esophagus. Dilated. - Z-line regular, 37 cm from the incisors. - A gastrojejunostomy and sleeve gastrectomy was found, characterized by healthy appearing mucosa and visible sutures. - Erythematous mucosa in the gastric body. Biopsied.  Recent Labs and Imaging CT ABDOMEN PELVIS W CONTRAST  Result Date: 05/26/2022 CLINICAL DATA:  Gastric bypass surgery on April 30, 2022, presenting with intermittent left lower quadrant pain.  EXAM: CT ABDOMEN AND PELVIS WITH CONTRAST TECHNIQUE: Multidetector CT imaging of the abdomen and pelvis was performed using the standard protocol following bolus administration of intravenous contrast. RADIATION DOSE REDUCTION: This exam was performed according to the departmental dose-optimization program which includes automated exposure control, adjustment of the mA and/or kV according to patient size and/or use of iterative reconstruction technique. CONTRAST:  OMNIPAQUE IOHEXOL 300 MG/ML  SOLN COMPARISON:  None Available. FINDINGS: Lower chest: No acute abnormality. Hepatobiliary: No focal liver abnormality is seen. No gallstones, gallbladder wall thickening, or biliary dilatation. Pancreas: Unremarkable. No pancreatic ductal dilatation or surrounding inflammatory changes. Spleen: Normal in size without focal abnormality. Adrenals/Urinary Tract: Adrenal glands are unremarkable. Kidneys are normal, without renal calculi, focal lesion, or hydronephrosis. Bladder is unremarkable. Stomach/Bowel: Multiple surgical sutures are seen within the gastric region. Surgically anastomosed bowel is also seen within the anterior aspect of the mid to upper left abdomen. 6.5 cm x 3.5 cm x 4.5 cm and 8.8 cm x 4.7 cm x 3.4 cm areas of ill-defined heterogeneous increased attenuation (approximately 52.62 Hounsfield units) are seen within the lumen of the gastric body and gastric antrum. Extension into the duodenal bulb is noted. A similar appearing 2.5 cm x 4.1 cm x 3.8 cm area of increased attenuation (approximately 48.00 Hounsfield units) is noted within the lumen of the proximal duodenum. Appendix appears normal. No evidence of bowel wall thickening, distention, or inflammatory changes. Vascular/Lymphatic: Aortic atherosclerosis. No enlarged abdominal or pelvic lymph nodes. Reproductive: Uterus and bilateral adnexa are unremarkable. Other: No abdominal wall hernia or abnormality. No abdominopelvic ascites. Musculoskeletal:  No acute or significant osseous findings. IMPRESSION: 1. Findings consistent with prior gastric bypass surgery with a large amount of evolving blood products seen within the stomach and proximal duodenum. Follow-up with endoscopy is recommended to exclude the presence of an underlying neoplastic process. 2. Aortic atherosclerosis. Aortic Atherosclerosis (ICD10-I70.0). Electronically Signed   By: Aram Candela M.D.   On: 05/26/2022 00:18    Labs:  Recent Labs    05/25/22 2243 05/26/22 0340 05/26/22 0932 05/26/22 1546  WBC 12.8* 12.8*  --   --   HGB 8.2* 7.4* 7.4* 7.4*  HCT 26.5* 25.0* 25.0* 24.9*  PLT 484* 396  --   --    Recent Labs    05/25/22 2243 05/26/22 0340  NA 140 139  K 3.9 3.5  CL 108 107  CO2 21* 24  GLUCOSE 102* 151*  BUN 24* 23*  CREATININE 0.79 0.59  CALCIUM 9.3 8.4*   Recent Labs    05/26/22 0340  PROT 6.5  ALBUMIN 3.5  AST 17  ALT 18  ALKPHOS 69  BILITOT 0.3   No results for input(s): "HEPBSAG", "HCVAB", "HEPAIGM", "HEPBIGM" in the last 72 hours. 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   Microcytic anemia 08/11/2017   Obesity    Secondary hyperparathyroidism, non-renal (HCC) 03/13/2019   Vitamin A deficiency 03/13/2019    Past Surgical History:  Procedure Laterality Date   ABLATION     GASTRIC BYPASS     SHOULDER ARTHROSCOPY     TUMOR REMOVAL     right hand    Family History  Problem Relation Age of Onset   Hypertension Mother    Depression Mother    Cancer Other        skin   Hypertension Father    Dementia Father    Bipolar disorder Sister    Melanoma Maternal Grandmother    Colon cancer Neg Hx    Esophageal cancer Neg Hx     Prior to Admission  medications   Medication Sig Start Date End Date Taking? Authorizing Provider  DULoxetine (CYMBALTA) 60 MG capsule TAKE 1 CAPSULE BY MOUTH EVERY DAY Patient taking differently: Take 60 mg by mouth daily. 04/17/22  Yes Christen Butter, NP  ondansetron (ZOFRAN-ODT) 4 MG disintegrating tablet Take 4 mg by mouth every 8 (eight) hours as needed. 04/21/22  Yes [provider]  pantoprazole (PROTONIX) 40 MG tablet Take 40 mg by mouth daily. 05/02/22  Yes [provider]  sucralfate (CARAFATE) 1 GM/10ML suspension Take 1 g by mouth 3 (three) times daily. 03/26/22  Yes [provider]  traMADol (ULTRAM) 50 MG tablet Take 50 mg by mouth every 6 (six) hours as needed for moderate pain. Patient not taking: Reported on 05/26/2022 04/21/22   [provider]    Current Facility-Administered Medications  Medication Dose Route Frequency Provider Last Rate Last Admin   acetaminophen (TYLENOL) tablet 650 mg  650 mg Oral Q6H PRN Dow Adolph N, DO       DULoxetine (CYMBALTA) DR capsule 60 mg  60 mg Oral Daily Dow Adolph N, DO   60 mg at 05/26/22 1054   HYDROmorphone (DILAUDID) injection 0.5 mg  0.5 mg Intravenous Q4H PRN Glade Lloyd, MD       melatonin tablet 5 mg  5 mg Oral QHS PRN Dow Adolph N, DO       oxyCODONE (Oxy IR/ROXICODONE) immediate release tablet 5 mg  5 mg Oral Q6H PRN Dow Adolph N, DO   5 mg at 05/26/22 1541   [START ON 05/29/2022] pantoprazole (PROTONIX) injection 40 mg  40 mg Intravenous Q12H Palumbo, April, MD       pantoprozole (PROTONIX) 80 mg /NS 100 mL infusion  8 mg/hr Intravenous Continuous Palumbo, April, MD 10 mL/hr at 05/26/22 1600 8 mg/hr at 05/26/22 1600   polyethylene glycol (MIRALAX / GLYCOLAX) packet 17 g  17 g Oral Daily PRN Dow Adolph N, DO       prochlorperazine (COMPAZINE) injection 10 mg  10 mg Intravenous Q6H PRN Dow Adolph N, DO   10 mg at 05/26/22 1541   senna-docusate (Senokot-S) tablet 1 tablet  1 tablet Oral QHS Dow Adolph N, DO         Allergies as of 05/25/2022 - Review Complete 05/25/2022  Allergen Reaction Noted   Shingrix [zoster vac recomb adjuvanted] Nausea And Vomiting 05/15/2021   Belsomra [suvorexant] Other (See Comments) 09/17/2017   Codeine Nausea Only 08/31/2010  Penicillins Rash 08/31/2010    Social History   Socioeconomic History   Marital status: Married    Spouse name: Not on file   Number of children: 2   Years of education: Not on file   Highest education level: Not on file  Occupational History   Occupation: Human resources  Tobacco Use   Smoking status: Never   Smokeless tobacco: Never  Vaping Use   Vaping Use: Never used  Substance and Sexual Activity   Alcohol use: No   Drug use: No   Sexual activity: Yes    Birth control/protection: Surgical    Comment: vasectomy  Other Topics Concern   Not on file  Social History Narrative   Not on file   Social Determinants of Health   Financial Resource Strain: Not on file  Food Insecurity: Not on file  Transportation Needs: Not on file  Physical Activity: Not on file  Stress: Not on file  Social Connections: Not on file  Intimate Partner Violence: Not on file    Review of Systems: All systems reviewed and negative except where noted in HPI.  Physical Exam: Vital signs in last 24 hours: Temp:  [97.8 F (36.6 C)-99 F (37.2 C)] 98.9 F (37.2 C) (08/28 1522) Pulse Rate:  [81-121] 81 (08/28 1524) Resp:  [13-25] 20 (08/28 1524) BP: (140-183)/(74-104) 144/79 (08/28 1524) SpO2:  [91 %-100 %] 96 % (08/28 1524) Weight:  [95.3 kg-97 kg] 97 kg (08/28 1524) Last BM Date : 05/25/22  General:  Alert female in NAD Psych:  Pleasant, cooperative. Normal mood and affect Eyes: Pupils equal, no icterus. Conjunctive pink Ears:  Normal auditory acuity Nose: No deformity, discharge or lesions Neck:  Supple, no masses felt Lungs:  Clear to auscultation.  Heart:  Regular rate, regular rhythm. No lower extremity edema Abdomen:  Soft,  nondistended, nontender, active bowel sounds, no masses felt Rectal :  Deferred Msk: Symmetrical without gross deformities.  Neurologic:  Alert, oriented, grossly normal neurologically Skin:  Intact without significant lesions.    Intake/Output from previous day: 08/27 0701 - 08/28 0700 In: 2000 [IV Piggyback:2000] Out: -  Intake/Output this shift:  Total I/O In: 100 [IV Piggyback:100] Out: -     Principal Problem:   GI bleed Active Problems:   Obesity (BMI 30-39.9)   ABLA (acute blood loss anemia)   Anxiety and depression    Willette Cluster, NP-C @  05/26/2022, 4:44 PM   Attending physician's note   I have taken history, reviewed the chart and examined the patient. I performed a substantive portion of this encounter, including complete performance of at least one of the key components, in conjunction with the APP. I agree with the Advanced Practitioner's note, impression and recommendations.   Unassigned patient (X- cover for [facility])  LUQ abdo pain in pt with H/O gastric sleeve 2015 N/V with brown hematemesis. CT showing blood in the stomach/duodenum. Hb 8 to 7.4 (baseline 12.6 12/2021). No melena. Had similar presentation 04/2022 @ [facility], s/p EGD which showed diffuse oozing. No NSAIDs. Nl gastrin.  Plan: -EGD in AM with Dr Loreta Ave (she is aware) -Give 10 mg of Reglan IV on-call to Endo unit. -Trend CBC. Keep Hb>7. -IV Protonix -Avoid nonsteroidals -Resume Carafate depending upon EGD tomorrow -Outpt FU with Dr Edwin Dada ([facility]) -Also, outpt [facility] with Dr Myna Hidalgo (hematology)   Edman Circle, MD [facility] [phone]

## 2022-05-26 NOTE — ED Notes (Signed)
[facility] called, ETA 15 min. Pt stable for transfer ED to ED to [facility]

## 2022-05-26 NOTE — ED Provider Notes (Signed)
Spoke with Dr. Dwain Sarna of general surgery.  Patient does not necessarily need to be seen by surgery.  Dr. Elnoria Howard can consult if it is bypass related post endoscopy.    I have added Dr. Elnoria Howard to the secure chat with hospitalist team regarding this information   Kenika Sahm, MD 05/26/22 [id number]

## 2022-05-26 NOTE — ED Provider Notes (Signed)
[facility] EMERGENCY DEPARTMENT Provider Note   CSN: [id number] Arrival date & time: 05/25/22  2208     History  Chief Complaint  Patient presents with   Abdominal Pain    Meagan Floyd is a 56 y.o. female.  The history is provided by the patient and the spouse.  Abdominal Pain Pain location:  LUQ Pain radiates to:  L flank Pain severity:  Severe Onset quality:  Sudden Duration: hours. Timing:  Constant Progression:  Unchanged Chronicity:  Recurrent Context: not trauma   Relieved by:  Nothing Patient s/p Roux en Y gastric bypass who was admitted at [facility] 8/2-8/4 for GI bleed presents with L upper abdominal pain and hematemesis.  Pain is severe.       Home Medications Prior to Admission medications   Medication Sig Start Date End Date Taking? Authorizing Provider  DULoxetine (CYMBALTA) 60 MG capsule TAKE 1 CAPSULE BY MOUTH EVERY DAY 04/17/22   Christen Butter, NP  omeprazole (PRILOSEC OTC) 20 MG tablet Take 20 mg by mouth 2 (two) times daily. 03/26/22   [provider]  ondansetron (ZOFRAN-ODT) 4 MG disintegrating tablet Take 4 mg by mouth every 8 (eight) hours as needed. 04/21/22   [provider]  pantoprazole (PROTONIX) 40 MG tablet Take 40 mg by mouth daily. 05/02/22   [provider]  sucralfate (CARAFATE) 1 GM/10ML suspension Take 1 g by mouth 3 (three) times daily. 03/26/22   [provider]  traMADol Janean Sark) 50 MG tablet Take by mouth. 04/21/22   [provider]      Allergies    Shingrix [zoster vac recomb adjuvanted], Belsomra [suvorexant], Codeine, and Penicillins    Review of Systems   Review of Systems  Gastrointestinal:  Positive for abdominal pain.    Physical Exam Updated Vital Signs BP (!) 147/90 (BP Location: Right Arm)   Pulse 100   Temp 98 F (36.7 C) (Oral)   Resp 13   Ht 5\' 4"  (1.626 m)   Wt 95.3 kg   SpO2 97%   BMI 36.05 kg/m  Physical Exam Vitals and nursing note  reviewed.  Constitutional:      General: She is not in acute distress.    Appearance: Normal appearance. She is well-developed.  HENT:     Head: Normocephalic and atraumatic.     Nose: Nose normal.  Eyes:     Pupils: Pupils are equal, round, and reactive to light.  Cardiovascular:     Rate and Rhythm: Regular rhythm. Tachycardia present.     Pulses: Normal pulses.     Heart sounds: Normal heart sounds.  Pulmonary:     Effort: Pulmonary effort is normal. No respiratory distress.     Breath sounds: Normal breath sounds.  Abdominal:     General: Bowel sounds are normal. There is no distension.     Palpations: Abdomen is soft.     Tenderness: There is no abdominal tenderness. There is no guarding or rebound.  Genitourinary:    Vagina: No vaginal discharge.     Rectum: Guaiac result negative.  Musculoskeletal:        General: Normal range of motion.     Cervical back: Neck supple.  Skin:    General: Skin is warm and dry.     Capillary Refill: Capillary refill takes less than 2 seconds.     Findings: No erythema or rash.  Neurological:     General: No focal deficit present.     Mental Status:  She is alert.     Deep Tendon Reflexes: Reflexes normal.  Psychiatric:        Mood and Affect: Mood is anxious. Affect is tearful.     ED Results / Procedures / Treatments   Labs (all labs ordered are listed, but only abnormal results are displayed) Results for orders placed or performed during the hospital encounter of 05/25/22  Lipase, blood  Result Value Ref Range   Lipase 28 11 - 51 U/L  Comprehensive metabolic panel  Result Value Ref Range   Sodium 140 135 - 145 mmol/L   Potassium 3.9 3.5 - 5.1 mmol/L   Chloride 108 98 - 111 mmol/L   CO2 21 (L) 22 - 32 mmol/L   Glucose, Bld 102 (H) 70 - 99 mg/dL   BUN 24 (H) 6 - 20 mg/dL   Creatinine, Ser 0.79 0.44 - 1.00 mg/dL   Calcium 9.3 8.9 - 10.3 mg/dL   Total Protein 7.0 6.5 - 8.1 g/dL   Albumin 3.7 3.5 - 5.0 g/dL   AST 24 15 - 41  U/L   ALT 18 0 - 44 U/L   Alkaline Phosphatase 76 38 - 126 U/L   Total Bilirubin 0.3 0.3 - 1.2 mg/dL   GFR, Estimated >60 >60 mL/min   Anion gap 11 5 - 15  CBC  Result Value Ref Range   WBC 12.8 (H) 4.0 - 10.5 K/uL   RBC 3.19 (L) 3.87 - 5.11 MIL/uL   Hemoglobin 8.2 (L) 12.0 - 15.0 g/dL   HCT 26.5 (L) 36.0 - 46.0 %   MCV 83.1 80.0 - 100.0 fL   MCH 25.7 (L) 26.0 - 34.0 pg   MCHC 30.9 30.0 - 36.0 g/dL   RDW 17.0 (H) 11.5 - 15.5 %   Platelets 484 (H) 150 - 400 K/uL   nRBC 0.0 0.0 - 0.2 %  Urinalysis, Routine w reflex microscopic Urine, Clean Catch  Result Value Ref Range   Color, Urine YELLOW YELLOW   APPearance CLEAR CLEAR   Specific Gravity, Urine 1.015 1.005 - 1.030   pH 6.0 5.0 - 8.0   Glucose, UA NEGATIVE NEGATIVE mg/dL   Hgb urine dipstick NEGATIVE NEGATIVE   Bilirubin Urine NEGATIVE NEGATIVE   Ketones, ur 15 (A) NEGATIVE mg/dL   Protein, ur NEGATIVE NEGATIVE mg/dL   Nitrite NEGATIVE NEGATIVE   Leukocytes,Ua NEGATIVE NEGATIVE  Pregnancy, urine  Result Value Ref Range   Preg Test, Ur NEGATIVE NEGATIVE  Occult blood card to lab, stool Provider will collect  Result Value Ref Range   Fecal Occult Bld NEGATIVE NEGATIVE   CT ABDOMEN PELVIS W CONTRAST  Result Date: 05/26/2022 CLINICAL DATA:  Gastric bypass surgery on April 30, 2022, presenting with intermittent left lower quadrant pain. EXAM: CT ABDOMEN AND PELVIS WITH CONTRAST TECHNIQUE: Multidetector CT imaging of the abdomen and pelvis was performed using the standard protocol following bolus administration of intravenous contrast. RADIATION DOSE REDUCTION: This exam was performed according to the departmental dose-optimization program which includes automated exposure control, adjustment of the mA and/or kV according to patient size and/or use of iterative reconstruction technique. CONTRAST:  156mL OMNIPAQUE IOHEXOL 300 MG/ML  SOLN COMPARISON:  None Available. FINDINGS: Lower chest: No acute abnormality. Hepatobiliary: No focal  liver abnormality is seen. No gallstones, gallbladder wall thickening, or biliary dilatation. Pancreas: Unremarkable. No pancreatic ductal dilatation or surrounding inflammatory changes. Spleen: Normal in size without focal abnormality. Adrenals/Urinary Tract: Adrenal glands are unremarkable. Kidneys are normal, without renal calculi, focal lesion,  or hydronephrosis. Bladder is unremarkable. Stomach/Bowel: Multiple surgical sutures are seen within the gastric region. Surgically anastomosed bowel is also seen within the anterior aspect of the mid to upper left abdomen. 6.5 cm x 3.5 cm x 4.5 cm and 8.8 cm x 4.7 cm x 3.4 cm areas of ill-defined heterogeneous increased attenuation (approximately 52.62 Hounsfield units) are seen within the lumen of the gastric body and gastric antrum. Extension into the duodenal bulb is noted. A similar appearing 2.5 cm x 4.1 cm x 3.8 cm area of increased attenuation (approximately 48.00 Hounsfield units) is noted within the lumen of the proximal duodenum. Appendix appears normal. No evidence of bowel wall thickening, distention, or inflammatory changes. Vascular/Lymphatic: Aortic atherosclerosis. No enlarged abdominal or pelvic lymph nodes. Reproductive: Uterus and bilateral adnexa are unremarkable. Other: No abdominal wall hernia or abnormality. No abdominopelvic ascites. Musculoskeletal: No acute or significant osseous findings. IMPRESSION: 1. Findings consistent with prior gastric bypass surgery with a large amount of evolving blood products seen within the stomach and proximal duodenum. Follow-up with endoscopy is recommended to exclude the presence of an underlying neoplastic process. 2. Aortic atherosclerosis. Aortic Atherosclerosis (ICD10-I70.0). Electronically Signed   By: Virgina Norfolk M.D.   On: 05/26/2022 00:18     Radiology CT ABDOMEN PELVIS W CONTRAST  Result Date: 05/26/2022 CLINICAL DATA:  Gastric bypass surgery on April 30, 2022, presenting with intermittent  left lower quadrant pain. EXAM: CT ABDOMEN AND PELVIS WITH CONTRAST TECHNIQUE: Multidetector CT imaging of the abdomen and pelvis was performed using the standard protocol following bolus administration of intravenous contrast. RADIATION DOSE REDUCTION: This exam was performed according to the departmental dose-optimization program which includes automated exposure control, adjustment of the mA and/or kV according to patient size and/or use of iterative reconstruction technique. CONTRAST:  140mL OMNIPAQUE IOHEXOL 300 MG/ML  SOLN COMPARISON:  None Available. FINDINGS: Lower chest: No acute abnormality. Hepatobiliary: No focal liver abnormality is seen. No gallstones, gallbladder wall thickening, or biliary dilatation. Pancreas: Unremarkable. No pancreatic ductal dilatation or surrounding inflammatory changes. Spleen: Normal in size without focal abnormality. Adrenals/Urinary Tract: Adrenal glands are unremarkable. Kidneys are normal, without renal calculi, focal lesion, or hydronephrosis. Bladder is unremarkable. Stomach/Bowel: Multiple surgical sutures are seen within the gastric region. Surgically anastomosed bowel is also seen within the anterior aspect of the mid to upper left abdomen. 6.5 cm x 3.5 cm x 4.5 cm and 8.8 cm x 4.7 cm x 3.4 cm areas of ill-defined heterogeneous increased attenuation (approximately 52.62 Hounsfield units) are seen within the lumen of the gastric body and gastric antrum. Extension into the duodenal bulb is noted. A similar appearing 2.5 cm x 4.1 cm x 3.8 cm area of increased attenuation (approximately 48.00 Hounsfield units) is noted within the lumen of the proximal duodenum. Appendix appears normal. No evidence of bowel wall thickening, distention, or inflammatory changes. Vascular/Lymphatic: Aortic atherosclerosis. No enlarged abdominal or pelvic lymph nodes. Reproductive: Uterus and bilateral adnexa are unremarkable. Other: No abdominal wall hernia or abnormality. No abdominopelvic  ascites. Musculoskeletal: No acute or significant osseous findings. IMPRESSION: 1. Findings consistent with prior gastric bypass surgery with a large amount of evolving blood products seen within the stomach and proximal duodenum. Follow-up with endoscopy is recommended to exclude the presence of an underlying neoplastic process. 2. Aortic atherosclerosis. Aortic Atherosclerosis (ICD10-I70.0). Electronically Signed   By: Virgina Norfolk M.D.   On: 05/26/2022 00:18    Procedures Procedures    Medications Ordered in ED Medications  pantoprazole (PROTONIX) 80 mg /  NS 100 mL IVPB (has no administration in time range)  pantoprozole (PROTONIX) 80 mg /NS 100 mL infusion (has no administration in time range)  pantoprazole (PROTONIX) injection 40 mg (has no administration in time range)  haloperidol lactate (HALDOL) injection 2 mg (has no administration in time range)  fentaNYL (SUBLIMAZE) injection 50 mcg (has no administration in time range)  lactated ringers bolus 2,000 mL (2,000 mLs Intravenous New Bag/Given 05/25/22 2313)  fentaNYL (SUBLIMAZE) injection 50 mcg (50 mcg Intravenous Given 05/25/22 2316)  ondansetron (ZOFRAN) injection 4 mg (4 mg Intravenous Given 05/25/22 2315)  haloperidol lactate (HALDOL) injection 2 mg (2 mg Intravenous Given 05/25/22 2330)  iohexol (OMNIPAQUE) 300 MG/ML solution 100 mL (100 mLs Intravenous Contrast Given 05/26/22 0002)    ED Course/ Medical Decision Making/ A&P                           Medical Decision Making Patient with GIB at the beginning of the month presents with sudden onset LUQ pain and hematemesis.    Amount and/or Complexity of Data Reviewed Independent Historian: spouse    Details: See above  External Data Reviewed: labs and notes.    Details: Admission note from [facility] and most recent hemoglobin 8.8  Labs: ordered.    Details: All labs reviewed:  white count elevated 12.8, hemoglobin low 8.2 down from 8.8 at [facility].  Elevated platelet count  484K.  Lipase normal 28,  Radiology: ordered and independent interpretation performed.    Details: Clots in the stomach by me on CT Discussion of management or test interpretation with external provider(s): Dr. Elnoria Howard of GI secure chat placed.   Dr. Leafy Half who will accept the patient Dr. Read Drivers, accepts ED to ED  Risk Prescription drug management. Parenteral controlled substances. Decision regarding hospitalization. Risk Details: Patient is refusing readmission or transfer to [facility].  She does not feel the much was done for her there and no cause was found. HR is markedly improved during her ED visit, BP is stable.  IVF and protonix initiated in the ED.  GI was contacted via secure chat.  Patient is currently hemodynamically stable but would be best served at a hospital with blood and endoscopy.  Dr. Elnoria Howard secure chat placed.    Critical Care Total time providing critical care: 60 minutes    Final Clinical Impression(s) / ED Diagnoses Final diagnoses:  Gastrointestinal hemorrhage with hematemesis   The patient appears reasonably stabilized for admission considering the current resources, flow, and capabilities available in the ED at this time, and I doubt any other [facility] requiring further screening and/or treatment in the ED prior to admission.  Rx / DC Orders ED Discharge Orders     None         Anayelli Lai, MD 05/26/22 [phone]

## 2022-05-26 NOTE — ED Notes (Signed)
Report given to Leta Jungling, Charity fundraiser at [employer].  Pt stable for transport.  [facility] at bedside

## 2022-05-27 ENCOUNTER — Encounter (HOSPITAL_COMMUNITY): Payer: Self-pay | Admitting: Internal Medicine

## 2022-05-27 ENCOUNTER — Inpatient Hospital Stay (HOSPITAL_COMMUNITY): Payer: 59 | Admitting: Certified Registered"

## 2022-05-27 ENCOUNTER — Encounter (HOSPITAL_COMMUNITY)
Admission: EM | Disposition: A | Discharge status: Home / Self Care | Payer: Self-pay | Source: Home / Self Care | Attending: Internal Medicine

## 2022-05-27 DIAGNOSIS — K922 Gastrointestinal hemorrhage, unspecified: Secondary | ICD-10-CM | POA: Diagnosis not present

## 2022-05-27 DIAGNOSIS — D62 Acute posthemorrhagic anemia: Secondary | ICD-10-CM | POA: Diagnosis not present

## 2022-05-27 DIAGNOSIS — F419 Anxiety disorder, unspecified: Secondary | ICD-10-CM | POA: Diagnosis not present

## 2022-05-27 DIAGNOSIS — F418 Other specified anxiety disorders: Secondary | ICD-10-CM

## 2022-05-27 DIAGNOSIS — F32A Depression, unspecified: Secondary | ICD-10-CM

## 2022-05-27 DIAGNOSIS — I10 Essential (primary) hypertension: Secondary | ICD-10-CM

## 2022-05-27 DIAGNOSIS — E669 Obesity, unspecified: Secondary | ICD-10-CM | POA: Diagnosis not present

## 2022-05-27 HISTORY — PX: ESOPHAGOGASTRODUODENOSCOPY (EGD) WITH PROPOFOL: SHX5813

## 2022-05-27 LAB — BASIC METABOLIC PANEL
Anion gap: 7 (ref 5–15)
BUN: 15 mg/dL (ref 6–20)
CO2: 26 mmol/L (ref 22–32)
Calcium: 9.1 mg/dL (ref 8.9–10.3)
Chloride: 107 mmol/L (ref 98–111)
Creatinine, Ser: 0.57 mg/dL (ref 0.44–1.00)
GFR, Estimated: >60 mL/min (ref >60)
Glucose, Bld: 135 mg/dL — ABNORMAL HIGH (ref 70–99)
Potassium: 3.5 mmol/L (ref 3.5–5.1)
Sodium: 140 mmol/L (ref 135–145)

## 2022-05-27 LAB — CBC WITH DIFFERENTIAL/PLATELET
Abs Immature Granulocytes: 0.11 10*3/uL — ABNORMAL HIGH (ref 0.00–0.07)
Basophils Absolute: 0 10*3/uL (ref 0.0–0.1)
Basophils Relative: 0 %
Eosinophils Absolute: 0 10*3/uL (ref 0.0–0.5)
Eosinophils Relative: 0 %
HCT: 23.6 % — ABNORMAL LOW (ref 36.0–46.0)
Hemoglobin: 7.1 g/dL — ABNORMAL LOW (ref 12.0–15.0)
Immature Granulocytes: 1 %
Lymphocytes Relative: 8 %
Lymphs Abs: 1.4 10*3/uL (ref 0.7–4.0)
MCH: 25.6 pg — ABNORMAL LOW (ref 26.0–34.0)
MCHC: 30.1 g/dL (ref 30.0–36.0)
MCV: 85.2 fL (ref 80.0–100.0)
Monocytes Absolute: 1.3 10*3/uL — ABNORMAL HIGH (ref 0.1–1.0)
Monocytes Relative: 7 %
Neutro Abs: 15.4 10*3/uL — ABNORMAL HIGH (ref 1.7–7.7)
Neutrophils Relative %: 84 %
Platelets: 423 10*3/uL — ABNORMAL HIGH (ref 150–400)
RBC: 2.77 MIL/uL — ABNORMAL LOW (ref 3.87–5.11)
RDW: 17.3 % — ABNORMAL HIGH (ref 11.5–15.5)
WBC: 18.1 10*3/uL — ABNORMAL HIGH (ref 4.0–10.5)
nRBC: 0 % (ref 0.0–0.2)

## 2022-05-27 LAB — PREPARE RBC (CROSSMATCH)

## 2022-05-27 LAB — HEMOGLOBIN AND HEMATOCRIT, BLOOD
HCT: 28.6 % — ABNORMAL LOW (ref 36.0–46.0)
Hemoglobin: 8.5 g/dL — ABNORMAL LOW (ref 12.0–15.0)

## 2022-05-27 LAB — MAGNESIUM: Magnesium: 2 mg/dL (ref 1.7–2.4)

## 2022-05-27 SURGERY — ESOPHAGOGASTRODUODENOSCOPY (EGD) WITH PROPOFOL
Anesthesia: Monitor Anesthesia Care

## 2022-05-27 MED ORDER — PROPOFOL 500 MG/50ML IV EMUL
INTRAVENOUS | Status: DC | PRN
  Administered 2022-05-27: 100 ug/kg/min via INTRAVENOUS
  Administered 2022-05-27 (×2): 100 mg via INTRAVENOUS

## 2022-05-27 MED ORDER — LACTATED RINGERS IV SOLN: INTRAVENOUS | Status: DC | PRN

## 2022-05-27 MED ORDER — SODIUM CHLORIDE 0.9% IV SOLUTION
Freq: Once | INTRAVENOUS | Status: AC
Start: 2022-05-27 — End: 2022-05-27

## 2022-05-27 MED ORDER — SODIUM CHLORIDE 0.9 % IV SOLN: INTRAVENOUS | Status: DC

## 2022-05-27 MED ORDER — LIDOCAINE HCL (PF) 2 % IJ SOLN
INTRAMUSCULAR | Status: DC | PRN
  Administered 2022-05-27: 100 mg via INTRADERMAL

## 2022-05-27 MED ORDER — SODIUM CHLORIDE 0.9 % IV SOLN
INTRAVENOUS | Status: DC
Start: 2022-05-27 — End: 2022-05-27

## 2022-05-27 SURGICAL SUPPLY — 15 items

## 2022-05-27 NOTE — Op Note (Addendum)
[place] [place] Patient Name: Meagan Floyd Procedure Date: 05/27/2022 MRN: [id number] Attending MD: Jeani Hawking , MD Date of Birth: [birth year] CSN: [id number] Age: 56 Admit Type: Inpatient Procedure:                Upper GI endoscopy Indications:              Abdominal pain in the left upper quadrant, Acute                            post hemorrhagic anemia Providers:                Jeani Hawking, MD, Fransisca Connors, [place], Technician Referring MD:              Medicines:                Propofol per Anesthesia Complications:            No immediate complications. Estimated Blood Loss:     Estimated blood loss: none. Procedure:                Pre-Anesthesia Assessment:                           - Prior to the procedure, a History and Physical                            was performed, and patient medications and                            allergies were reviewed. The patient's tolerance of                            previous anesthesia was also reviewed. The risks                            and benefits of the procedure and the sedation                            options and risks were discussed with the patient.                            All questions were answered, and informed consent                            was obtained. Prior Anticoagulants: The patient has                            taken no previous anticoagulant or antiplatelet                            agents. ASA Grade Assessment: II - A patient with  mild systemic disease. After reviewing the risks                            and benefits, the patient was deemed in                            satisfactory condition to undergo the procedure.                           - Sedation was administered by an anesthesia                            professional. Deep sedation was attained.                           After obtaining informed consent, the  endoscope was                            passed under direct vision. Throughout the                            procedure, the patient's blood pressure, pulse, and                            oxygen saturations were monitored continuously. The                            [id number] Olympus endoscope was introduced                            through the mouth, and advanced to the efferent                            jejunal loop. The upper GI endoscopy was                            accomplished without difficulty. The patient                            tolerated the procedure well. Scope In: Scope Out: Findings:      The esophagus was normal.      Evidence of a gastric bypass (? SADI - Single anastomosis duodeno-ileal       bypass) was found in the gastric body. This was characterized by friable       mucosa.      The examined jejunum was normal.      There was no evidence of blood in the upper GI tract. The Z-line was       sharp and there was no suggesting of an esophagitis or Barrett's       esophagus as reported by the [facility] EGD. The gastric pouch was       very large at 18 cm. At the gastrojejunostomy some mild friability was       noted as well as a couple of sutures. This does not explain the source  of her bleeding. There was no evidence of any ulcerations or erosions.       Deep intubation of the efferent and afferent limbs of the jejunum were       performed. Dark bile was noted in both limbs. Impression:               - Normal esophagus.                           - Roux-en-Y gastrojejunostomy with gastrojejunal                            anastomosis characterized by friable mucosa.                           - Normal examined jejunum.                           - No specimens collected. Moderate Sedation:      Not Applicable - Patient had care per Anesthesia. Recommendation:           - Return patient to hospital ward for ongoing care.                            - Resume regular diet.                           - Continue present medications.                           - Maintain PPI.                           - Follow HGB and transfuse as necessary. Procedure Code(s):        --- Professional ---                           [phone], Esophagogastroduodenoscopy, flexible,                            transoral; diagnostic, including collection of                            specimen(s) by brushing or washing, when performed                            (separate procedure) Diagnosis Code(s):        --- Professional ---                           Z98.0, Intestinal bypass and anastomosis status                           D62, Acute posthemorrhagic anemia                           R10.12, Left upper quadrant pain CPT copyright 2019 American Medical Association. All rights reserved. The codes documented in this report are  preliminary and upon coder review may  be revised to meet current compliance requirements. Carol Ada, MD Carol Ada, MD 05/27/2022 12:31:21 PM This report has been signed electronically. Number of Addenda: 0

## 2022-05-27 NOTE — Anesthesia Preprocedure Evaluation (Signed)
Anesthesia Evaluation  Patient identified by MRN, date of birth, ID band Patient awake    Reviewed: Allergy & Precautions, NPO status , Patient's Chart, lab work & pertinent test results  Airway Mallampati: I       Dental no notable dental hx.    Pulmonary    Pulmonary exam normal        Cardiovascular hypertension, Normal cardiovascular exam     Neuro/Psych  Headaches, PSYCHIATRIC DISORDERS Anxiety Depression    GI/Hepatic GERD  Medicated,  Endo/Other    Renal/GU   negative genitourinary   Musculoskeletal negative musculoskeletal ROS (+)   Abdominal (+) + obese,   Peds  Hematology  (+) Blood dyscrasia, anemia ,   Anesthesia Other Findings   Reproductive/Obstetrics                             Anesthesia Physical Anesthesia Plan  ASA: 2  Anesthesia Plan: MAC   Post-op Pain Management:    Induction:   PONV Risk Score and Plan: Propofol infusion and TIVA  Airway Management Planned: Natural Airway and Mask  Additional Equipment: None  Intra-op Plan:   Post-operative Plan:   Informed Consent: I have reviewed the patients History and Physical, chart, labs and discussed the procedure including the risks, benefits and alternatives for the proposed anesthesia with the patient or authorized representative who has indicated his/her understanding and acceptance.     Dental advisory given  Plan Discussed with: CRNA  Anesthesia Plan Comments:         Anesthesia Quick Evaluation

## 2022-05-27 NOTE — Transfer of Care (Signed)
Immediate Anesthesia Transfer of Care Note  Patient: Meagan Floyd  Procedure(s) Performed: ESOPHAGOGASTRODUODENOSCOPY (EGD) WITH PROPOFOL  Patient Location: PACU  Anesthesia Type:MAC  Level of Consciousness: awake  Airway & Oxygen Therapy: Patient Spontanous Breathing  Post-op Assessment: Report given to RN  Post vital signs: stable  Last Vitals:  Vitals Value Taken Time  BP 146/67 05/27/22 1230  Temp    Pulse 93 05/27/22 1232  Resp 23 05/27/22 1232  SpO2 91 % 05/27/22 1232  Vitals shown include unvalidated device data.  Last Pain:  Vitals:   05/27/22 1220  TempSrc: Temporal  PainSc:       Patients Stated Pain Goal: 2 (52/59/10 2890)  Complications: No notable events documented.

## 2022-05-27 NOTE — Anesthesia Postprocedure Evaluation (Signed)
Anesthesia Post Note  Patient: Meagan Floyd  Procedure(s) Performed: ESOPHAGOGASTRODUODENOSCOPY (EGD) WITH PROPOFOL     Patient location during evaluation: Endoscopy Anesthesia Type: MAC Level of consciousness: awake Pain management: pain level controlled Vital Signs Assessment: post-procedure vital signs reviewed and stable Respiratory status: spontaneous breathing Cardiovascular status: stable Postop Assessment: no apparent nausea or vomiting Anesthetic complications: no   No notable events documented.  Last Vitals:  Vitals:   05/27/22 1230 05/27/22 1240  BP: (!) 146/67 (!) 156/60  Pulse: 92 92  Resp: (!) 23 (!) 22  Temp:    SpO2: 92% 96%    Last Pain:  Vitals:   05/27/22 1240  TempSrc:   PainSc: 0-No pain                 Huston Foley

## 2022-05-27 NOTE — Plan of Care (Signed)
  Problem: Education: Goal: Knowledge of General Education information will improve Description: Including pain rating scale, medication(s)/side effects and non-pharmacologic comfort measures Outcome: Completed/Met   Problem: Activity: Goal: Risk for activity intolerance will decrease Outcome: Progressing   

## 2022-05-27 NOTE — Plan of Care (Signed)
  Problem: Health Behavior/Discharge Planning: Goal: Ability to manage health-related needs will improve Outcome: Progressing   Problem: Clinical Measurements: Goal: Ability to maintain clinical measurements within normal limits will improve Outcome: Progressing Goal: Will remain free from infection Outcome: Progressing   

## 2022-05-27 NOTE — Interval H&P Note (Signed)
History and Physical Interval Note:  05/27/2022 12:01 PM  Meagan Floyd  has presented today for surgery, with the diagnosis of left upper quadrant pain, upper GI bleed.  The various methods of treatment have been discussed with the patient and family. After consideration of risks, benefits and other options for treatment, the patient has consented to  Procedure(s): ESOPHAGOGASTRODUODENOSCOPY (EGD) WITH PROPOFOL (N/A) as a surgical intervention.  The patient's history has been reviewed, patient examined, no change in status, stable for surgery.  I have reviewed the patient's chart and labs.  Questions were answered to the patient's satisfaction.     Janine Reller D

## 2022-05-27 NOTE — Progress Notes (Signed)
Mobility Specialist - Progress Note     05/27/22 1448  Oxygen Therapy  O2 Device Room Air  Mobility  Activity Ambulated with assistance in hallway (IV Pole)  Range of Motion/Exercises Active  Level of Assistance Independent  Assistive Device None  Distance Ambulated (ft) 500 ft  Activity Response Tolerated well  $Mobility charge 1 Mobility   Pt was found on bed and agreeable to mobilize. Pt had no complaints during ambulation and at EOS returned back to bed with all necessities in reach.  Billey Chang Mobility Specialist

## 2022-05-27 NOTE — Progress Notes (Signed)
PROGRESS NOTE    Meagan Floyd  VOZ:[id number] DOB: Apr 17, 1966 DOA: 05/25/2022 PCP: Christen Butter, NP   Brief Narrative:  56 y.o. female with medical history significant for remote gastric bypass, GERD, chronic anxiety/depression, recently admitted at [facility] health on 04/30/2022 for severe abdominal pain and had a negative EGD presented with left upper quadrant abdominal pain.  On presentation, contrast-enhanced CT scan showed blood contained in stomach and proximal duodenum.  GI was consulted.  Hemoglobin was 8.2.  She was started on IV Protonix.  Assessment & Plan:   Possible upper GI bleeding Severe abdominal pain -Had a recent EGD at [facility] which was negative -Contrast CT showed blood content in the stomach and proximal duodenum -Currently on Protonix drip.  Hemodynamically stable.  GI following and planning for EGD today  Acute blood loss anemia -Hemoglobin 8.6 on presentation.  Hemoglobin was 12.6 4 months ago but was 8.8.  Recent presentation to [facility] -Hemoglobin 7.1 this morning.  Transfuse 1 unit packed red cells.  Monitor H&H  Leukocytosis -Probably reactive.  Worsening.  Monitor  Thrombocytosis -Possibly reactive.  Monitor  Obesity -Outpatient follow-up  History of remote gastric bypass -Outpatient follow-up  Chronic anxiety/depression -Continue duloxetine   DVT prophylaxis: SCDs Code Status: Full Family Communication: None at bedside Disposition Plan: Status is: Inpatient Remains inpatient appropriate because: Of severity of illness   Consultants: GI  Procedures: None  Antimicrobials: None   Subjective: Patient seen and examined at bedside.  Abdominal pain is improving.  Denies any overnight fever, vomiting, black or bloody stools.  Objective: Vitals:   05/26/22 1522 05/26/22 1524 05/26/22 2325 05/27/22 0429  BP:  (!) 144/79 (!) 152/73 (!) 148/81  Pulse:  81 90 90  Resp:  20 20 19   Temp: 98.9 F (37.2 C)  99.3 F (37.4 C) 99 F (37.2 C)   TempSrc: Oral  Oral Oral  SpO2:  96% 94% 94%  Weight:  97 kg    Height:  5\' 4"  (1.626 m)      Intake/Output Summary (Last 24 hours) at 05/27/2022 1010 Last data filed at 05/27/2022 0200 Gross per 24 hour  Intake 537.99 ml  Output --  Net 537.99 ml   Filed Weights   05/25/22 2215 05/26/22 1524  Weight: 95.3 kg 97 kg    Examination:  General exam: Appears calm and comfortable  Respiratory system: Bilateral decreased breath sounds at bases Cardiovascular system: S1 & S2 heard, Rate controlled Gastrointestinal system: Abdomen is distended slightly, soft and mildly tender in the upper quadrant.  Normal bowel sounds heard. Extremities: No cyanosis, clubbing, edema  Central nervous system: Alert and oriented. No focal neurological deficits. Moving extremities Skin: No rashes, lesions or ulcers Psychiatry: Judgement and insight appear normal. Mood & affect appropriate.     Data Reviewed: I have personally reviewed following labs and imaging studies  CBC: Recent Labs  Lab 05/25/22 2243 05/26/22 0340 05/26/22 0932 05/26/22 1546 05/26/22 2124 05/27/22 0523  WBC 12.8* 12.8*  --   --   --  18.1*  NEUTROABS  --   --   --   --   --  15.4*  HGB 8.2* 7.4* 7.4* 7.4* 7.5* 7.1*  HCT 26.5* 25.0* 25.0* 24.9* 25.9* 23.6*  MCV 83.1 86.2  --   --   --  85.2  PLT 484* 396  --   --   --  423*   Basic Metabolic Panel: Recent Labs  Lab 05/25/22 2243 05/26/22 0340 05/27/22 0523  NA 140 139  140  K 3.9 3.5 3.5  CL 108 107 107  CO2 21* 24 26  GLUCOSE 102* 151* 135*  BUN 24* 23* 15  CREATININE 0.79 0.59 0.57  CALCIUM 9.3 8.4* 9.1  MG  --  2.0 2.0  PHOS  --  3.8  --    GFR: Estimated Creatinine Clearance: 90.9 mL/min (by C-G formula based on SCr of 0.57 mg/dL). Liver Function Tests: Recent Labs  Lab 05/25/22 2243 05/26/22 0340  AST 24 17  ALT 18 18  ALKPHOS 76 69  BILITOT 0.3 0.3  PROT 7.0 6.5  ALBUMIN 3.7 3.5   Recent Labs  Lab 05/25/22 2243  LIPASE 28   No results  for input(s): "AMMONIA" in the last 168 hours. Coagulation Profile: Recent Labs  Lab 05/26/22 0340  INR 1.1   Cardiac Enzymes: No results for input(s): "CKTOTAL", "CKMB", "CKMBINDEX", "TROPONINI" in the last 168 hours. BNP (last 3 results) No results for input(s): "PROBNP" in the last 8760 hours. HbA1C: No results for input(s): "HGBA1C" in the last 72 hours. CBG: Recent Labs  Lab 05/26/22 2322  GLUCAP 136*   Lipid Profile: No results for input(s): "CHOL", "HDL", "LDLCALC", "TRIG", "CHOLHDL", "LDLDIRECT" in the last 72 hours. Thyroid Function Tests: No results for input(s): "TSH", "T4TOTAL", "FREET4", "T3FREE", "THYROIDAB" in the last 72 hours. Anemia Panel: No results for input(s): "VITAMINB12", "FOLATE", "FERRITIN", "TIBC", "IRON", "RETICCTPCT" in the last 72 hours. Sepsis Labs: No results for input(s): "PROCALCITON", "LATICACIDVEN" in the last 168 hours.  No results found for this or any previous visit (from the past 240 hour(s)).       Radiology Studies: CT ABDOMEN PELVIS W CONTRAST  Result Date: 05/26/2022 CLINICAL DATA:  Gastric bypass surgery on April 30, 2022, presenting with intermittent left lower quadrant pain. EXAM: CT ABDOMEN AND PELVIS WITH CONTRAST TECHNIQUE: Multidetector CT imaging of the abdomen and pelvis was performed using the standard protocol following bolus administration of intravenous contrast. RADIATION DOSE REDUCTION: This exam was performed according to the departmental dose-optimization program which includes automated exposure control, adjustment of the mA and/or kV according to patient size and/or use of iterative reconstruction technique. CONTRAST:  OMNIPAQUE IOHEXOL 300 MG/ML  SOLN COMPARISON:  None Available. FINDINGS: Lower chest: No acute abnormality. Hepatobiliary: No focal liver abnormality is seen. No gallstones, gallbladder wall thickening, or biliary dilatation. Pancreas: Unremarkable. No pancreatic ductal dilatation or surrounding  inflammatory changes. Spleen: Normal in size without focal abnormality. Adrenals/Urinary Tract: Adrenal glands are unremarkable. Kidneys are normal, without renal calculi, focal lesion, or hydronephrosis. Bladder is unremarkable. Stomach/Bowel: Multiple surgical sutures are seen within the gastric region. Surgically anastomosed bowel is also seen within the anterior aspect of the mid to upper left abdomen. 6.5 cm x 3.5 cm x 4.5 cm and 8.8 cm x 4.7 cm x 3.4 cm areas of ill-defined heterogeneous increased attenuation (approximately 52.62 Hounsfield units) are seen within the lumen of the gastric body and gastric antrum. Extension into the duodenal bulb is noted. A similar appearing 2.5 cm x 4.1 cm x 3.8 cm area of increased attenuation (approximately 48.00 Hounsfield units) is noted within the lumen of the proximal duodenum. Appendix appears normal. No evidence of bowel wall thickening, distention, or inflammatory changes. Vascular/Lymphatic: Aortic atherosclerosis. No enlarged abdominal or pelvic lymph nodes. Reproductive: Uterus and bilateral adnexa are unremarkable. Other: No abdominal wall hernia or abnormality. No abdominopelvic ascites. Musculoskeletal: No acute or significant osseous findings. IMPRESSION: 1. Findings consistent with prior gastric bypass surgery with a large amount  of evolving blood products seen within the stomach and proximal duodenum. Follow-up with endoscopy is recommended to exclude the presence of an underlying neoplastic process. 2. Aortic atherosclerosis. Aortic Atherosclerosis (ICD10-I70.0). Electronically Signed   By: Aram Candela M.D.   On: 05/26/2022 00:18        Scheduled Meds:  DULoxetine  60 mg Oral Daily   metoCLOPramide (REGLAN) injection  10 mg Intravenous 60 min Pre-Op   [START ON 05/29/2022] pantoprazole  40 mg Intravenous Q12H   senna-docusate  1 tablet Oral QHS   Continuous Infusions:  pantoprazole 8 mg/hr (05/27/22 0238)          Glade Lloyd,  MD [facility] 05/27/2022, 10:10 AM

## 2022-05-28 DIAGNOSIS — K922 Gastrointestinal hemorrhage, unspecified: Secondary | ICD-10-CM | POA: Diagnosis not present

## 2022-05-28 LAB — CBC WITH DIFFERENTIAL/PLATELET
Abs Immature Granulocytes: 0.12 10*3/uL — ABNORMAL HIGH (ref 0.00–0.07)
Basophils Absolute: 0.1 10*3/uL (ref 0.0–0.1)
Basophils Relative: 0 %
Eosinophils Absolute: 0.2 10*3/uL (ref 0.0–0.5)
Eosinophils Relative: 1 %
HCT: 27.3 % — ABNORMAL LOW (ref 36.0–46.0)
Hemoglobin: 8.2 g/dL — ABNORMAL LOW (ref 12.0–15.0)
Immature Granulocytes: 1 %
Lymphocytes Relative: 13 %
Lymphs Abs: 2.4 10*3/uL (ref 0.7–4.0)
MCH: 24.9 pg — ABNORMAL LOW (ref 26.0–34.0)
MCHC: 30 g/dL (ref 30.0–36.0)
MCV: 83 fL (ref 80.0–100.0)
Monocytes Absolute: 1.3 10*3/uL — ABNORMAL HIGH (ref 0.1–1.0)
Monocytes Relative: 7 %
Neutro Abs: 15.2 10*3/uL — ABNORMAL HIGH (ref 1.7–7.7)
Neutrophils Relative %: 78 %
Platelets: 391 10*3/uL (ref 150–400)
RBC: 3.29 MIL/uL — ABNORMAL LOW (ref 3.87–5.11)
RDW: 18.3 % — ABNORMAL HIGH (ref 11.5–15.5)
WBC: 19.3 10*3/uL — ABNORMAL HIGH (ref 4.0–10.5)
nRBC: 0 % (ref 0.0–0.2)

## 2022-05-28 LAB — BPAM RBC
Blood Product Expiration Date: 202309242359
ISSUE DATE / TIME: 202308291318
Unit Type and Rh: 6200

## 2022-05-28 LAB — TYPE AND SCREEN
ABO/RH(D): A POS
Antibody Screen: NEGATIVE
Unit division: 0

## 2022-05-28 LAB — MAGNESIUM: Magnesium: 2.1 mg/dL (ref 1.7–2.4)

## 2022-05-28 LAB — BASIC METABOLIC PANEL
Anion gap: 6 (ref 5–15)
BUN: 18 mg/dL (ref 6–20)
CO2: 28 mmol/L (ref 22–32)
Calcium: 8.7 mg/dL — ABNORMAL LOW (ref 8.9–10.3)
Chloride: 105 mmol/L (ref 98–111)
Creatinine, Ser: 0.61 mg/dL (ref 0.44–1.00)
GFR, Estimated: >60 mL/min (ref >60)
Glucose, Bld: 102 mg/dL — ABNORMAL HIGH (ref 70–99)
Potassium: 3.2 mmol/L — ABNORMAL LOW (ref 3.5–5.1)
Sodium: 139 mmol/L (ref 135–145)

## 2022-05-28 MED ORDER — POTASSIUM CHLORIDE CRYS ER 20 MEQ PO TBCR
40.0000 meq | EXTENDED_RELEASE_TABLET | ORAL | Status: AC
  Administered 2022-05-28 (×2): 40 meq via ORAL
  Filled 2022-05-28 (×2): qty 2

## 2022-05-28 NOTE — Discharge Summary (Signed)
PatientPhysician Discharge Summary  Meagan Floyd KDT:[id number] DOB: 30-Apr-1966 DOA: 05/25/2022  PCP: Christen Butter, NP  Admit date: 05/25/2022 Discharge date: 05/28/2022 30 Day Unplanned Readmission Risk Score    Flowsheet Row ED to Hosp-Admission (Current) from 05/25/2022 in [facility] FLOOR [facility]GR[facility]IVE C[facility] AND UROLOGY  30 Day Unplanned Readmission Risk Score (%) 13.94 Filed at 05/28/2022 1200       This score is the patient's risk of an unplanned readmission within 30 days of being discharged (0 -100%). The score is based on dignosis, age, lab data, medications, orders, and past utilization.   Low:  0-14.9   Medium: 15-21.9   High: 22-29.9   Extreme: 30 and above          Admitted From: Home Disposition: Home  Recommendations for Outpatient Follow-up:  Follow up with PCP in 1-2 weeks Please obtain BMP/CBC in one week Follow-up with your primary GI at [facility] in 2 weeks Please follow up with your PCP on the following pending results: Unresulted Labs (From admission, onward)    None         Home Health: None Equipment/Devices: None  Discharge Condition: Stable CODE STATUS: Full code Diet recommendation: Cardiac  Subjective: Seen and examined.  She has no complaints.  No nausea or vomiting.  Tolerating diet.  She desires to go home.  Brief/Interim Summary: 56 y.o. female with medical history significant for remote gastric bypass, GERD, chronic anxiety/depression, recently admitted at [facility] health on 04/30/2022 for severe abdominal pain and had a negative EGD presented with left upper quadrant abdominal pain.  On presentation, contrast-enhanced CT scan showed blood contained in stomach and proximal duodenum.  GI was consulted.  Hemoglobin was 8.2.  She was started on IV Protonix.  Admitted under hospital service.  Subsequently her hemoglobin dropped at 7.1 and she received 1 unit of PRBC transfusion yesterday, hemoglobin today is 8.2.  She has not had any hematemesis or  melena during hospitalization.  She underwent another EGD by Dr. Elnoria Howard on 05/27/2022 which showed normal esophagus, no source of bleeding, essentially unremarkable.  I personally discussed with Dr. Elnoria Howard and he has cleared the patient for discharge with recommendations to follow-up with her GI at [facility].  She was already taking PPI which she will need to continue.   Acute blood loss anemia -Hemoglobin 8.6 on presentation.  To 7.1 on 2923 and she received 1 unit of Erycette aspirin.  Hemoglobin 8.2 today.   Leukocytosis -Probably reactive.  Stable.  No fever.  No signs of infection.  Repeat CBC in 1 week at PCPs office.   Thrombocytosis -Possibly reactive.  Monitor   Obesity -Outpatient follow-up   History of remote gastric bypass -Outpatient follow-up   Chronic anxiety/depression -Continue duloxetine    Discharge plan was discussed with patient and/or family member and they verbalized understanding and agreed with it.  Discharge Diagnoses:  Principal Problem:   GI bleed Active Problems:   Obesity (BMI 30-39.9)   ABLA (acute blood loss anemia)   Anxiety and depression    Discharge Instructions   Allergies as of 05/28/2022       Reactions   Shingrix [zoster Vac Recomb Adjuvanted] Nausea And Vomiting   High fever   Belsomra [suvorexant] Other (See Comments)   paresthesias   Codeine Nausea Only   Penicillins Rash        Medication List     STOP taking these medications    traMADol 50 MG tablet Commonly known as: Janean Sark  TAKE these medications    DULoxetine 60 MG capsule Commonly known as: CYMBALTA TAKE 1 CAPSULE BY MOUTH EVERY DAY What changed:  how much to take how to take this when to take this additional instructions   ondansetron 4 MG disintegrating tablet Commonly known as: ZOFRAN-ODT Take 4 mg by mouth every 8 (eight) hours as needed.   pantoprazole 40 MG tablet Commonly known as: PROTONIX Take 40 mg by mouth daily.   sucralfate 1  GM/10ML suspension Commonly known as: CARAFATE Take 1 g by mouth 3 (three) times daily.        Follow-up Information     Christen Butter, NP Follow up in 1 week(s).   Specialty: Nurse Practitioner Contact information: [address] [place] [address] [phone]                Allergies  Allergen Reactions   Shingrix [Zoster Vac Recomb Adjuvanted] Nausea And Vomiting    High fever   Belsomra [Suvorexant] Other (See Comments)    paresthesias   Codeine Nausea Only   Penicillins Rash    Consultations: GI   Procedures/Studies: CT ABDOMEN PELVIS W CONTRAST  Result Date: 05/26/2022 CLINICAL DATA:  Gastric bypass surgery on April 30, 2022, presenting with intermittent left lower quadrant pain. EXAM: CT ABDOMEN AND PELVIS WITH CONTRAST TECHNIQUE: Multidetector CT imaging of the abdomen and pelvis was performed using the standard protocol following bolus administration of intravenous contrast. RADIATION DOSE REDUCTION: This exam was performed according to the departmental dose-optimization program which includes automated exposure control, adjustment of the mA and/or kV according to patient size and/or use of iterative reconstruction technique. CONTRAST:  OMNIPAQUE IOHEXOL 300 MG/ML  SOLN COMPARISON:  None Available. FINDINGS: Lower chest: No acute abnormality. Hepatobiliary: No focal liver abnormality is seen. No gallstones, gallbladder wall thickening, or biliary dilatation. Pancreas: Unremarkable. No pancreatic ductal dilatation or surrounding inflammatory changes. Spleen: Normal in size without focal abnormality. Adrenals/Urinary Tract: Adrenal glands are unremarkable. Kidneys are normal, without renal calculi, focal lesion, or hydronephrosis. Bladder is unremarkable. Stomach/Bowel: Multiple surgical sutures are seen within the gastric region. Surgically anastomosed bowel is also seen within the anterior aspect of the mid to upper left abdomen. 6.5 cm x 3.5  cm x 4.5 cm and 8.8 cm x 4.7 cm x 3.4 cm areas of ill-defined heterogeneous increased attenuation (approximately 52.62 Hounsfield units) are seen within the lumen of the gastric body and gastric antrum. Extension into the duodenal bulb is noted. A similar appearing 2.5 cm x 4.1 cm x 3.8 cm area of increased attenuation (approximately 48.00 Hounsfield units) is noted within the lumen of the proximal duodenum. Appendix appears normal. No evidence of bowel wall thickening, distention, or inflammatory changes. Vascular/Lymphatic: Aortic atherosclerosis. No enlarged abdominal or pelvic lymph nodes. Reproductive: Uterus and bilateral adnexa are unremarkable. Other: No abdominal wall hernia or abnormality. No abdominopelvic ascites. Musculoskeletal: No acute or significant osseous findings. IMPRESSION: 1. Findings consistent with prior gastric bypass surgery with a large amount of evolving blood products seen within the stomach and proximal duodenum. Follow-up with endoscopy is recommended to exclude the presence of an underlying neoplastic process. 2. Aortic atherosclerosis. Aortic Atherosclerosis (ICD10-I70.0). Electronically Signed   By: Aram Candela M.D.   On: 05/26/2022 00:18     Discharge Exam: Vitals:   05/27/22 2011 05/28/22 0423  BP: (!) 156/79 130/76  Pulse: 93 83  Resp: 16 16  Temp: 100 F (37.8 C) 99.4 F (37.4 C)  SpO2:  93% 93%   Vitals:   05/27/22 1340 05/27/22 1634 05/27/22 2011 05/28/22 0423  BP: (!) 156/74 (!) 152/84 (!) 156/79 130/76  Pulse: 87 94 93 83  Resp: 16 18 16 16   Temp: 99.2 F (37.3 C) 99.5 F (37.5 C) 100 F (37.8 C) 99.4 F (37.4 C)  TempSrc: Oral Oral Oral Oral  SpO2: 99% 96% 93% 93%  Weight:      Height:        General: Pt is alert, awake, not in acute distress Cardiovascular: RRR, S1/S2 +, no rubs, no gallops Respiratory: CTA bilaterally, no wheezing, no rhonchi Abdominal: Soft, NT, ND, bowel sounds + Extremities: no edema, no cyanosis    The  results of significant diagnostics from this hospitalization (including imaging, microbiology, ancillary and laboratory) are listed below for reference.     Microbiology: No results found for this or any previous visit (from the past 240 hour(s)).   Labs: BNP (last 3 results) No results for input(s): "BNP" in the last 8760 hours. Basic Metabolic Panel: Recent Labs  Lab 05/25/22 2243 05/26/22 0340 05/27/22 0523 05/28/22 0444  NA 140 139 140 139  K 3.9 3.5 3.5 3.2*  CL 108 107 107 105  CO2 21* 24 26 28   GLUCOSE 102* 151* 135* 102*  BUN 24* 23* 15 18  CREATININE 0.79 0.59 0.57 0.61  CALCIUM 9.3 8.4* 9.1 8.7*  MG  --  2.0 2.0 2.1  PHOS  --  3.8  --   --    Liver Function Tests: Recent Labs  Lab 05/25/22 2243 05/26/22 0340  AST 24 17  ALT 18 18  ALKPHOS 76 69  BILITOT 0.3 0.3  PROT 7.0 6.5  ALBUMIN 3.7 3.5   Recent Labs  Lab 05/25/22 2243  LIPASE 28   No results for input(s): "AMMONIA" in the last 168 hours. CBC: Recent Labs  Lab 05/25/22 2243 05/26/22 0340 05/26/22 0932 05/26/22 1546 05/26/22 2124 05/27/22 0523 05/27/22 1702 05/28/22 0444  WBC 12.8* 12.8*  --   --   --  18.1*  --  19.3*  NEUTROABS  --   --   --   --   --  15.4*  --  15.2*  HGB 8.2* 7.4*   < > 7.4* 7.5* 7.1* 8.5* 8.2*  HCT 26.5* 25.0*   < > 24.9* 25.9* 23.6* 28.6* 27.3*  MCV 83.1 86.2  --   --   --  85.2  --  83.0  PLT 484* 396  --   --   --  423*  --  391   < > = values in this interval not displayed.   Cardiac Enzymes: No results for input(s): "CKTOTAL", "CKMB", "CKMBINDEX", "TROPONINI" in the last 168 hours. BNP: Invalid input(s): "POCBNP" CBG: Recent Labs  Lab 05/26/22 2322  GLUCAP 136*   D-Dimer No results for input(s): "DDIMER" in the last 72 hours. Hgb A1c No results for input(s): "HGBA1C" in the last 72 hours. Lipid Profile No results for input(s): "CHOL", "HDL", "LDLCALC", "TRIG", "CHOLHDL", "LDLDIRECT" in the last 72 hours. Thyroid function studies No results for  input(s): "TSH", "T4TOTAL", "T3FREE", "THYROIDAB" in the last 72 hours.  Invalid input(s): "FREET3" Anemia work up No results for input(s): "VITAMINB12", "FOLATE", "FERRITIN", "TIBC", "IRON", "RETICCTPCT" in the last 72 hours. Urinalysis    Component Value Date/Time   COLORURINE YELLOW 05/26/2022 0119   APPEARANCEUR CLEAR 05/26/2022 0119   LABSPEC 1.015 05/26/2022 0119   PHURINE 6.0 05/26/2022 0119   GLUCOSEU NEGATIVE  05/26/2022 0119   HGBUR NEGATIVE 05/26/2022 0119   BILIRUBINUR NEGATIVE 05/26/2022 0119   BILIRUBINUR negative 04/24/2016 1839   KETONESUR 15 (A) 05/26/2022 0119   PROTEINUR NEGATIVE 05/26/2022 0119   UROBILINOGEN 0.2 04/24/2016 1839   NITRITE NEGATIVE 05/26/2022 0119   LEUKOCYTESUR NEGATIVE 05/26/2022 0119   Sepsis Labs Recent Labs  Lab 05/25/22 2243 05/26/22 0340 05/27/22 0523 05/28/22 0444  WBC 12.8* 12.8* 18.1* 19.3*   Microbiology No results found for this or any previous visit (from the past 240 hour(s)).   Time coordinating discharge: Over 30 minutes  SIGNED:   Hughie Closs, MD  [facility] 05/28/2022, 12:28 PM *Please note that this is a verbal dictation therefore any spelling or grammatical errors are due to the "[employer]agon [employer] One" system interpretation. If 7PM-7AM, please contact night-coverage [email or web address]

## 2022-05-28 NOTE — Progress Notes (Signed)
AVS and discharge instructions reviewed w/ patient and spouse at the bedside. Patient and spouse verbalized understanding and had no further questions.

## 2022-05-28 NOTE — Progress Notes (Signed)
Received report from off going nurse. Introduced self to patient. Needs addressed. Patient belongings and call light within reach.

## 2022-05-28 NOTE — Plan of Care (Signed)
  Problem: Health Behavior/Discharge Planning: Goal: Ability to manage health-related needs will improve Outcome: Progressing   Problem: Clinical Measurements: Goal: Ability to maintain clinical measurements within normal limits will improve Outcome: Progressing Goal: Will remain free from infection Outcome: Progressing Goal: Diagnostic test results will improve Outcome: Progressing   

## 2022-05-29 ENCOUNTER — Encounter (HOSPITAL_COMMUNITY): Payer: Self-pay | Admitting: Gastroenterology

## 2022-05-30 ENCOUNTER — Encounter: Payer: Self-pay | Admitting: Family

## 2022-05-30 ENCOUNTER — Inpatient Hospital Stay: Payer: 59 | Attending: Family | Admitting: Family

## 2022-05-30 ENCOUNTER — Telehealth: Payer: Self-pay | Admitting: General Practice

## 2022-05-30 ENCOUNTER — Inpatient Hospital Stay: Payer: 59

## 2022-05-30 ENCOUNTER — Encounter: Payer: Self-pay | Admitting: Medical-Surgical

## 2022-05-30 VITALS — BP 116/60 | HR 84 | Temp 98.3°F | Resp 20 | Ht 64.0 in | Wt 209.0 lb

## 2022-05-30 DIAGNOSIS — K922 Gastrointestinal hemorrhage, unspecified: Secondary | ICD-10-CM | POA: Insufficient documentation

## 2022-05-30 DIAGNOSIS — Z9884 Bariatric surgery status: Secondary | ICD-10-CM

## 2022-05-30 DIAGNOSIS — Z79899 Other long term (current) drug therapy: Secondary | ICD-10-CM | POA: Diagnosis not present

## 2022-05-30 DIAGNOSIS — K909 Intestinal malabsorption, unspecified: Secondary | ICD-10-CM | POA: Insufficient documentation

## 2022-05-30 DIAGNOSIS — K921 Melena: Secondary | ICD-10-CM | POA: Diagnosis not present

## 2022-05-30 DIAGNOSIS — R109 Unspecified abdominal pain: Secondary | ICD-10-CM | POA: Diagnosis not present

## 2022-05-30 DIAGNOSIS — D508 Other iron deficiency anemias: Secondary | ICD-10-CM | POA: Diagnosis present

## 2022-05-30 DIAGNOSIS — R1012 Left upper quadrant pain: Secondary | ICD-10-CM | POA: Diagnosis not present

## 2022-05-30 LAB — CBC WITH DIFFERENTIAL (CANCER CENTER ONLY)
Abs Immature Granulocytes: 0.05 10*3/uL (ref 0.00–0.07)
Basophils Absolute: 0.1 10*3/uL (ref 0.0–0.1)
Basophils Relative: 1 %
Eosinophils Absolute: 0.1 10*3/uL (ref 0.0–0.5)
Eosinophils Relative: 1 %
HCT: 28.3 % — ABNORMAL LOW (ref 36.0–46.0)
Hemoglobin: 8.4 g/dL — ABNORMAL LOW (ref 12.0–15.0)
Immature Granulocytes: 0 %
Lymphocytes Relative: 18 %
Lymphs Abs: 2.2 10*3/uL (ref 0.7–4.0)
MCH: 24.3 pg — ABNORMAL LOW (ref 26.0–34.0)
MCHC: 29.7 g/dL — ABNORMAL LOW (ref 30.0–36.0)
MCV: 82 fL (ref 80.0–100.0)
Monocytes Absolute: 0.8 10*3/uL (ref 0.1–1.0)
Monocytes Relative: 6 %
Neutro Abs: 9.1 10*3/uL — ABNORMAL HIGH (ref 1.7–7.7)
Neutrophils Relative %: 74 %
Platelet Count: 250 10*3/uL (ref 150–400)
RBC: 3.45 MIL/uL — ABNORMAL LOW (ref 3.87–5.11)
RDW: 17.7 % — ABNORMAL HIGH (ref 11.5–15.5)
WBC Count: 12.4 10*3/uL — ABNORMAL HIGH (ref 4.0–10.5)
nRBC: 0 % (ref 0.0–0.2)

## 2022-05-30 LAB — FERRITIN: Ferritin: 41 ng/mL (ref 11–307)

## 2022-05-30 NOTE — Progress Notes (Unsigned)
Hematology and Oncology Follow Up Visit  Meagan Floyd [id number] 1966/09/26 56 y.o. 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 She has actually been hospitalized several times over the last 2 months for abdominal pain and gastric hemorrhage.  She had an EGD during admission whish showed friable mucosa noted at the gastrojejunal anastomosis site with previous Roux-en-Y gastrojejunostomy.  She is still having dark tarry stools since her discharge from the hospital 2 days ago.  No bright red blood noted. No abnormal bruising, no petechiae.  She still has intermittent left upper quadrant abdominal pain and pain when she tries to eat.  She is currently taking Protonix twice daily and Carafate TID.   She will be following up with her gastroenterologist at [facility] this month. She may also benefit from a colonoscopy.  She has noted phlebitis at the right anti cubital and left hand IV sites. No streaking or edema noted. No pain these sites. She will try cold compresses and hydrocortisone cream to treat. If anything changes she will let up know. We discussed s/s of infection to watch for.  No fever, chills, vomiting, cough, rash, dizziness, chest pain, palpitations or changes in bowel or bladder habits.  No swelling, tenderness, numbness or tingling in her extremities.  No falls or syncope.  She is doing her best to stay well hydrated. Her weight is 209 lbs.   ECOG Performance Status: 1 - Symptomatic but completely ambulatory  Medications:  Allergies as of 05/30/2022       Reactions   Shingrix [zoster Vac Recomb Adjuvanted] Nausea And Vomiting   High fever   Belsomra [suvorexant] Other (See Comments)   paresthesias   Codeine Nausea Only   Penicillins Rash         Medication List        Accurate as of May 30, 2022  4:16 PM. If you have any questions, ask your nurse or doctor.          DULoxetine 60 MG capsule Commonly known as: CYMBALTA TAKE 1 CAPSULE BY MOUTH EVERY DAY What changed:  how much to take how to take this when to take this additional instructions   ondansetron 4 MG disintegrating tablet Commonly known as: ZOFRAN-ODT Take 4 mg by mouth every 8 (eight) hours as needed.   pantoprazole 40 MG tablet Commonly known as: PROTONIX Take 40 mg by mouth daily.   sucralfate 1 GM/10ML suspension Commonly known as: CARAFATE Take 1 g by mouth 3 (three) times daily.        Allergies:  Allergies  Allergen Reactions   Shingrix [Zoster Vac Recomb Adjuvanted] Nausea And Vomiting    High fever   Belsomra [Suvorexant] Other (See Comments)    paresthesias   Codeine Nausea Only   Penicillins Rash    Past Medical History, Surgical history, Social history, and Family History were reviewed and updated.  Review of Systems: All other 10 point review of systems is negative.   Physical Exam:  height is 5\' 4"  (1.626 m) and weight is 209 lb (94.8 kg). Her oral temperature is 98.3 F (36.8 C). Her blood pressure is 116/60 and her pulse is 84. Her respiration is 20 and oxygen saturation is 100%.   Wt Readings from Last  3 Encounters:  05/30/22 209 lb (94.8 kg)  05/27/22 213 lb 13.5 oz (97 kg)  05/13/22 212 lb (96.2 kg)    Ocular: Sclerae unicteric, pupils equal, round and reactive to light Ear-nose-throat: Oropharynx clear, dentition fair Lymphatic: No cervical or supraclavicular adenopathy Lungs no rales or rhonchi, good excursion bilaterally Heart regular rate and rhythm, no murmur appreciated Abd soft, nontender, positive bowel sounds MSK no focal spinal tenderness, no joint edema Neuro: non-focal, well-oriented, appropriate affect Breasts: Deferred   Lab Results  Component Value Date   WBC 12.4 (H) 05/30/2022   HGB  8.4 (L) 05/30/2022   HCT 28.3 (L) 05/30/2022   MCV 82.0 05/30/2022   PLT 250 05/30/2022   Lab Results  Component Value Date   FERRITIN 44 01/08/2022   IRON 57 01/08/2022   TIBC 367 01/08/2022   UIBC 310 01/08/2022   IRONPCTSAT 16 01/08/2022   Lab Results  Component Value Date   RETICCTPCT 1.1 01/08/2022   RBC 3.45 (L) 05/30/2022   RETICCTABS 46,700 08/29/2020   No results found for: "KPAFRELGTCHN", "LAMBDASER", "KAPLAMBRATIO" No results found for: "IGGSERUM", "IGA", "IGMSERUM" No results found for: "TOTALPROTELP", "ALBUMINELP", "A1GS", "A2GS", "BETS", "BETA2SER", "GAMS", "MSPIKE", "SPEI"   Chemistry      Component Value Date/Time   NA 139 05/28/2022 0444   K 3.2 (L) 05/28/2022 0444   CL 105 05/28/2022 0444   CO2 28 05/28/2022 0444   BUN 18 05/28/2022 0444   CREATININE 0.61 05/28/2022 0444   CREATININE 0.70 01/08/2022 1110   CREATININE 0.83 11/26/2021 0000      Component Value Date/Time   CALCIUM 8.7 (L) 05/28/2022 0444   ALKPHOS 69 05/26/2022 0340   AST 17 05/26/2022 0340   AST 17 01/08/2022 1110   ALT 18 05/26/2022 0340   ALT 17 01/08/2022 1110   BILITOT 0.3 05/26/2022 0340   BILITOT 0.4 01/08/2022 1110       Impression and Plan: Meagan Floyd is a very pleasant 56 yo caucasian female with 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 Her iron saturation at this time is only 2% and ferritin 41.  We will get her set up for 3 doses of IV iron.  Follow-up in 4 weeks.   Eileen Stanford, NP 9/1/20234:16 PM

## 2022-05-30 NOTE — Telephone Encounter (Signed)
Transition Care Management Unsuccessful Follow-up Telephone Call  Date of discharge and from where:  05/28/22 from [facility]  Attempts:  1st Attempt  Reason for unsuccessful TCM follow-up call:  Left voice message

## 2022-06-03 ENCOUNTER — Ambulatory Visit: Payer: 59

## 2022-06-03 ENCOUNTER — Encounter: Payer: Self-pay | Admitting: Family

## 2022-06-03 ENCOUNTER — Telehealth: Payer: Self-pay | Admitting: *Deleted

## 2022-06-03 LAB — IRON AND IRON BINDING CAPACITY (CC-WL,HP ONLY)
Iron: 8 ug/dL — ABNORMAL LOW (ref 28–170)
Saturation Ratios: 2 % — ABNORMAL LOW (ref 10.4–31.8)
TIBC: 377 ug/dL (ref 250–450)
UIBC: 369 ug/dL (ref 148–442)

## 2022-06-03 NOTE — Telephone Encounter (Signed)
Per scheduling message Maralyn Sago (3) doses of IV Iron - scheduled and confirmed

## 2022-06-03 NOTE — Telephone Encounter (Signed)
Arline Asp,  Can you please reroute this to a [facility] bariatrics provider per patient request? If possible, change to urgent since she needs attention ASAP (multiple recent hospital admissions with GI bleeding, etc). Thanks, Ander Slade

## 2022-06-03 NOTE — Telephone Encounter (Signed)
Per scheduling message Sarah - Called and lvm for a call back to schedule (3) doses of IV Iron. 

## 2022-06-04 NOTE — Telephone Encounter (Signed)
Transition Care Management Unsuccessful Follow-up Telephone Call  Date of discharge and from where:  05/28/22 from [facility]  Attempts:  2nd Attempt  Reason for unsuccessful TCM follow-up call:  No answer/busy

## 2022-06-05 ENCOUNTER — Inpatient Hospital Stay: Payer: 59

## 2022-06-05 VITALS — BP 140/78 | HR 73 | Temp 98.0°F | Resp 16

## 2022-06-05 DIAGNOSIS — D508 Other iron deficiency anemias: Secondary | ICD-10-CM | POA: Diagnosis not present

## 2022-06-05 DIAGNOSIS — K912 Postsurgical malabsorption, not elsewhere classified: Secondary | ICD-10-CM

## 2022-06-05 MED ORDER — SODIUM CHLORIDE 0.9 % IV SOLN
300.0000 mg | Freq: Once | INTRAVENOUS | Status: AC
  Administered 2022-06-05: 300 mg via INTRAVENOUS
  Filled 2022-06-05: qty 300

## 2022-06-05 MED ORDER — SODIUM CHLORIDE 0.9 % IV SOLN: Freq: Once | INTRAVENOUS | Status: AC

## 2022-06-05 NOTE — Patient Instructions (Signed)

## 2022-06-06 ENCOUNTER — Encounter: Payer: Self-pay | Admitting: Medical-Surgical

## 2022-06-06 ENCOUNTER — Ambulatory Visit (INDEPENDENT_AMBULATORY_CARE_PROVIDER_SITE_OTHER): Payer: 59 | Admitting: Medical-Surgical

## 2022-06-06 VITALS — BP 114/75 | HR 89 | Resp 20 | Ht 64.0 in | Wt 209.5 lb

## 2022-06-06 DIAGNOSIS — Z09 Encounter for follow-up examination after completed treatment for conditions other than malignant neoplasm: Secondary | ICD-10-CM | POA: Diagnosis not present

## 2022-06-06 DIAGNOSIS — K922 Gastrointestinal hemorrhage, unspecified: Secondary | ICD-10-CM | POA: Diagnosis not present

## 2022-06-06 DIAGNOSIS — Z9884 Bariatric surgery status: Secondary | ICD-10-CM | POA: Diagnosis not present

## 2022-06-06 LAB — HM MAMMOGRAPHY

## 2022-06-06 NOTE — Telephone Encounter (Signed)
Transition Care Management Unsuccessful Follow-up Telephone Call  Date of discharge and from where:  05/28/22 from [facility]  Attempts:  3rd Attempt  Reason for unsuccessful TCM follow-up call:  No answer/busy

## 2022-06-06 NOTE — Progress Notes (Signed)
   Established Patient Office Visit  Subjective   Patient ID: Meagan Floyd, female   DOB: 1966-09-25 Age: 56 y.o. MRN: [id number]   Chief Complaint  Patient presents with  . Hospitalization Follow-up    HPI    Objective:    Vitals:   06/06/22 1622  BP: 114/75  Pulse: 89  Resp: 20  Height: 5\' 4"  (1.626 m)  Weight: 209 lb 8 oz (95 kg)  SpO2: 100%  BMI (Calculated): 35.94    Physical Exam   No results found for this or any previous visit (from the past 24 hour(s)).   {Labs (Optional):23779}  The ASCVD Risk score (Arnett DK, et al., 2019) failed to calculate for the following reasons:   Cannot find a previous HDL lab   Cannot find a previous total cholesterol lab   Assessment & Plan:   No problem-specific Assessment & Plan notes found for this encounter.   No follow-ups on file.  ___________________________________________ 2020, DNP, APRN, FNP-BC Primary Care and Sports Medicine [facility]

## 2022-06-12 ENCOUNTER — Inpatient Hospital Stay: Payer: 59

## 2022-06-13 ENCOUNTER — Telehealth: Payer: Self-pay

## 2022-06-13 NOTE — Telephone Encounter (Signed)
Patient called to let you know that she was admitted to the hospital yesterday and had to cancel the appointment that she had today with the Bariatric General Surgeon.   The doctor at the hospital stated that she needs to get rescheduled with that surgeon ASAP and she wanted to know if there is any way that you can help her get rescheduled.

## 2022-06-13 NOTE — Telephone Encounter (Signed)
I received a note from Dr. Andrey Campanile with [facility] surgery regarding the patient not showing for her appointment today.  He has reviewed her case extensively and provided some information regarding her history.  He did mention that she should follow-up with Dr. Clent Ridges with [facility] and her prior bariatric surgeon.  I have responded with the message requesting facilitation of rescheduling her for an evaluation with [facility] surgery as she was previously unhappy with her prior services with bariatrics and GI.  She had requested to be connected with [facility] providers over those affiliated.  Pending reply. Thanks, Ander Slade

## 2022-06-14 ENCOUNTER — Encounter: Payer: Self-pay | Admitting: Medical-Surgical

## 2022-06-16 NOTE — Telephone Encounter (Signed)
Attempted to contact patient. Patient's husband answered and stated that at the time of the call patient was at hospital and  coming out of anesthesia after colonoscopy . Informed husband that I would call back at a later time as this was not a good time for this call.

## 2022-06-19 ENCOUNTER — Inpatient Hospital Stay: Payer: 59

## 2022-06-19 NOTE — Telephone Encounter (Signed)
Left a brief voicemail for patient to return call back to office regarding provider's note/recommendation. Direct call back info provided.

## 2022-06-23 ENCOUNTER — Telehealth: Payer: Self-pay | Admitting: General Practice

## 2022-06-23 NOTE — Telephone Encounter (Signed)
Transition Care Management Unsuccessful Follow-up Telephone Call  Date of discharge and from where:  06/21/22 from [facility]   Attempts:  1st Attempt  Reason for unsuccessful TCM follow-up call:  Left voice message

## 2022-06-25 NOTE — Telephone Encounter (Signed)
Transition Care Management Unsuccessful Follow-up Telephone Call  Date of discharge and from where:  06/21/22 from [facility]  Attempts:  2nd Attempt  Reason for unsuccessful TCM follow-up call:  Left voice message

## 2022-06-26 ENCOUNTER — Inpatient Hospital Stay: Payer: 59

## 2022-06-27 NOTE — Telephone Encounter (Signed)
Transition Care Management Unsuccessful Follow-up Telephone Call  Date of discharge and from where:  06/21/22 from [facility]  Attempts:  3rd Attempt  Reason for unsuccessful TCM follow-up call:  Left voice message

## 2022-07-01 ENCOUNTER — Inpatient Hospital Stay: Payer: 59 | Admitting: Family

## 2022-07-01 ENCOUNTER — Inpatient Hospital Stay: Payer: 59 | Attending: Hematology & Oncology

## 2022-07-11 ENCOUNTER — Ambulatory Visit: Payer: 59 | Admitting: Family

## 2022-07-11 ENCOUNTER — Other Ambulatory Visit: Payer: 59

## 2022-08-08 ENCOUNTER — Ambulatory Visit (INDEPENDENT_AMBULATORY_CARE_PROVIDER_SITE_OTHER): Payer: 59 | Admitting: Physician Assistant

## 2022-08-08 ENCOUNTER — Encounter: Payer: Self-pay | Admitting: Physician Assistant

## 2022-08-08 VITALS — BP 143/85 | HR 86 | Ht 64.0 in | Wt 208.1 lb

## 2022-08-08 DIAGNOSIS — M7989 Other specified soft tissue disorders: Secondary | ICD-10-CM | POA: Diagnosis not present

## 2022-08-08 DIAGNOSIS — S99921D Unspecified injury of right foot, subsequent encounter: Secondary | ICD-10-CM | POA: Diagnosis not present

## 2022-08-08 DIAGNOSIS — S99921A Unspecified injury of right foot, initial encounter: Secondary | ICD-10-CM | POA: Insufficient documentation

## 2022-08-08 DIAGNOSIS — M79671 Pain in right foot: Secondary | ICD-10-CM | POA: Diagnosis not present

## 2022-08-08 NOTE — Progress Notes (Signed)
Acute Office Visit  Subjective:     Patient ID: Meagan Floyd, female    DOB: 1966/04/27, 56 y.o.   MRN: [id number]  Chief Complaint  Patient presents with   Foot Injury    HPI Patient is in today for right midfoot pain since she feel in September in the shower. She had a GI bleed and ended up having surgery. She did have xray in hospital which was negative for fracture. The bruising and swelling has gotten some better but her foot continues to hurt when she walks and swollen by the end of the day. Tylenol does help some with pain.   .. 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[place]) 08/14/2017   Chronic iron deficiency anemia 09/18/2017   Intestinal malabsorption following gastrectomy 03/13/2015   Pseudotumor cerebri 02/04/2018   Obesity (BMI 30-39.9) 08/29/2015   Left shoulder pain 11/11/2013   Generalized anxiety disorder with panic attacks 07/13/2018   Caregiver stress 08/10/2018   Vitamin A deficiency 03/13/2019   Secondary hyperparathyroidism, non-renal ([place]) 03/13/2019   Dysphagia 04/13/2019   Globus sensation 04/13/2019   Tachycardia with heart rate 100-120 beats per minute 04/13/2019   Postmenopausal 04/13/2019   DOE (dyspnea on exertion) 04/13/2019   IDA (iron deficiency anemia) 04/25/2019   History of gastric bypass 04/25/2019   Malabsorption of iron 04/25/2019   Ulcer, anastomotic 03/25/2022   GI bleed 04/30/2022   Morbid obesity due to excess calories ([place]) 09/04/2014   ABLA (acute blood loss anemia) 05/26/2022   Anxiety and depression 05/26/2022   Swelling of right foot 08/08/2022   Pain of midfoot, right 08/08/2022   Injury of right foot 08/08/2022   Resolved Ambulatory Problems    Diagnosis  Date Noted   UPPER RESPIRATORY INFECTION, ACUTE 08/31/2010   COSTOCHONDRITIS, ACUTE 08/31/2010   Microcytic anemia 08/11/2017   Psychological factors affecting morbid obesity ([place]) 07/18/2014   Past Medical History:  Diagnosis Date   Anemia    Chronic headache    Depression    Depression    Hypertension    Obesity      ROS See HPI.      Objective:    BP (!) 143/85   Pulse 86   Ht _0  (1.626 m)   Wt 208 lb 1.6 oz (94.4 kg)   SpO2 96%   BMI 35.72 kg/m  BP Readings from Last 3 Encounters:  08/08/22 (!) 143/85  06/06/22 114/75  06/05/22 (!) 140/78   Wt Readings from Last 3 Encounters:  08/08/22 208 lb 1.6 oz (94.4 kg)  06/06/22 209 lb 8 oz (95 kg)  05/30/22 209 lb (94.8 kg)      Physical Exam Musculoskeletal:     Comments: NROM of right foot Pain with dorsi-flexion Tenderness over medial mid-foot to palpation with some mild warmth and swelling Strength 5/5  2+ bilateral pedal pulses    Neurological:     General: No focal deficit present.  Psychiatric:        Mood and Affect: Mood normal.          Assessment & Plan:  Marland KitchenMarland KitchenMadelline was seen today for foot injury.  Diagnoses and all orders for this visit:  Pain of midfoot, right -  MR FOOT RIGHT WO CONTRAST; Future  Injury of right foot, subsequent encounter -     MR FOOT RIGHT WO CONTRAST; Future   Prior normal xray 9/14 Persistent pain and swelling Placed in post op shoe Use voltaren gel due to hx of GI bleed cannot tolerate NSAIds Use tylenol for pain Ice as needed Will get MRI to better evaluate  Iran Planas, PA-C

## 2022-08-08 NOTE — Patient Instructions (Signed)
Wear post op shoe Use voltaren gel up to 4 times a day Use ice to help with pain and swelling Ok to continue using tylenol Ordered MRI

## 2022-08-08 NOTE — Progress Notes (Signed)
foot

## 2022-09-02 ENCOUNTER — Telehealth: Payer: Self-pay

## 2022-09-02 NOTE — Telephone Encounter (Signed)
Patient seen Dr. Monia Sabal, Tele Doc on 08/28/2022  She was diagnosed with a sinus infection and they prescribed her Bactrim 1 tablet twice daily. She took 2 days worth and stopped taking the medication due to causing extreme headaches.  She contacted them to see if they can send her in something and they stated that she needed to contact her PCP.

## 2022-09-04 ENCOUNTER — Telehealth (INDEPENDENT_AMBULATORY_CARE_PROVIDER_SITE_OTHER): Payer: 59 | Admitting: Medical-Surgical

## 2022-09-04 ENCOUNTER — Encounter: Payer: Self-pay | Admitting: Medical-Surgical

## 2022-09-04 DIAGNOSIS — J0140 Acute pansinusitis, unspecified: Secondary | ICD-10-CM

## 2022-09-04 MED ORDER — AZITHROMYCIN 250 MG PO TABS: ORAL_TABLET | ORAL | 0 refills | Status: AC

## 2022-09-04 NOTE — Telephone Encounter (Signed)
Rescheduled patient to have a virtual visit with Christen Butter today at 2:30 PM.

## 2022-09-04 NOTE — Progress Notes (Signed)
Virtual Visit via Video Note  I connected with Meagan Floyd on 09/04/22 at  2:30 PM EST by a video enabled telemedicine application and verified that I am speaking with the correct person using two identifiers.   I discussed the limitations of evaluation and management by telemedicine and the availability of in person appointments. The patient expressed understanding and agreed to proceed.  Patient location: home Provider locations: office  Subjective:    CC: Sinus infection  HPI: Very pleasant 56 year old female presenting today via MyChart video visit.  She was seen by a TeleDoc several days ago and prescribed Bactrim for a sinus infection.  She took 2 days of the Bactrim prescription however noted that she developed a severe headache that seem to be related to the dosing times.  Once the medication was working out of her system and it was time for the next dose, the headache was better but shortly after taking the dose, it started again.  She stopped taking the medication and reached out to [place] to have something different prescribed.  Reports that she has had no fevers or chills but she has sinus congestion, right maxillary/ethmoidal/frontal sinus pain and tenderness.  Notes that she also has started to have some dental pain.  Has nasal discharge that is thick and yellowish-green.  Has had a few mild nosebleeds over the last several days.  Past medical history, Surgical history, Family history not pertinant except as noted below, Social history, Allergies, and medications have been entered into the medical record, reviewed, and corrections made.   Review of Systems: See HPI for pertinent positives and negatives.   Objective:    General: Speaking clearly in complete sentences without any shortness of breath.  Alert and oriented x3.  Normal judgment. No apparent acute distress.  Impression and Recommendations:    1. Acute non-recurrent pansinusitis Discontinue Bactrim.  Start  azithromycin 500 mg today with 250 mg daily for the next 4 days.  Okay to use Tylenol for discomfort.  Consider nasal saline spray or Mucinex to help thin secretions.  I discussed the assessment and treatment plan with the patient. The patient was provided an opportunity to ask questions and all were answered. The patient agreed with the plan and demonstrated an understanding of the instructions.   The patient was advised to call back or seek an in-person evaluation if the symptoms worsen or if the condition fails to improve as anticipated.  20 minutes of non-face-to-face time was provided during this encounter.  Return if symptoms worsen or fail to improve.  Thayer Ohm, DNP, APRN, FNP-BC [facility] and Sports Medicine

## 2022-09-06 ENCOUNTER — Ambulatory Visit (INDEPENDENT_AMBULATORY_CARE_PROVIDER_SITE_OTHER): Payer: 59

## 2022-09-06 DIAGNOSIS — S99921D Unspecified injury of right foot, subsequent encounter: Secondary | ICD-10-CM

## 2022-09-06 DIAGNOSIS — M79671 Pain in right foot: Secondary | ICD-10-CM

## 2022-09-08 ENCOUNTER — Telehealth: Payer: Self-pay

## 2022-09-08 ENCOUNTER — Encounter: Payer: Self-pay | Admitting: Medical-Surgical

## 2022-09-09 ENCOUNTER — Encounter: Payer: Self-pay | Admitting: Family

## 2022-09-09 ENCOUNTER — Telehealth: Payer: 59 | Admitting: Medical-Surgical

## 2022-09-09 ENCOUNTER — Other Ambulatory Visit: Payer: Self-pay | Admitting: Physician Assistant

## 2022-09-09 DIAGNOSIS — S99921D Unspecified injury of right foot, subsequent encounter: Secondary | ICD-10-CM

## 2022-09-09 DIAGNOSIS — S92344G Nondisplaced fracture of fourth metatarsal bone, right foot, subsequent encounter for fracture with delayed healing: Secondary | ICD-10-CM | POA: Insufficient documentation

## 2022-09-09 DIAGNOSIS — M79671 Pain in right foot: Secondary | ICD-10-CM

## 2022-09-09 DIAGNOSIS — S93324D Dislocation of tarsometatarsal joint of right foot, subsequent encounter: Secondary | ICD-10-CM

## 2022-09-09 DIAGNOSIS — S93622D Sprain of tarsometatarsal ligament of left foot, subsequent encounter: Secondary | ICD-10-CM | POA: Insufficient documentation

## 2022-09-09 NOTE — Telephone Encounter (Signed)
Completed.

## 2022-09-09 NOTE — Progress Notes (Signed)
Lots of reason for pain and swelling. You have severe ligament strain with partial tear. You have a distal fracture of the 4th toe. You have lots of swelling and bone bruising. Continue in post op shoe. I am placing urgent referral downstairs for podiatry consult.

## 2022-09-18 ENCOUNTER — Ambulatory Visit (INDEPENDENT_AMBULATORY_CARE_PROVIDER_SITE_OTHER): Payer: 59

## 2022-09-18 DIAGNOSIS — S93621A Sprain of tarsometatarsal ligament of right foot, initial encounter: Secondary | ICD-10-CM

## 2022-09-19 ENCOUNTER — Ambulatory Visit (INDEPENDENT_AMBULATORY_CARE_PROVIDER_SITE_OTHER): Payer: 59 | Admitting: Podiatry

## 2022-09-19 DIAGNOSIS — S93621A Sprain of tarsometatarsal ligament of right foot, initial encounter: Secondary | ICD-10-CM

## 2022-09-19 DIAGNOSIS — S92001A Unspecified fracture of right calcaneus, initial encounter for closed fracture: Secondary | ICD-10-CM

## 2022-09-19 NOTE — Progress Notes (Signed)
Subjective:   Patient ID: Meagan Floyd, female   DOB: 56 y.o.   MRN: [id number]   HPI Chief Complaint  Patient presents with   Foot Injury    Patient fell in September 2023 , pain has been constant and throbbing.   DOI: 06/14/2022  56 year old female presents the office today for concerns of right foot injury with the date of injury above.  She said that she passed out in the shower due to internal bleeding and injured her foot.  She was seen in the emergency room and ice was applied no other treatment.  She follow-up with her primary care doctor.  She is putting a surgical shoe for about 1 month.  For the first month and a half she was not able to put weight on the foot but now she is able to walk in the boot.  She gets pain and swelling towards the end of the day.  She points on the dorsal medial aspect of foot where she gets discomfort.   Review of Systems  All other systems reviewed and are negative.  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   History of gastric bypass 04/25/2019   Hypertension    Malabsorption of iron 04/25/2019   Microcytic anemia 08/11/2017   Obesity    Secondary hyperparathyroidism, non-renal ([place]) 03/13/2019   Vitamin A deficiency 03/13/2019    Past Surgical History:  Procedure Laterality Date   ABLATION     ESOPHAGOGASTRODUODENOSCOPY (EGD) WITH PROPOFOL N/A 05/27/2022   Procedure: ESOPHAGOGASTRODUODENOSCOPY (EGD) WITH PROPOFOL;  Surgeon: Carol Ada, MD;  Location: [facility];  Service: Gastroenterology;  Laterality: N/A;   GASTRIC BYPASS     SHOULDER ARTHROSCOPY     TUMOR REMOVAL     right hand     Current Outpatient Medications:    DULoxetine (CYMBALTA) 60 MG capsule, TAKE 1 CAPSULE BY MOUTH EVERY DAY (Patient taking differently: Take 60 mg by mouth daily.), Disp: 90 capsule, Rfl: 1  Allergies  Allergen Reactions    Bactrim [Sulfamethoxazole-Trimethoprim] Other (See Comments)    Severe headache   Shingrix [Zoster Vac Recomb Adjuvanted] Nausea And Vomiting    High fever   Belsomra [Suvorexant] Other (See Comments)    paresthesias   Codeine Nausea Only   Penicillins Rash          Objective:  Physical Exam  General: AAO x3, NAD  Dermatological: Skin is warm, dry and supple bilateral.  There are no open sores, no preulcerative lesions, no rash or signs of infection present.  Vascular: Dorsalis Pedis artery and Posterior Tibial artery pedal pulses are 2/4 bilateral with immedate capillary fill time.  There is no pain with calf compression, swelling, warmth, erythema.   Neruologic: Grossly intact via light touch bilateral.   Musculoskeletal: Tenderness mostly on the dorsal medial aspect of the foot along the Lisfranc joint.  There is trace edema there is no erythema or warmth.  Flexor, extensor tendons appear to be intact.  There is pain with abduction, adduction of the foot.  Muscular strength 5/5 in all groups tested bilateral.  Gait: Unassisted, Nonantalgic.       Assessment:   Lisfranc injury right foot     Plan:  -Treatment options discussed including all alternatives, risks, and complications -Etiology of symptoms were discussed -I verbally reviewed the.  Also  new x-rays were obtained and reviewed.  Appears that the Lisfranc joint is in adequate alignment at this time.  Await radiology report -Although she is doing better she still in pain awaiting immobilization in a cam boot was dispensed today.  Continue ice, elevate as well as Tylenol she is not able to take anti-inflammatories.  We discussed the conservative as well as surgical treatment options for this but since she is improving continue conservative care.  However we discussed that if she continues to have pain or worsening displacement may need surgical intervention.  Vivi Barrack DPM

## 2022-10-20 ENCOUNTER — Ambulatory Visit: Payer: 59 | Admitting: Medical-Surgical

## 2022-10-23 ENCOUNTER — Other Ambulatory Visit: Payer: Self-pay | Admitting: Medical-Surgical

## 2022-10-24 ENCOUNTER — Ambulatory Visit: Payer: 59 | Admitting: Podiatry

## 2022-10-30 ENCOUNTER — Ambulatory Visit (INDEPENDENT_AMBULATORY_CARE_PROVIDER_SITE_OTHER): Payer: 59 | Admitting: Podiatry

## 2022-10-30 ENCOUNTER — Encounter: Payer: Self-pay | Admitting: Podiatry

## 2022-10-30 ENCOUNTER — Ambulatory Visit (INDEPENDENT_AMBULATORY_CARE_PROVIDER_SITE_OTHER): Payer: 59

## 2022-10-30 DIAGNOSIS — S93621A Sprain of tarsometatarsal ligament of right foot, initial encounter: Secondary | ICD-10-CM

## 2022-10-30 NOTE — Progress Notes (Signed)
Subjective:   Patient ID: Meagan Floyd, female   DOB: 57 y.o.   MRN: [id number]   HPI Chief Complaint  Patient presents with   Follow-up    Lisfranc sprain, right. Patient is still experiencing pain but she states it is getting better.    DOI: 06/14/2022  57 year old female presents the office today for follow-up of right foot injury on 06/14/22.  She said that she passed out in the shower due to internal bleeding and injured her foot.  She was seen in the emergency room and ice was applied no other treatment.  She follow-up with her primary care doctor.  She is putting a surgical shoe for about 1 month.  For the first month and a half she was not able to put weight on the foot but now she is able to walk in the boot. She has been in the boot for a few months. Relates still has pain but it is getting better.    Review of Systems  All other systems reviewed and are negative.  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   Microcytic anemia 08/11/2017   Obesity    Secondary hyperparathyroidism, non-renal ([place]) 03/13/2019   Vitamin A deficiency 03/13/2019    Past Surgical History:  Procedure Laterality Date   ABLATION     ESOPHAGOGASTRODUODENOSCOPY (EGD) WITH PROPOFOL N/A 05/27/2022   Procedure: ESOPHAGOGASTRODUODENOSCOPY (EGD) WITH PROPOFOL;  Surgeon: Carol Ada, MD;  Location: [facility];  Service: Gastroenterology;  Laterality: N/A;   GASTRIC BYPASS     SHOULDER ARTHROSCOPY     TUMOR REMOVAL     right hand     Current Outpatient Medications:    DULoxetine (CYMBALTA) 60 MG capsule, TAKE 1 CAPSULE BY MOUTH EVERY DAY, Disp: 90 capsule, Rfl: 0  Allergies  Allergen Reactions   Bactrim [Sulfamethoxazole-Trimethoprim] Other (See Comments)    Severe headache    Shingrix [Zoster Vac Recomb Adjuvanted] Nausea And Vomiting    High fever   Belsomra [Suvorexant] Other (See Comments)    paresthesias   Codeine Nausea Only   Penicillins Rash          Objective:  Physical Exam  General: AAO x3, NAD  Dermatological: Skin is warm, dry and supple bilateral.  There are no open sores, no preulcerative lesions, no rash or signs of infection present.  Vascular: Dorsalis Pedis artery and Posterior Tibial artery pedal pulses are 2/4 bilateral with immedate capillary fill time.  There is no pain with calf compression, swelling, warmth, erythema.   Neruologic: Grossly intact via light touch bilateral.   Musculoskeletal: Mild tenderness mostly on the dorsal medial aspect of the foot along the Lisfranc joint.  There is trace edema there is no erythema or warmth.  Flexor, extensor tendons appear to be intact.  There is minimal pain with abduction, adduction of the foot.  Muscular strength 5/5 in all groups tested bilateral.  Gait: Unassisted, Nonantalgic.       Assessment:   Lisfranc injury right foot     Plan:  -Treatment options discussed including all alternatives, risks, and complications -Etiology of symptoms were discussed -I verbally reviewed the.  Also new x-rays were obtained and reviewed.  Appears that the Lisfranc joint is in  adequate alignment at this time. No changes from previous.   -Although she is doing better she still in pain will continue in the boot for now   Continue ice, elevate as well as Tylenol she is not able to take anti-inflammatories.  We discussed the conservative as well as surgical treatment options for this but since she is improving so will continue conservative care.  However we discussed that if she continues to have pain or worsening displacement may need surgical intervention.  Lorenda Peck DPM

## 2022-11-06 ENCOUNTER — Encounter: Payer: Self-pay | Admitting: Medical-Surgical

## 2022-11-06 ENCOUNTER — Ambulatory Visit (INDEPENDENT_AMBULATORY_CARE_PROVIDER_SITE_OTHER): Payer: 59 | Admitting: Medical-Surgical

## 2022-11-06 ENCOUNTER — Encounter: Payer: Self-pay | Admitting: Family

## 2022-11-06 VITALS — BP 133/82 | HR 94 | Resp 20 | Ht 64.0 in | Wt 214.7 lb

## 2022-11-06 DIAGNOSIS — Z636 Dependent relative needing care at home: Secondary | ICD-10-CM

## 2022-11-06 DIAGNOSIS — F411 Generalized anxiety disorder: Secondary | ICD-10-CM

## 2022-11-06 DIAGNOSIS — E559 Vitamin D deficiency, unspecified: Secondary | ICD-10-CM

## 2022-11-06 DIAGNOSIS — F419 Anxiety disorder, unspecified: Secondary | ICD-10-CM | POA: Diagnosis not present

## 2022-11-06 DIAGNOSIS — R7303 Prediabetes: Secondary | ICD-10-CM | POA: Insufficient documentation

## 2022-11-06 DIAGNOSIS — E509 Vitamin A deficiency, unspecified: Secondary | ICD-10-CM

## 2022-11-06 DIAGNOSIS — F410 Panic disorder [episodic paroxysmal anxiety] without agoraphobia: Secondary | ICD-10-CM

## 2022-11-06 DIAGNOSIS — F32A Depression, unspecified: Secondary | ICD-10-CM | POA: Diagnosis not present

## 2022-11-06 DIAGNOSIS — Z23 Encounter for immunization: Secondary | ICD-10-CM | POA: Diagnosis not present

## 2022-11-06 DIAGNOSIS — E211 Secondary hyperparathyroidism, not elsewhere classified: Secondary | ICD-10-CM

## 2022-11-06 DIAGNOSIS — D509 Iron deficiency anemia, unspecified: Secondary | ICD-10-CM

## 2022-11-06 MED ORDER — TRAZODONE HCL 50 MG PO TABS: 25.0000 mg | ORAL_TABLET | Freq: Every evening | ORAL | 3 refills | Status: DC | PRN

## 2022-11-06 NOTE — Addendum Note (Signed)
Addended by: Beverlee Nims on: 11/06/2022 11:12 AM   Modules accepted: Orders

## 2022-11-06 NOTE — Progress Notes (Signed)
Established Patient Office Visit  Subjective   Patient ID: Meagan Floyd, female   DOB: 1966-09-03 Age: 57 y.o. MRN: [id number]   Chief Complaint  Patient presents with   Follow-up    Mood    HPI Pleasant 57 year old female presenting today for the following:  Mood: Taking Cymbalta 60 mg daily, tolerating well without side effects.  Feels that the medication is working well for her and notes her overall stress, anxiety, and depressive symptoms are much better.  Denies SI/HI.  Was diagnosed as vitamin D deficient by her bariatric surgeon.  She is currently taking vitamin D 2000 units daily as recommended.  Has an appointment to follow-up with him next month.  On recent labs, was determined to be prediabetic with a hemoglobin A1c of 6.0%.  Currently on Qsymia to help with weight loss and problematic eating behaviors.  Insomnia: Has long history of difficulty with sleep.  She has been working on sleep hygiene measures which allows her to get to sleep without difficulty.  Unfortunately, when she wakes up at night, she has trouble getting back to sleep and often lays awake for hours.  Has not tried any medications recently to help with sleep.  Her biggest concern is avoiding grogginess in the morning.   Objective:    Vitals:   11/06/22 0913  BP: 133/82  Pulse: 94  Resp: 20  Height: 5\' 4"  (1.626 m)  Weight: 214 lb 11.2 oz (97.4 kg)  SpO2: 97%  BMI (Calculated): 36.83    Physical Exam Vitals reviewed.  Constitutional:      General: She is not in acute distress.    Appearance: Normal appearance. She is not ill-appearing.  HENT:     Head: Normocephalic and atraumatic.  Cardiovascular:     Rate and Rhythm: Normal rate and regular rhythm.     Pulses: Normal pulses.     Heart sounds: Normal heart sounds.  Pulmonary:     Effort: Pulmonary effort is normal. No respiratory distress.     Breath sounds: Normal breath sounds. No wheezing, rhonchi or rales.  Skin:    General: Skin  is warm and dry.  Neurological:     Mental Status: She is alert and oriented to person, place, and time.  Psychiatric:        Mood and Affect: Mood normal.        Behavior: Behavior normal.        Thought Content: Thought content normal.        Judgment: Judgment normal.   No results found for this or any previous visit (from the past 24 hour(s)).     The 10-year ASCVD risk score (Arnett DK, et al., 2019) is: 1.7%   Values used to calculate the score:     Age: 14 years     Sex: Female     Is Non-Hispanic African American: No     Diabetic: No     Tobacco smoker: No     Systolic Blood Pressure: 409 mmHg     Is BP treated: No     HDL Cholesterol: 62 mg/dL     Total Cholesterol: 201 mg/dL   Assessment & Plan:   1. Anxiety and depression 2. Caregiver stress 3. Generalized anxiety disorder with panic attacks Symptoms well-controlled, stable.  Continue Cymbalta 60 mg daily.  4. Vitamin A deficiency Not checked recently.  Checking vitamin A today. - Vitamin A  5. Chronic iron deficiency anemia Managed by hematology/oncology.  6. Secondary hyperparathyroidism,  non-renal ([place]) Checking PTH today. - PTH, Intact and Calcium  7. Vitamin D deficiency 8. Prediabetes Currently being managed by bariatrics with [facility].  Continue oral vitamin D.  Problem list updated.  Strongly recommend dietary modification, regular intentional exercise, and weight loss to healthy weight to combat prediabetes.  Return in about 6 months (around 05/07/2023) for chronic disease follow up.  ___________________________________________ Clearnce Sorrel, DNP, APRN, FNP-BC [facility] [place]

## 2022-11-10 LAB — PTH, INTACT AND CALCIUM
Calcium: 9.5 mg/dL (ref 8.6–10.4)
PTH: 69 pg/mL (ref 16–77)

## 2022-11-10 LAB — VITAMIN A: Vitamin A (Retinoic Acid): 47 ug/dL (ref 38–98)

## 2022-11-14 ENCOUNTER — Encounter: Payer: Self-pay | Admitting: Family

## 2022-11-28 ENCOUNTER — Ambulatory Visit (INDEPENDENT_AMBULATORY_CARE_PROVIDER_SITE_OTHER): Payer: 59 | Admitting: Podiatry

## 2022-11-28 ENCOUNTER — Ambulatory Visit (INDEPENDENT_AMBULATORY_CARE_PROVIDER_SITE_OTHER): Payer: 59

## 2022-11-28 ENCOUNTER — Other Ambulatory Visit: Payer: Self-pay | Admitting: Medical-Surgical

## 2022-11-28 ENCOUNTER — Encounter: Payer: Self-pay | Admitting: Podiatry

## 2022-11-28 DIAGNOSIS — S93621D Sprain of tarsometatarsal ligament of right foot, subsequent encounter: Secondary | ICD-10-CM

## 2022-11-28 DIAGNOSIS — M79671 Pain in right foot: Secondary | ICD-10-CM

## 2022-11-28 DIAGNOSIS — M21371 Foot drop, right foot: Secondary | ICD-10-CM

## 2022-11-28 NOTE — Progress Notes (Signed)
Subjective:   Patient ID: Meagan Floyd, female   DOB: 57 y.o.   MRN: [id number]   Foot Pain   Chief Complaint  Patient presents with   Foot Pain    Right foot pain patient states foot pain is much better now                             DOI: 06/14/2022  57 year old female presents the office today for follow-up of right foot injury on 06/14/22. She relates the pain is much better. She has for the last week been walking in a regular shoe and not having any pain. Relates some soreness when walking without a shoe   Review of Systems  All other systems reviewed and are negative.  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   Microcytic anemia 08/11/2017   Obesity    Secondary hyperparathyroidism, non-renal ([place]) 03/13/2019   Vitamin A deficiency 03/13/2019    Past Surgical History:  Procedure Laterality Date   ABLATION     ESOPHAGOGASTRODUODENOSCOPY (EGD) WITH PROPOFOL N/A 05/27/2022   Procedure: ESOPHAGOGASTRODUODENOSCOPY (EGD) WITH PROPOFOL;  Surgeon: Carol Ada, MD;  Location[facility];  Service: Gastroenterology;  Laterality: N/A;   GASTRIC BYPASS     SHOULDER ARTHROSCOPY     TUMOR REMOVAL     right hand     Current Outpatient Medications:    DULoxetine (CYMBALTA) 60 MG capsule, TAKE 1 CAPSULE BY MOUTH EVERY DAY, Disp: 90 capsule, Rfl: 0   QSYMIA 7.5-46 MG CP24, Take 7.5 capsules by mouth in the morning., Disp: , Rfl:    traZODone (DESYREL) 50 MG tablet, Take 0.5-1 tablets (25-50 mg total) by mouth at bedtime as needed for sleep., Disp: 30 tablet, Rfl: 3  Allergies  Allergen Reactions   Bactrim [Sulfamethoxazole-Trimethoprim] Other (See Comments)    Severe headache   Shingrix [Zoster Vac Recomb Adjuvanted] Nausea And  Vomiting    High fever   Belsomra [Suvorexant] Other (See Comments)    paresthesias   Codeine Nausea Only   Penicillins Rash          Objective:  Physical Exam  General: AAO x3, NAD  Dermatological: Skin is warm, dry and supple bilateral.  There are no open sores, no preulcerative lesions, no rash or signs of infection present.  Vascular: Dorsalis Pedis artery and Posterior Tibial artery pedal pulses are 2/4 bilateral with immedate capillary fill time.  There is no pain with calf compression, swelling, warmth, erythema.   Neruologic: Grossly intact via light touch bilateral.   Musculoskeletal: Minimal tenderness mostly on the dorsal medial aspect of the foot along the Lisfranc joint.  There is trace edema there is no erythema or warmth.  Flexor, extensor tendons appear to be intact.  There is minimal pain with abduction, adduction of the foot.  Muscular strength 5/5 in all groups tested bilateral.  Gait: Unassisted, Nonantalgic.       Assessment:   Lisfranc injury right foot     Plan:  -Treatment options discussed including all alternatives, risks, and complications -Etiology of symptoms were discussed -I verbally reviewed the x-rays Also new x-rays were obtained and reviewed.  Appears  that the Lisfranc joint is in adequate alignment at this time. No changes from previous.   -At this time will continue with supportive shoe.   Continue ice, elevate as well as Tylenol she is not able to take anti-inflammatories.  -Patient is cleared for normal activities.  -Return to clinic in future if any problems.   Lorenda Peck DPM

## 2022-12-02 ENCOUNTER — Encounter: Payer: Self-pay | Admitting: Family

## 2022-12-07 ENCOUNTER — Encounter: Payer: Self-pay | Admitting: Medical-Surgical

## 2022-12-08 MED ORDER — SCOPOLAMINE 1 MG/3DAYS TD PT72: 1.0000 | MEDICATED_PATCH | TRANSDERMAL | 1 refills | Status: DC

## 2022-12-25 ENCOUNTER — Encounter: Payer: Self-pay | Admitting: Podiatry

## 2022-12-25 ENCOUNTER — Other Ambulatory Visit: Payer: Self-pay | Admitting: Podiatry

## 2022-12-25 DIAGNOSIS — S93621D Sprain of tarsometatarsal ligament of right foot, subsequent encounter: Secondary | ICD-10-CM

## 2022-12-25 NOTE — Telephone Encounter (Signed)
Please advise 

## 2023-01-14 ENCOUNTER — Ambulatory Visit
Admission: RE | Admit: 2023-01-14 | Discharge: 2023-01-14 | Disposition: A | Discharge status: Home / Self Care | Payer: 59 | Source: Ambulatory Visit | Attending: Podiatry | Admitting: Podiatry

## 2023-01-14 DIAGNOSIS — S93621D Sprain of tarsometatarsal ligament of right foot, subsequent encounter: Secondary | ICD-10-CM

## 2023-01-28 ENCOUNTER — Other Ambulatory Visit: Payer: Self-pay | Admitting: Medical-Surgical

## 2023-02-23 ENCOUNTER — Encounter: Payer: Self-pay | Admitting: Family

## 2023-04-28 ENCOUNTER — Other Ambulatory Visit: Payer: Self-pay | Admitting: Medical-Surgical

## 2023-04-29 NOTE — Telephone Encounter (Signed)
Appointment 08/08/204

## 2023-05-07 ENCOUNTER — Ambulatory Visit: Payer: 59 | Admitting: Medical-Surgical

## 2023-05-11 ENCOUNTER — Ambulatory Visit: Payer: 59 | Admitting: Medical-Surgical

## 2023-07-26 ENCOUNTER — Other Ambulatory Visit: Payer: Self-pay | Admitting: Medical-Surgical

## 2023-10-21 ENCOUNTER — Other Ambulatory Visit: Payer: Self-pay | Admitting: Medical-Surgical

## 2023-11-17 ENCOUNTER — Other Ambulatory Visit: Payer: Self-pay | Admitting: Medical-Surgical

## 2023-11-26 ENCOUNTER — Ambulatory Visit: Payer: 59 | Admitting: Medical-Surgical

## 2023-11-26 ENCOUNTER — Encounter: Payer: Self-pay | Admitting: Family

## 2023-11-29 ENCOUNTER — Other Ambulatory Visit: Payer: Self-pay | Admitting: Medical-Surgical

## 2023-12-04 ENCOUNTER — Ambulatory Visit: Payer: Self-pay | Admitting: Medical-Surgical

## 2023-12-11 ENCOUNTER — Ambulatory Visit (INDEPENDENT_AMBULATORY_CARE_PROVIDER_SITE_OTHER): Payer: Self-pay | Admitting: Medical-Surgical

## 2023-12-11 ENCOUNTER — Encounter: Payer: Self-pay | Admitting: Family

## 2023-12-11 VITALS — BP 122/74 | HR 73 | Resp 20 | Ht 64.0 in | Wt 162.0 lb

## 2023-12-11 DIAGNOSIS — Z636 Dependent relative needing care at home: Secondary | ICD-10-CM | POA: Diagnosis not present

## 2023-12-11 DIAGNOSIS — F331 Major depressive disorder, recurrent, moderate: Secondary | ICD-10-CM | POA: Diagnosis not present

## 2023-12-11 DIAGNOSIS — F410 Panic disorder [episodic paroxysmal anxiety] without agoraphobia: Secondary | ICD-10-CM | POA: Diagnosis not present

## 2023-12-11 DIAGNOSIS — F411 Generalized anxiety disorder: Secondary | ICD-10-CM | POA: Diagnosis not present

## 2023-12-11 MED ORDER — DULOXETINE HCL 60 MG PO CPEP: 60.0000 mg | ORAL_CAPSULE | Freq: Every day | ORAL | 3 refills | Status: AC

## 2023-12-11 MED ORDER — TRAZODONE HCL 50 MG PO TABS: 25.0000 mg | ORAL_TABLET | Freq: Every evening | ORAL | 3 refills | Status: DC | PRN

## 2023-12-11 NOTE — Progress Notes (Signed)
        Established patient visit  History, exam, impression, and plan:  1. Moderate episode of recurrent major depressive disorder (HCC) (Primary) 2. Caregiver stress 3. Generalized anxiety disorder with panic attacks Very pleasant 58 year old female presenting today for follow-up on mood management.  She has been taking Cymbalta 60 mg daily, tolerating well without side effects.  Feels that she is doing well on this medication and has no current concerns.  Continues to have caregiver stress as well as multiple other factors that affect her mental health but is doing fairly well with maintaining.  Denies SI/HI.  Mood, affect, thought pattern, cognition, and speech all normal today.  Continue Cymbalta 60 mg daily as prescribed.   Procedures performed this visit: None.  Return in about 1 year (around 12/10/2024) for mood follow up.  __________________________________ Thayer Ohm, DNP, APRN, FNP-BC Primary Care and Sports Medicine [facility] [place]

## 2023-12-29 ENCOUNTER — Encounter: Payer: Self-pay | Admitting: Medical-Surgical

## 2024-01-03 ENCOUNTER — Other Ambulatory Visit: Payer: Self-pay | Admitting: Medical-Surgical

## 2024-01-07 ENCOUNTER — Ambulatory Visit (INDEPENDENT_AMBULATORY_CARE_PROVIDER_SITE_OTHER): Admitting: Medical-Surgical

## 2024-01-07 ENCOUNTER — Encounter: Payer: Self-pay | Admitting: Medical-Surgical

## 2024-01-07 VITALS — BP 117/71 | HR 85 | Resp 20 | Ht 64.0 in | Wt 162.9 lb

## 2024-01-07 DIAGNOSIS — R7303 Prediabetes: Secondary | ICD-10-CM

## 2024-01-07 DIAGNOSIS — E66812 Obesity, class 2: Secondary | ICD-10-CM | POA: Diagnosis not present

## 2024-01-07 DIAGNOSIS — E785 Hyperlipidemia, unspecified: Secondary | ICD-10-CM | POA: Diagnosis not present

## 2024-01-07 DIAGNOSIS — Z7689 Persons encountering health services in other specified circumstances: Secondary | ICD-10-CM

## 2024-01-07 DIAGNOSIS — Z6836 Body mass index (BMI) 36.0-36.9, adult: Secondary | ICD-10-CM

## 2024-01-07 MED ORDER — WEGOVY 1.7 MG/0.75ML ~~LOC~~ SOAJ
1.7000 mg | SUBCUTANEOUS | 1 refills | Status: DC
Start: 2024-01-07 — End: 2024-07-20

## 2024-01-07 NOTE — Progress Notes (Unsigned)
        Established patient visit  History, exam, impression, and plan:  1. Encounter for weight management 2. Class 2 severe obesity due to excess calories with serious comorbidity and body mass index (BMI) of 36.0 to 36.9 in adult [facility]) (Primary) 3. Prediabetes 4. Dyslipidemia Very pleasant 58 year old female presenting today to discuss her treatment with [facility].  She has previously received this from a different clinic but would like to switch this over so that all her care is in 1 place.  She has been on Wegovy 1.7 milligrams weekly, tolerating well overall.  She had tried to go up to 2.4 mg weekly however this caused severe headaches and nausea.  She went back to 1.7 mg dose and has been doing well overall.  She feels like she has plateaued on this dose and is concerned about that.  She is now at the same weight she has been for the last 2 months.  Working on getting in cardiovascular exercises but is not doing much strength training.  Working on [facility] choices with portion control.  Does have some issues with constipation related to the injection.  Also has some nausea the day after her shot each week but this is manageable.  Would like to continue Wegovy to further aid with weight loss.  Prior to starting [facility], her weight was 213.8 pounds with a BMI of 36.69.  Today she is weighing in at 162.9 pounds and her BMI is now 27.96.  In the past she has had issues with hypertension which has now fully resolved.  She also has a diagnosis of prediabetes and dyslipidemia which are both weight related comorbidities.  Was able to get insurance coverage for [facility] through her other clinic and would like to transfer this over to [place].  Discussed recommendations for dietary intake including high-protein, low-fat, moderate carbohydrates.  Suggest counting calories as well as macros.  Would like her to incorporate strength training into her daily regimen.  Discussed isometric exercises for muscles and joints  that are affected by musculoskeletal injuries and limited range of motion.  Now continue Wegovy 1.7 mg.  Refill sent to the pharmacy.  Procedures performed this visit: None.  Return in about 6 months (around 07/08/2024) for weight check or sooner if needed.  __________________________________ Thayer Ohm, DNP, APRN, FNP-BC Primary Care and Sports Medicine [facility] [place]

## 2024-01-25 ENCOUNTER — Ambulatory Visit: Payer: Self-pay

## 2024-01-25 NOTE — Telephone Encounter (Signed)
 Copied from CRM [id number]. Topic: Clinical - Red Word Triage >> Jan 25, 2024 11:14 AM Jayson Michael wrote: [facility] that prompted transfer to Nurse Triage: pain & swelling in right knee  Patient presents with pain and swelling in her right knee. She said she is not sure why it is happening, it usually intermittent but this time it is becoming less and less in between flare ups her last flare up was 2 weeks ago. This flare up happening yesterday. Patient does not remembering injuring it, requesting acute visit sending to nurse triage for decision tree scheduling denial due to red word    Chief Complaint: On going right knee pain. Mild swelling. Symptoms: Above Frequency: on going Pertinent Negatives: Patient denies  Disposition: [] ED /[] Urgent Care (no appt availability in office) / [x] Appointment(In office/virtual)/ []  [facility]/ [] Home Care/ [] Refused Recommended Disposition /[] [facility]/ []  Follow-up with PCP Additional Notes: Agrees with appointment.  Reason for Disposition  [1] MODERATE pain (e.g., interferes with normal activities, limping) AND [2] present > 3 days  Answer Assessment - Initial Assessment Questions 1. LOCATION and RADIATION: "Where is the pain located?"      Right 2. QUALITY: "What does the pain feel like?"  (e.g., sharp, dull, aching, burning)     N/a 3. SEVERITY: "How bad is the pain?" "What does it keep you from doing?"   (Scale 1-10; or mild, moderate, severe)   -  MILD (1-3): doesn't interfere with normal activities    -  MODERATE (4-7): interferes with normal activities (e.g., work or school) or awakens from sleep, limping    -  SEVERE (8-10): excruciating pain, unable to do any normal activities, unable to walk     Now - 3 4. ONSET: "When did the pain start?" "Does it come and go, or is it there all the time?"     weeks 5. RECURRENT: "Have you had this pain before?" If Yes, ask: "When, and what happened then?"     Yes 6. SETTING: "Has  there been any recent work, exercise or other activity that involved that part of the body?"      No 7. AGGRAVATING FACTORS: "What makes the knee pain worse?" (e.g., walking, climbing stairs, running)     Walking 8. ASSOCIATED SYMPTOMS: "Is there any swelling or redness of the knee?"     Mild swelling 9. OTHER SYMPTOMS: "Do you have any other symptoms?" (e.g., chest pain, difficulty breathing, fever, calf pain)     no 10. PREGNANCY: "Is there any chance you are pregnant?" "When was your last menstrual period?"       no  Protocols used: Knee Pain-A-AH

## 2024-01-25 NOTE — Telephone Encounter (Signed)
 Pt scheduled with joy jessup, NP tomorrow

## 2024-01-26 ENCOUNTER — Ambulatory Visit

## 2024-01-26 ENCOUNTER — Ambulatory Visit (INDEPENDENT_AMBULATORY_CARE_PROVIDER_SITE_OTHER): Admitting: Medical-Surgical

## 2024-01-26 ENCOUNTER — Encounter: Payer: Self-pay | Admitting: Medical-Surgical

## 2024-01-26 VITALS — BP 113/77 | HR 76 | Resp 20 | Ht 64.0 in | Wt 164.0 lb

## 2024-01-26 DIAGNOSIS — M25561 Pain in right knee: Secondary | ICD-10-CM | POA: Diagnosis not present

## 2024-01-26 DIAGNOSIS — M545 Low back pain, unspecified: Secondary | ICD-10-CM

## 2024-01-26 DIAGNOSIS — R202 Paresthesia of skin: Secondary | ICD-10-CM

## 2024-01-26 MED ORDER — TRAMADOL HCL 50 MG PO TABS: 50.0000 mg | ORAL_TABLET | Freq: Three times a day (TID) | ORAL | 0 refills | Status: AC | PRN

## 2024-01-26 NOTE — Progress Notes (Signed)
        Established patient visit  History, exam, impression, and plan:  1. Acute pain of right knee (Primary) 2. Paresthesia of right lower extremity Pleasant 58 year old female presenting today with reports of acute pain of the right knee.  This has been going on intermittently over the last year but has never been sustained.  Over the last couple of weeks, she has noted an increase in frequency of knee pain.  She is also now experiencing episodes where her knee feels like it is buckling on her.  This has happened twice, once on Sunday and once again today.  She has pain in the top of the knee along the medial aspect of the thigh.  Has also now noted numbness and tingling in the right foot including digits 2 through 5.  She has not had any recent falls or injuries.  The pain occurs at random as does the numbness and tingling.  She used a knee sleeve which was somewhat helpful.  Pain is reported at 4/10 today.  She is taking Tylenol  but this only helps slightly and for a very short period of time.  On evaluation, she has significant pain with full flexion of the right knee as well as passive extension.  Mild discomfort with varus stress of the right knee.  Tenderness to palpation at the 1-2 o'clock position of the patella extending up into the thigh.  DP and PT pulses 2+.  Lower right extremity warm with no skin changes.  Foot is slightly colder although skin color appears normal.  Toes are cool but with brisk capillary refill.  On exam of the lumbar spine, she does have an area that is very tender to slight palpation along the midline and extending into the bilateral paraspinal muscles.  After evaluation, suspect that some of her symptoms may be related to her knee however she may have some lumbar spine etiology.  Getting x-rays of the right knee and lumbar spine today.  Discussed recommendations for pain management.  Tylenol  has been ineffective and she is contraindicated to take NSAIDs due to history of  GI bleed and partial gastrectomy.  Adding tramadol 50 mg every 8 hours as needed for pain but advised to use sparingly.  Reviewed conservative measures including ice, heat, compression, elevation, and topical analgesics.  Referring to physical therapy.  If no improvement in symptoms after 6 weeks of conservative measures, advised her to return for further evaluation.  Patient verbalized understanding and is agreeable to the plan. - DG Lumbar Spine Complete; Future - Ambulatory referral to Physical Therapy - DG Knee Complete 4 Views Right; Future  Procedures performed this visit: None.  Return for Right knee pain and right leg paresthesias in 4-6 weeks if no improvement with conservative measures.  __________________________________ Maryl Snook, DNP, APRN, FNP-BC Primary Care and Sports Medicine [facility] [place]

## 2024-01-28 ENCOUNTER — Encounter: Payer: Self-pay | Admitting: Medical-Surgical

## 2024-01-29 ENCOUNTER — Encounter: Payer: Self-pay | Admitting: Medical-Surgical

## 2024-01-29 NOTE — Telephone Encounter (Signed)
 Spoke with radiology reading room and they will move the x-ray up for reading

## 2024-02-05 ENCOUNTER — Ambulatory Visit (INDEPENDENT_AMBULATORY_CARE_PROVIDER_SITE_OTHER): Admitting: Sports Medicine

## 2024-02-05 DIAGNOSIS — M25561 Pain in right knee: Secondary | ICD-10-CM | POA: Diagnosis not present

## 2024-02-05 DIAGNOSIS — G8929 Other chronic pain: Secondary | ICD-10-CM | POA: Diagnosis not present

## 2024-02-05 MED ORDER — DICLOFENAC SODIUM 1 % EX GEL: 4.0000 g | Freq: Four times a day (QID) | CUTANEOUS | 11 refills | Status: DC

## 2024-02-05 NOTE — Progress Notes (Signed)
    Procedures performed today:    None.  Independent interpretation of notes and tests performed by another provider:   None.  Brief History, Exam, Impression, and Recommendations:    Chronic pain of right knee Very pleasant 58 year old female, she has buckling and chronic pain right knee medial joint line, retropatellar. X-rays did show an effusion, on exam she has tenderness medial joint line, she does have some patellar crepitus. Pain with terminal flexion with a positive McMurray's sign. Tylenol  and tramadol  minimally effective. I do suspect a degenerative meniscal tear, proceeding with MRI, she will do some topical Voltaren, formal physical therapy, she will continue her knee sleeve and return to see me in 6 weeks, injection if not better.    ____________________________________________ Joselyn Nicely. Sandy Crumb, M.D., ABFM., CAQSM., AME. Primary Care and Sports Medicine [facility]  Adjunct Professor of [employer] [facility] Medicine  [employer]  Restaurant manager, fast food

## 2024-02-05 NOTE — Assessment & Plan Note (Signed)
 Very pleasant 58 year old female, she has buckling and chronic pain right knee medial joint line, retropatellar. X-rays did show an effusion, on exam she has tenderness medial joint line, she does have some patellar crepitus. Pain with terminal flexion with a positive McMurray's sign. Tylenol  and tramadol  minimally effective. I do suspect a degenerative meniscal tear, proceeding with MRI, she will do some topical Voltaren, formal physical therapy, she will continue her knee sleeve and return to see me in 6 weeks, injection if not better.

## 2024-02-12 NOTE — Therapy (Signed)
 OUTPATIENT PHYSICAL THERAPY LOWER EXTREMITY EVALUATION   Patient Name: Meagan Floyd MRN: [id number] DOB:[birth year], 58 y.o., female Today's Date: 02/15/2024  END OF SESSION:  PT End of Session - 02/15/24 1535     Visit Number 1    Number of Visits 17    Date for PT Re-Evaluation 04/16/24    Authorization Type [employer]    PT Start Time 1536    PT Stop Time 1610    PT Time Calculation (min) 34 min    Activity Tolerance Patient tolerated treatment well    Behavior During Therapy [facility] for tasks assessed/performed             Past Medical History:  Diagnosis Date   Anemia    Chronic headache    Depression    Depression    Empty sella turcica (HCC) 08/14/2017   Partial on MR Brain 08/13/2017   GI bleed 04/30/2022   History of gastric bypass 04/25/2019   Hypertension    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   Microcytic anemia 08/11/2017   Obesity    Secondary hyperparathyroidism, non-renal (HCC) 03/13/2019   Vitamin A  deficiency 03/13/2019   Past Surgical History:  Procedure Laterality Date   ABLATION     ESOPHAGOGASTRODUODENOSCOPY (EGD) WITH PROPOFOL  N/A 05/27/2022   Procedure: ESOPHAGOGASTRODUODENOSCOPY (EGD) WITH PROPOFOL ;  Surgeon: Alvis Jourdain, MD;  Location: [facility];  Service: Gastroenterology;  Laterality: N/A;   GASTRIC BYPASS     SHOULDER ARTHROSCOPY     TUMOR REMOVAL     right hand   Patient Active Problem List   Diagnosis Date Noted   Chronic pain of right knee 02/05/2024   Dyslipidemia 01/07/2024   Vitamin D deficiency 11/06/2022   Prediabetes 11/06/2022   IDA (iron  deficiency anemia) 04/25/2019   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   Postmenopausal 04/13/2019   Vitamin A  deficiency 03/13/2019   Secondary hyperparathyroidism, non-renal (HCC) 03/13/2019   Caregiver stress 08/10/2018   Generalized anxiety disorder with panic attacks 07/13/2018   Empty sella turcica (HCC) 08/14/2017   Word finding difficulty 08/11/2017   Memory loss of unknown cause  08/11/2017   Moderate episode of recurrent major depressive disorder (HCC) 08/10/2017   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 She reports 2 years ago she had internal bleeding and passed out in the shower and injured the Rt foot, but didn't have a specific injury to the Rt knee. She tore a ligament, but didn't fracture the foot. She was in a boot for 10 months, but did not undergo surgery. The knee pain was intermittent to begin with, but for the past 2 months the pain became more frequent. The knee will give out, but she hasn't had a fall. Today the knee feels "ok." Occasional popping that is painful. She also reports ache in the anterior thigh that began around the same time as the knee pain. The thigh pain is present after sitting for an hour and at night when she is going to sleep. The  bottom of the Rt foot can be tingling when sitting. If she walks the tingling will go away. The thigh pain and the Rt foot paresthesia began around the same time as the knee.   PERTINENT HISTORY: Anemia  PAIN:  Are you having pain? Yes: NPRS scale: none currently; at worst 5 Pain location: Rt medial knee Pain description: ache Aggravating factors: walking Relieving factors: laying down, stretching   PRECAUTIONS: None  RED FLAGS: None   WEIGHT BEARING RESTRICTIONS: No  FALLS:  Has patient fallen in last 6 months? No  LIVING ENVIRONMENT: Lives with: lives  with their family Lives in: House/apartment Stairs: No Has following equipment at home: None  OCCUPATION: HR-desk job   PLOF: Independent  PATIENT GOALS: "learn what to do to strengthen the knee and I don't want to fall."   NEXT MD VISIT: 03/18/24  OBJECTIVE:  Note: Objective measures were completed at Evaluation unless otherwise noted.  DIAGNOSTIC FINDINGS: Rt knee X-ray: FINDINGS: No evidence of fracture or dislocation. Trace knee joint effusion. Joint spaces are preserved. No evidence of arthropathy or other focal bone abnormality. Soft tissues are unremarkable.   IMPRESSION: Trace knee joint effusion. No acute osseous abnormality.  PATIENT SURVEYS:  LEFS 47/80  COGNITION: Overall cognitive status: Within functional limits for tasks assessed     SENSATION: WFL  EDEMA:  Mild swelling Rt knee   MUSCLE LENGTH: Hamstrings: moderate tightness bilaterally   POSTURE: Rt foot ER in standing  PALPATION: Rt knee medial joint line tenderness   LOWER EXTREMITY ROM:  Active ROM Right eval Left eval  Hip flexion    Hip extension    Hip abduction    Hip adduction    Hip internal rotation    Hip external rotation    Knee flexion WNL   Knee extension WNL   Ankle dorsiflexion    Ankle plantarflexion    Ankle inversion    Ankle eversion     (Blank rows = not tested)  Lumbar AROM: WFL and pain free  LOWER EXTREMITY MMT:  MMT Right eval Left eval  Hip flexion 4 4  Hip extension 4- 4  Hip abduction 4 4+  Hip adduction    Hip internal rotation    Hip external rotation    Knee flexion 4- pain 5  Knee extension 4- pain  5  Ankle dorsiflexion 4 5  Ankle plantarflexion 5 seated 5  Ankle inversion 4 5  Ankle eversion 5 5   (Blank rows = not tested)  LOWER EXTREMITY SPECIAL TESTS:  (+) McMurray's (+) Ely's RLE  (-) SLR   FUNCTIONAL TESTS:  SLS: 20 seconds each; Rt foot ER   GAIT: Distance walked: 20 ft  Assistive device utilized: None Level of  assistance: Complete Independence Comments: Rt foot ER  [facility] Adult PT Treatment:                                                DATE: 02/15/24 Therapeutic Exercise: Demonstrated,performed, and issued initial HEP.      PATIENT EDUCATION:  Education details: see treatment  Person educated: Patient Education method: Explanation, Demonstration, Tactile cues, Verbal cues, and Handouts Education comprehension: verbalized understanding, returned demonstration, verbal cues required, tactile cues required, and needs further education  HOME EXERCISE PROGRAM: Access Code: [email or web address] URL: [email or web address] Date: 02/15/2024 Prepared by: Forrestine Ike  Exercises - Supine Active Straight Leg Raise  - 1 x daily - 7 x weekly - 2 sets - 10 reps - Sidelying Hip Abduction  - 1 x daily - 7 x weekly - 2 sets - 10 reps - Seated Long Arc Quad  - 1 x daily - 7 x weekly - 2 sets - 10 reps - Prone Hip Extension  - 1 x daily - 7 x weekly - 2 sets - 10 reps - Supine Quadriceps Stretch with Strap on Table  - 1 x daily - 7 x weekly - 3 sets - 30 sec  hold  ASSESSMENT:  CLINICAL IMPRESSION: Patient is a 58 y.o. female who was seen today for physical therapy evaluation and treatment for chronic Rt knee pain. Patient reports onset of knee pain, anterior thigh pain, and Rt foot paresthesia that began about a year ago. She does not recall a MOI to the knee, but did experience a Rt foot injury about 2 years ago that resulted in prolonged immobilization of the foot. Upon assessment she is noted to have Rt knee weakness, Rt ankle weakness, bilateral hip weakness, gait abnormalities, quad/hamstring tightness, and positive McMurray's special test and medial joint line tenderness. She will benefit from skilled PT to address the above stated deficits in order to optimize her function  and assist in overall pain reduction.    OBJECTIVE IMPAIRMENTS: Abnormal gait, decreased activity tolerance, decreased balance, decreased endurance, decreased knowledge of condition, difficulty walking, decreased strength, increased edema, increased fascial restrictions, impaired flexibility, impaired sensation, improper body mechanics, postural dysfunction, and pain.   ACTIVITY LIMITATIONS: carrying, lifting, bending, sitting, standing, squatting, stairs, transfers, and locomotion level  PARTICIPATION LIMITATIONS: cleaning, laundry, shopping, community activity, occupation, and yard work  PERSONAL FACTORS: Age, Fitness, Profession, Time since onset of injury/illness/exacerbation, and 3+ comorbidities: see PMH above are also affecting patient's functional outcome.   REHAB POTENTIAL: Good  CLINICAL DECISION MAKING: Evolving/moderate complexity  EVALUATION COMPLEXITY: Moderate   GOALS: Goals reviewed with patient? Yes  SHORT TERM GOALS: Target date: 03/14/2024   Patient will be independent and compliant with initial HEP.   Baseline: issued at eval  Goal status: INITIAL  2.  Patient will maintain SLS on the RLE with proper alignment for 30 seconds to improve stability.  Baseline: Rt foot ER 20 seconds Goal status: INITIAL  3.  Patient will report pain at worst rated as </= 3/10 indicative of improvement in current condition.  Baseline: 5 Goal status: INITIAL   LONG TERM GOALS: Target date: 04/16/24  Patient will score >/= 60/80 on the LEFS (MCID is 9) to signify clinically meaningful improvement in functional abilities.   Baseline: see above Goal status: INITIAL  2.  Patient will demonstrate 5/5 Rt knee strength to improve gait stability.  Baseline: see above  Goal  status: INITIAL  3.  Patient will demonstrate 5/5 Rt ankle strength to improve stability when walking on uneven surfaces.  Baseline: see above Goal status: INITIAL  4.  Patient will demonstrate 5/5 bilateral  hip strength to improve stability about the chain with prolonged walking/standing activity.  Baseline: see above  Goal status: INITIAL    PLAN:  PT FREQUENCY: 1-2x/week  PT DURATION: 8 weeks  PLANNED INTERVENTIONS: 97164- PT Re-evaluation, 97750- Physical Performance Testing, 97110-Therapeutic exercises, 97530- Therapeutic activity, [id number]- Neuromuscular re-education, 97535- Self Care, 16109- Manual therapy, [id number]- Gait training, [phone]- Aquatic Therapy, [phone]- Electrical stimulation (unattended), [id number]- Electrical stimulation (manual), 97016- Vasopneumatic device, [id number]- Ionotophoresis 4mg /ml Dexamethasone , Taping, Dry Needling, Cryotherapy, and Moist heat  PLAN FOR NEXT SESSION: hip,knee, ankle strengthening; gait training working to correct foot ER; consider TPDN to quad    Forrestine Ike, PT, DPT, ATC 02/15/24 4:29 PM

## 2024-02-15 ENCOUNTER — Ambulatory Visit: Attending: Sports Medicine

## 2024-02-15 ENCOUNTER — Other Ambulatory Visit: Payer: Self-pay

## 2024-02-15 DIAGNOSIS — M25561 Pain in right knee: Secondary | ICD-10-CM | POA: Insufficient documentation

## 2024-02-15 DIAGNOSIS — G8929 Other chronic pain: Secondary | ICD-10-CM | POA: Diagnosis present

## 2024-02-15 DIAGNOSIS — R2689 Other abnormalities of gait and mobility: Secondary | ICD-10-CM | POA: Diagnosis present

## 2024-02-15 DIAGNOSIS — R29898 Other symptoms and signs involving the musculoskeletal system: Secondary | ICD-10-CM | POA: Diagnosis present

## 2024-02-15 DIAGNOSIS — R600 Localized edema: Secondary | ICD-10-CM | POA: Diagnosis present

## 2024-02-15 DIAGNOSIS — R202 Paresthesia of skin: Secondary | ICD-10-CM | POA: Insufficient documentation

## 2024-02-16 NOTE — Addendum Note (Signed)
 Addended by: Gean Keels on: 02/16/2024 12:54 PM   Modules accepted: Orders

## 2024-02-25 ENCOUNTER — Ambulatory Visit

## 2024-02-25 NOTE — Therapy (Incomplete)
 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   Microcytic anemia 08/11/2017   Obesity    Secondary hyperparathyroidism, non-renal (HCC) 03/13/2019   Vitamin A  deficiency 03/13/2019   Past Surgical History:  Procedure Laterality Date   ABLATION     ESOPHAGOGASTRODUODENOSCOPY (EGD) WITH PROPOFOL  N/A 05/27/2022   Procedure: ESOPHAGOGASTRODUODENOSCOPY (EGD) WITH PROPOFOL ;  Surgeon: Alvis Jourdain, MD;  Location: [facility];  Service: Gastroenterology;  Laterality: N/A;   GASTRIC BYPASS     SHOULDER ARTHROSCOPY     TUMOR REMOVAL     right hand   Patient Active Problem List   Diagnosis Date Noted   Chronic pain of right knee 02/05/2024   Dyslipidemia 01/07/2024   Vitamin D deficiency 11/06/2022   Prediabetes 11/06/2022   IDA (iron  deficiency anemia) 04/25/2019   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   Postmenopausal 04/13/2019   Vitamin A  deficiency 03/13/2019   Secondary hyperparathyroidism, non-renal (HCC) 03/13/2019   Caregiver stress 08/10/2018   Generalized anxiety disorder with panic attacks 07/13/2018   Empty sella turcica (HCC) 08/14/2017   Word finding difficulty 08/11/2017   Memory loss of unknown cause 08/11/2017   Moderate episode of recurrent major depressive disorder (HCC) 08/10/2017   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  Rationale for  Evaluation and Treatment: Rehabilitation  ONSET DATE: chronic   SUBJECTIVE:   SUBJECTIVE STATEMENT: Patient reports Rt knee pain has been going on for well over a year. She reports 2 years ago she had internal bleeding and passed out in the shower and injured the Rt foot, but didn't have a specific injury to the Rt knee. She tore a ligament, but didn't fracture the foot. She was in a boot for 10 months, but did not undergo surgery. The knee pain was intermittent to begin with, but for the past 2 months the pain became more frequent. The knee will give out, but she hasn't had a fall. Today the knee feels "ok." Occasional popping that is painful. She also reports ache in the anterior thigh that began around the same time as the knee pain. The thigh pain is present after sitting for an hour and at night when she is going to sleep. The bottom of the Rt foot can be tingling when sitting. If she walks the tingling will go away. The thigh pain and the Rt foot paresthesia began around the same time as the knee.   PERTINENT HISTORY: Anemia  PAIN:  Are you having pain? Yes: NPRS scale: none currently; at worst 5 Pain location: Rt medial knee Pain description: ache Aggravating factors: walking Relieving factors: laying down, stretching   PRECAUTIONS: None  RED FLAGS: None   WEIGHT BEARING RESTRICTIONS: No  FALLS:  Has patient fallen in last 6 months? No  LIVING ENVIRONMENT: Lives with: lives with their family Lives in: House/apartment Stairs: No Has following equipment  at home: None  OCCUPATION: HR-desk job   PLOF: Independent  PATIENT GOALS: "learn what to do to strengthen the knee and I don't want to fall."   NEXT MD VISIT: 03/18/24  OBJECTIVE:  Note: Objective measures were completed at Evaluation unless otherwise noted.  DIAGNOSTIC FINDINGS: Rt knee X-ray: FINDINGS: No evidence of fracture or dislocation. Trace knee joint effusion. Joint spaces are preserved. No evidence of  arthropathy or other focal bone abnormality. Soft tissues are unremarkable.   IMPRESSION: Trace knee joint effusion. No acute osseous abnormality.  PATIENT SURVEYS:  LEFS 47/80  COGNITION: Overall cognitive status: Within functional limits for tasks assessed     SENSATION: WFL  EDEMA:  Mild swelling Rt knee   MUSCLE LENGTH: Hamstrings: moderate tightness bilaterally   POSTURE: Rt foot ER in standing  PALPATION: Rt knee medial joint line tenderness   LOWER EXTREMITY ROM:  Active ROM Right eval Left eval  Hip flexion    Hip extension    Hip abduction    Hip adduction    Hip internal rotation    Hip external rotation    Knee flexion WNL   Knee extension WNL   Ankle dorsiflexion    Ankle plantarflexion    Ankle inversion    Ankle eversion     (Blank rows = not tested)  Lumbar AROM: WFL and pain free  LOWER EXTREMITY MMT:  MMT Right eval Left eval  Hip flexion 4 4  Hip extension 4- 4  Hip abduction 4 4+  Hip adduction    Hip internal rotation    Hip external rotation    Knee flexion 4- pain 5  Knee extension 4- pain  5  Ankle dorsiflexion 4 5  Ankle plantarflexion 5 seated 5  Ankle inversion 4 5  Ankle eversion 5 5   (Blank rows = not tested)  LOWER EXTREMITY SPECIAL TESTS:  (+) McMurray's (+) Ely's RLE  (-) SLR   FUNCTIONAL TESTS:  SLS: 20 seconds each; Rt foot ER   GAIT: Distance walked: 20 ft  Assistive device utilized: None Level of assistance: Complete Independence Comments: Rt foot ER                                                                                                                                 [facility] Adult PT Treatment:                                                DATE: 02/15/24 Therapeutic Exercise: Demonstrated,performed, and issued initial HEP.      PATIENT EDUCATION:  Education details: see treatment  Person educated: Patient Education method: Explanation, Demonstration, Tactile cues, Verbal cues, and  Handouts Education comprehension: verbalized understanding, returned demonstration, verbal cues required, tactile cues required, and needs further education  HOME EXERCISE PROGRAM:  Access Code: [email or web address] URL: [email or web address] Date: 02/15/2024 Prepared by: Forrestine Ike  Exercises - Supine Active Straight Leg Raise  - 1 x daily - 7 x weekly - 2 sets - 10 reps - Sidelying Hip Abduction  - 1 x daily - 7 x weekly - 2 sets - 10 reps - Seated Long Arc Quad  - 1 x daily - 7 x weekly - 2 sets - 10 reps - Prone Hip Extension  - 1 x daily - 7 x weekly - 2 sets - 10 reps - Supine Quadriceps Stretch with Strap on Table  - 1 x daily - 7 x weekly - 3 sets - 30 sec  hold  ASSESSMENT:  CLINICAL IMPRESSION: Patient is a 58 y.o. female who was seen today for physical therapy evaluation and treatment for chronic Rt knee pain. Patient reports onset of knee pain, anterior thigh pain, and Rt foot paresthesia that began about a year ago. She does not recall a MOI to the knee, but did experience a Rt foot injury about 2 years ago that resulted in prolonged immobilization of the foot. Upon assessment she is noted to have Rt knee weakness, Rt ankle weakness, bilateral hip weakness, gait abnormalities, quad/hamstring tightness, and positive McMurray's special test and medial joint line tenderness. She will benefit from skilled PT to address the above stated deficits in order to optimize her function and assist in overall pain reduction.    OBJECTIVE IMPAIRMENTS: Abnormal gait, decreased activity tolerance, decreased balance, decreased endurance, decreased knowledge of condition, difficulty walking, decreased strength, increased edema, increased fascial restrictions, impaired flexibility, impaired sensation, improper body mechanics, postural dysfunction, and pain.   ACTIVITY LIMITATIONS: carrying, lifting, bending, sitting, standing, squatting, stairs, transfers, and locomotion level  PARTICIPATION  LIMITATIONS: cleaning, laundry, shopping, community activity, occupation, and yard work  PERSONAL FACTORS: Age, Fitness, Profession, Time since onset of injury/illness/exacerbation, and 3+ comorbidities: see PMH above are also affecting patient's functional outcome.   REHAB POTENTIAL: Good  CLINICAL DECISION MAKING: Evolving/moderate complexity  EVALUATION COMPLEXITY: Moderate   GOALS: Goals reviewed with patient? Yes  SHORT TERM GOALS: Target date: 03/14/2024   Patient will be independent and compliant with initial HEP.   Baseline: issued at eval  Goal status: INITIAL  2.  Patient will maintain SLS on the RLE with proper alignment for 30 seconds to improve stability.  Baseline: Rt foot ER 20 seconds Goal status: INITIAL  3.  Patient will report pain at worst rated as </= 3/10 indicative of improvement in current condition.  Baseline: 5 Goal status: INITIAL   LONG TERM GOALS: Target date: 04/16/24  Patient will score >/= 60/80 on the LEFS (MCID is 9) to signify clinically meaningful improvement in functional abilities.   Baseline: see above Goal status: INITIAL  2.  Patient will demonstrate 5/5 Rt knee strength to improve gait stability.  Baseline: see above  Goal status: INITIAL  3.  Patient will demonstrate 5/5 Rt ankle strength to improve stability when walking on uneven surfaces.  Baseline: see above Goal status: INITIAL  4.  Patient will demonstrate 5/5 bilateral hip strength to improve stability about the chain with prolonged walking/standing activity.  Baseline: see above  Goal status: INITIAL    PLAN:  PT FREQUENCY: 1-2x/week  PT DURATION: 8 weeks  PLANNED INTERVENTIONS: 97164- PT Re-evaluation, 97750- Physical Performance Testing, 97110-Therapeutic exercises, 97530- Therapeutic activity, [id number]- Neuromuscular re-education, 97535- Self Care, 40981- Manual therapy, [id number]- Gait training, [phone]- Aquatic Therapy, [phone]- Electrical stimulation (unattended),  [phone]- Electrical stimulation (  manual), [id number]- Vasopneumatic device, [id number]- Ionotophoresis 4mg /ml Dexamethasone , Taping, Dry Needling, Cryotherapy, and Moist heat  PLAN FOR NEXT SESSION: hip,knee, ankle strengthening; gait training working to correct foot ER; consider TPDN to quad    Forrestine Ike, PT, DPT, ATC 02/25/24 7:52 AM

## 2024-02-28 ENCOUNTER — Ambulatory Visit

## 2024-02-28 DIAGNOSIS — M25561 Pain in right knee: Secondary | ICD-10-CM

## 2024-02-28 DIAGNOSIS — G8929 Other chronic pain: Secondary | ICD-10-CM

## 2024-03-04 ENCOUNTER — Encounter: Payer: Self-pay | Admitting: Sports Medicine

## 2024-03-04 ENCOUNTER — Ambulatory Visit

## 2024-03-11 ENCOUNTER — Ambulatory Visit: Payer: Self-pay | Admitting: Sports Medicine

## 2024-03-11 ENCOUNTER — Ambulatory Visit: Attending: Sports Medicine

## 2024-03-11 DIAGNOSIS — R2689 Other abnormalities of gait and mobility: Secondary | ICD-10-CM | POA: Insufficient documentation

## 2024-03-11 DIAGNOSIS — M25561 Pain in right knee: Secondary | ICD-10-CM | POA: Insufficient documentation

## 2024-03-11 DIAGNOSIS — R600 Localized edema: Secondary | ICD-10-CM | POA: Insufficient documentation

## 2024-03-11 DIAGNOSIS — G8929 Other chronic pain: Secondary | ICD-10-CM | POA: Insufficient documentation

## 2024-03-11 DIAGNOSIS — R29898 Other symptoms and signs involving the musculoskeletal system: Secondary | ICD-10-CM | POA: Insufficient documentation

## 2024-03-11 DIAGNOSIS — R202 Paresthesia of skin: Secondary | ICD-10-CM | POA: Diagnosis present

## 2024-03-11 NOTE — Therapy (Signed)
 OUTPATIENT PHYSICAL THERAPY LOWER EXTREMITY TREATMENT   Patient Name: Meagan Floyd MRN: [id number] DOB:03-15-1966, 58 y.o., female Today's Date: 03/11/2024  END OF SESSION:  PT End of Session - 03/11/24 0759     Visit Number 2    Number of Visits 17    Date for PT Re-Evaluation 04/16/24    Authorization Type [employer]    PT Start Time 0800    PT Stop Time 0841    PT Time Calculation (min) 41 min    Activity Tolerance Patient tolerated treatment well    Behavior During Therapy [facility] for tasks assessed/performed           Past Medical History:  Diagnosis Date   Anemia    Chronic headache    Depression    Depression    Empty sella turcica (HCC) 08/14/2017   Partial on MR Brain 08/13/2017   GI bleed 04/30/2022   History of gastric bypass 04/25/2019   Hypertension    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   Microcytic anemia 08/11/2017   Obesity    Secondary hyperparathyroidism, non-renal (HCC) 03/13/2019   Vitamin A  deficiency 03/13/2019   Past Surgical History:  Procedure Laterality Date   ABLATION     ESOPHAGOGASTRODUODENOSCOPY (EGD) WITH PROPOFOL  N/A 05/27/2022   Procedure: ESOPHAGOGASTRODUODENOSCOPY (EGD) WITH PROPOFOL ;  Surgeon: Alvis Jourdain, MD;  Location: [facility];  Service: Gastroenterology;  Laterality: N/A;   GASTRIC BYPASS     SHOULDER ARTHROSCOPY     TUMOR REMOVAL     right hand   Patient Active Problem List   Diagnosis Date Noted   Chronic pain of right knee 02/05/2024   Dyslipidemia 01/07/2024   Vitamin D deficiency 11/06/2022   Prediabetes 11/06/2022   IDA (iron  deficiency anemia) 04/25/2019   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   Postmenopausal 04/13/2019   Vitamin A  deficiency 03/13/2019   Secondary hyperparathyroidism, non-renal (HCC) 03/13/2019   Caregiver stress 08/10/2018   Generalized anxiety disorder with panic attacks 07/13/2018   Empty sella turcica (HCC) 08/14/2017   Word finding difficulty 08/11/2017   Memory loss of unknown cause  08/11/2017   Moderate episode of recurrent major depressive disorder (HCC) 08/10/2017   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 Has been doing her exercises. No pain currently.   EVAL:Patient reports Rt knee pain has been going on for well over a year. She reports 2 years ago she had internal bleeding and passed out in the shower and injured the Rt foot, but didn't have a specific injury to the Rt knee. She tore a ligament, but didn't fracture the foot. She was in a boot for 10 months, but did not undergo surgery. The knee pain was intermittent to begin with, but for the past 2 months the pain became more frequent. The knee will give out, but she hasn't had a fall. Today the knee feels ok. Occasional popping that is painful. She also reports ache in the anterior thigh that began around the same time as the knee pain. The thigh pain is present  after sitting for an hour and at night when she is going to sleep. The bottom of the Rt foot can be tingling when sitting. If she walks the tingling will go away. The thigh pain and the Rt foot paresthesia began around the same time as the knee.   PERTINENT HISTORY: Anemia  PAIN:  Are you having pain? Yes: NPRS scale: none currently; at worst 3 Pain location: Rt medial knee Pain description: ache Aggravating factors: overactivity Relieving factors: laying down, stretching   PRECAUTIONS: None  RED FLAGS: None   WEIGHT BEARING  RESTRICTIONS: No  FALLS:  Has patient fallen in last 6 months? No  LIVING ENVIRONMENT: Lives with: lives with their family Lives in: House/apartment Stairs: No Has following equipment at home: None  OCCUPATION: HR-desk job   PLOF: Independent  PATIENT GOALS: learn what to do to strengthen the knee and I don't want to fall.   NEXT MD VISIT: 03/18/24  OBJECTIVE:  Note: Objective measures were completed at Evaluation unless otherwise noted.  DIAGNOSTIC FINDINGS: Rt knee X-ray: FINDINGS: No evidence of fracture or dislocation. Trace knee joint effusion. Joint spaces are preserved. No evidence of arthropathy or other focal bone abnormality. Soft tissues are unremarkable.   IMPRESSION: Trace knee joint effusion. No acute osseous abnormality.  PATIENT SURVEYS:  LEFS 47/80  COGNITION: Overall cognitive status: Within functional limits for tasks assessed     SENSATION: WFL  EDEMA:  Mild swelling Rt knee   MUSCLE LENGTH: Hamstrings: moderate tightness bilaterally   POSTURE: Rt foot ER in standing  PALPATION: Rt knee medial joint line tenderness   LOWER EXTREMITY ROM:  Active ROM Right eval Left eval  Hip flexion    Hip extension    Hip abduction    Hip adduction    Hip internal rotation    Hip external rotation    Knee flexion WNL   Knee extension WNL   Ankle dorsiflexion    Ankle plantarflexion    Ankle inversion    Ankle eversion     (Blank rows = not tested)  Lumbar AROM: WFL and pain free  LOWER EXTREMITY MMT:  MMT Right eval Left eval  Hip flexion 4 4  Hip extension 4- 4  Hip abduction 4 4+  Hip adduction    Hip internal rotation    Hip external rotation    Knee flexion 4- pain 5  Knee extension 4- pain  5  Ankle dorsiflexion 4 5  Ankle plantarflexion 5 seated 5  Ankle inversion 4 5  Ankle eversion 5 5   (Blank rows = not tested)  LOWER EXTREMITY SPECIAL TESTS:  (+) McMurray's (+) Ely's RLE  (-) SLR   Leg length: 91 cm  bilateral ASIS to medial malleolus   FUNCTIONAL TESTS:  SLS: 20 seconds each; Rt foot ER   GAIT: Distance walked: 20 ft  Assistive device utilized: None Level of assistance: Complete Independence Comments: Rt foot ER    [facility] Adult PT Treatment:                                                DATE: 03/11/24 Therapeutic Exercise: Calf stretch x 30 sec each gastroc SL calf raise 2 x 10  Lateral band walks d/c due to knee pain Resisted ankle inversion 2 x 10 green band Resisted ankle DF 2 x 10 green band  Neuromuscular re-ed: SLR x 10 each  Sidelying hip abduction x 10  Prone hip extension x 10  LAQ x 10; 2 lbs  SLS 2 x 30 sec Hip bridge with abduction blue band 2 x 10                                                                                                                               [facility] Adult PT Treatment:                                                DATE: 02/15/24 Therapeutic Exercise: Demonstrated,performed, and issued initial HEP.      PATIENT EDUCATION:  Education details: HEP update  Person educated: Patient Education method: Explanation, Demonstration, Tactile cues, Verbal cues, and Handouts Education comprehension: verbalized understanding, returned demonstration, verbal cues required, tactile cues required, and needs further education  HOME EXERCISE PROGRAM: Access Code: [email or web address] URL: [email or web address] Date: 03/11/2024 Prepared by: Forrestine Ike  Exercises - Supine Active Straight Leg Raise  - 1 x daily - 7 x weekly - 2 sets - 10 reps - Sidelying Hip Abduction  - 1 x daily - 7 x weekly - 2 sets - 10 reps - Seated Long Arc Quad  - 1 x daily - 7 x weekly - 2 sets - 10 reps - Prone Hip Extension  - 1 x daily - 7 x weekly - 2 sets - 10 reps - Supine Quadriceps Stretch with Strap on Table  - 1 x daily - 7 x weekly - 3 sets - 30 sec  hold - Seated Ankle Inversion with Resistance and Legs Crossed  - 1 x daily - 7 x weekly - 2 sets - 10  reps - Ankle Dorsiflexion with Resistance  - 1 x daily - 7 x weekly - 2 sets - 10 reps - Gastroc Stretch on Wall  - 1 x daily - 7 x weekly - 3 sets - 30 sec  hold - Bridge with Hip Abduction and Resistance  - 1 x daily - 7 x weekly - 2 sets - 10 reps  ASSESSMENT:  CLINICAL IMPRESSION: Patient arrives for first PT treatment session reporting overall improvement in pain and paresthesia. Reviewed HEP with patient demonstrating independence with these prescribed exercises, STG #1 met. Introduced ankle strengthening today and standing strengthening/balance activity focusing on maintaining neutral Rt foot positioning as she has tendency to maintain ER. She is able to maintain SLS with neutral foot positioning on the RLE for 30 seconds, STG #2 met.  She tolerated strength progression well with exception of lateral band walks as this caused Rt knee pain and was discontinued.   EVAL:Patient is a 58 y.o. female who was seen today for physical therapy evaluation and treatment for chronic Rt knee pain. Patient reports onset of knee pain,  anterior thigh pain, and Rt foot paresthesia that began about a year ago. She does not recall a MOI to the knee, but did experience a Rt foot injury about 2 years ago that resulted in prolonged immobilization of the foot. Upon assessment she is noted to have Rt knee weakness, Rt ankle weakness, bilateral hip weakness, gait abnormalities, quad/hamstring tightness, and positive McMurray's special test and medial joint line tenderness. She will benefit from skilled PT to address the above stated deficits in order to optimize her function and assist in overall pain reduction.    OBJECTIVE IMPAIRMENTS: Abnormal gait, decreased activity tolerance, decreased balance, decreased endurance, decreased knowledge of condition, difficulty walking, decreased strength, increased edema, increased fascial restrictions, impaired flexibility, impaired sensation, improper body mechanics, postural  dysfunction, and pain.   ACTIVITY LIMITATIONS: carrying, lifting, bending, sitting, standing, squatting, stairs, transfers, and locomotion level  PARTICIPATION LIMITATIONS: cleaning, laundry, shopping, community activity, occupation, and yard work  PERSONAL FACTORS: Age, Fitness, Profession, Time since onset of injury/illness/exacerbation, and 3+ comorbidities: see PMH above are also affecting patient's functional outcome.   REHAB POTENTIAL: Good  CLINICAL DECISION MAKING: Evolving/moderate complexity  EVALUATION COMPLEXITY: Moderate   GOALS: Goals reviewed with patient? Yes  SHORT TERM GOALS: Target date: 03/14/2024   Patient will be independent and compliant with initial HEP.   Baseline: issued at eval  Goal status: MET  2.  Patient will maintain SLS on the RLE with proper alignment for 30 seconds to improve stability.  Baseline: Rt foot ER 20 seconds 03/11/24: 30 seconds proper alignment  Goal status: MET  3.  Patient will report pain at worst rated as </= 3/10 indicative of improvement in current condition.  Baseline: 5 03/11/24: 3 Goal status: MET   LONG TERM GOALS: Target date: 04/16/24  Patient will score >/= 60/80 on the LEFS (MCID is 9) to signify clinically meaningful improvement in functional abilities.   Baseline: see above Goal status: INITIAL  2.  Patient will demonstrate 5/5 Rt knee strength to improve gait stability.  Baseline: see above  Goal status: INITIAL  3.  Patient will demonstrate 5/5 Rt ankle strength to improve stability when walking on uneven surfaces.  Baseline: see above Goal status: INITIAL  4.  Patient will demonstrate 5/5 bilateral hip strength to improve stability about the chain with prolonged walking/standing activity.  Baseline: see above  Goal status: INITIAL    PLAN:  PT FREQUENCY: 1-2x/week  PT DURATION: 8 weeks  PLANNED INTERVENTIONS: 97164- PT Re-evaluation, 97750- Physical Performance Testing, 97110-Therapeutic  exercises, 97530- Therapeutic activity, [id number]- Neuromuscular re-education, 97535- Self Care, [id number]- Manual therapy, [id number]- Gait training, [phone]- Aquatic Therapy, [phone]- Electrical stimulation (unattended), [id number]- Electrical stimulation (manual), 97016- Vasopneumatic device, [id number]- Ionotophoresis 4mg /ml Dexamethasone , Taping, Dry Needling, Cryotherapy, and Moist heat  PLAN FOR NEXT SESSION: hip,knee, ankle strengthening; gait training working to correct foot ER;    Forrestine Ike, PT, DPT, ATC 03/11/24 8:43 AM

## 2024-03-18 ENCOUNTER — Ambulatory Visit

## 2024-03-18 ENCOUNTER — Ambulatory Visit: Admitting: Sports Medicine

## 2024-03-18 DIAGNOSIS — R202 Paresthesia of skin: Secondary | ICD-10-CM

## 2024-03-18 DIAGNOSIS — R600 Localized edema: Secondary | ICD-10-CM

## 2024-03-18 DIAGNOSIS — G8929 Other chronic pain: Secondary | ICD-10-CM

## 2024-03-18 DIAGNOSIS — R2689 Other abnormalities of gait and mobility: Secondary | ICD-10-CM

## 2024-03-18 DIAGNOSIS — R29898 Other symptoms and signs involving the musculoskeletal system: Secondary | ICD-10-CM

## 2024-03-18 DIAGNOSIS — M25561 Pain in right knee: Secondary | ICD-10-CM | POA: Diagnosis not present

## 2024-03-18 NOTE — Therapy (Addendum)
 OUTPATIENT PHYSICAL THERAPY LOWER EXTREMITY TREATMENT PHYSICAL THERAPY DISCHARGE SUMMARY  Visits from Start of Care: 3  Current functional level related to goals / functional outcomes: See goals below   Remaining deficits: Status unknown   Education / Equipment: N/A    Patient agrees to discharge. Patient goals were partially met. Patient is being discharged due to not returning since the last visit.   Patient Name: Meagan Floyd MRN: [id number] DOB:1966-02-05, 58 y.o., female Today's Date: 03/18/2024  END OF SESSION:  PT End of Session - 03/18/24 0801     Visit Number 3    Number of Visits 17    Date for PT Re-Evaluation 04/16/24    Authorization Type [employer]    PT Start Time 0802    PT Stop Time 0843    PT Time Calculation (min) 41 min    Activity Tolerance Patient tolerated treatment well    Behavior During Therapy [facility] for tasks assessed/performed         Past Medical History:  Diagnosis Date   Anemia    Chronic headache    Depression    Depression    Empty sella turcica (HCC) 08/14/2017   Partial on MR Brain 08/13/2017   GI bleed 04/30/2022   History of gastric bypass 04/25/2019   Hypertension    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   Microcytic anemia 08/11/2017   Obesity    Secondary hyperparathyroidism, non-renal (HCC) 03/13/2019   Vitamin A  deficiency 03/13/2019   Past Surgical History:  Procedure Laterality Date   ABLATION     ESOPHAGOGASTRODUODENOSCOPY (EGD) WITH PROPOFOL  N/A 05/27/2022   Procedure: ESOPHAGOGASTRODUODENOSCOPY (EGD) WITH PROPOFOL ;  Surgeon: Rollin Dover, MD;  Location: [facility];  Service: Gastroenterology;  Laterality: N/A;   GASTRIC BYPASS     SHOULDER ARTHROSCOPY     TUMOR REMOVAL     right hand   Patient Active Problem List   Diagnosis Date Noted   Chronic pain of right knee 02/05/2024   Dyslipidemia 01/07/2024   Vitamin D deficiency 11/06/2022   Prediabetes 11/06/2022   IDA (iron  deficiency anemia) 04/25/2019    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   Postmenopausal 04/13/2019   Vitamin A  deficiency 03/13/2019   Secondary hyperparathyroidism, non-renal (HCC) 03/13/2019   Caregiver stress 08/10/2018   Generalized anxiety disorder with panic attacks 07/13/2018   Empty sella turcica (HCC) 08/14/2017   Word finding difficulty 08/11/2017   Memory loss of unknown cause 08/11/2017   Moderate episode of recurrent major depressive disorder (HCC) 08/10/2017   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 Patient states her knee is not giving out but yesterday noticed popping again in knee but no pain present.   EVAL:Patient reports Rt knee pain has been going on for well over a year. She reports 2 years ago she had internal bleeding and passed out in the shower and injured the Rt foot, but didn't have a specific injury to the Rt  knee. She tore a ligament, but didn't fracture the foot. She was in a boot for 10 months, but did not undergo surgery. The knee pain was intermittent to begin with, but for the past 2 months the pain became more frequent. The knee will give out, but she hasn't had a fall. Today the knee feels ok. Occasional popping that is painful. She also reports ache in the anterior thigh that began around the same time as the knee pain. The thigh pain is present after sitting for an hour and at  night when she is going to sleep. The bottom of the Rt foot can be tingling when sitting. If she walks the tingling will go away. The thigh pain and the Rt foot paresthesia began around the same time as the knee.   PERTINENT HISTORY: Anemia  PAIN:  Are you having pain? Yes: NPRS scale: none currently; at worst 3 Pain location: Rt medial knee Pain description: ache Aggravating factors: overactivity Relieving factors: laying down, stretching   PRECAUTIONS: None  RED FLAGS: None   WEIGHT BEARING RESTRICTIONS: No  FALLS:  Has patient fallen in last 6 months? No  LIVING ENVIRONMENT: Lives with: lives with their family Lives in: House/apartment Stairs: No Has following equipment at home: None  OCCUPATION: HR-desk job   PLOF: Independent  PATIENT GOALS: learn what to do to strengthen the knee and I don't want to fall.   NEXT MD VISIT: 03/18/24  OBJECTIVE:  Note: Objective measures were completed at Evaluation unless otherwise noted.  DIAGNOSTIC FINDINGS: Rt knee X-ray: FINDINGS: No evidence of fracture or dislocation. Trace knee joint effusion. Joint spaces are preserved. No evidence of arthropathy or other focal bone abnormality. Soft tissues are unremarkable.   IMPRESSION: Trace knee joint effusion. No acute osseous abnormality.  PATIENT SURVEYS:  LEFS 47/80  COGNITION: Overall cognitive status: Within functional limits for tasks assessed     SENSATION: WFL  EDEMA:  Mild swelling Rt knee   MUSCLE LENGTH: Hamstrings: moderate tightness bilaterally   POSTURE: Rt foot ER in standing  PALPATION: Rt knee medial joint line tenderness   LOWER EXTREMITY ROM:  Active ROM Right eval Left eval  Hip flexion    Hip extension    Hip abduction    Hip adduction    Hip internal rotation    Hip external rotation    Knee flexion WNL   Knee extension WNL   Ankle dorsiflexion    Ankle plantarflexion    Ankle inversion    Ankle eversion     (Blank rows = not  tested)  Lumbar AROM: WFL and pain free  LOWER EXTREMITY MMT:  MMT Right eval Left eval  Hip flexion 4 4  Hip extension 4- 4  Hip abduction 4 4+  Hip adduction    Hip internal rotation    Hip external rotation    Knee flexion 4- pain 5  Knee extension 4- pain  5  Ankle dorsiflexion 4 5  Ankle plantarflexion 5 seated 5  Ankle inversion 4 5  Ankle eversion 5 5   (Blank rows = not tested)  LOWER EXTREMITY SPECIAL TESTS:  (+) McMurray's (+) Ely's RLE  (-) SLR   Leg length: 91 cm bilateral ASIS to medial malleolus   FUNCTIONAL TESTS:  SLS: 20 seconds each; Rt foot ER   GAIT: Distance walked: 20 ft  Assistive device utilized: None Level of assistance: Complete Independence Comments: Rt foot ER    [facility] Adult PT Treatment:  DATE: 03/18/2024 Therapeutic Exercise: Resisted ankle DF & Inver + GTB 2x10 each Seated resisted knee extension with RTB --> discontinued d/t knee pain Prone quad stretch w/strap 10x10 Neuromuscular re-ed: Hooklying hip add isometric with ball squeeze 10x5 Side Lying: Bent knee hip abd + blue TB x10 Straight leg hip abd + blue TB x10  Bridges + blue TB --> added hip abd at top  Prone: Straight leg hip ext x10 Bent knee hip ext x10   [facility] Adult PT Treatment:                                                DATE: 03/11/24 Therapeutic Exercise: Calf stretch x 30 sec each gastroc SL calf raise 2 x 10  Lateral band walks d/c due to knee pain Resisted ankle inversion 2 x 10 green band Resisted ankle DF 2 x 10 green band  Neuromuscular re-ed: SLR x 10 each  Sidelying hip abduction x 10  Prone hip extension x 10  LAQ x 10; 2 lbs  SLS 2 x 30 sec Hip bridge with abduction blue band 2 x 10                                                                                                                               [facility] Adult PT Treatment:                                                DATE:  02/15/24 Therapeutic Exercise: Demonstrated,performed, and issued initial HEP.     PATIENT EDUCATION:  Education details: HEP update  Person educated: Patient Education method: Explanation, Demonstration, Tactile cues, Verbal cues, and Handouts Education comprehension: verbalized understanding, returned demonstration, verbal cues required, tactile cues required, and needs further education  HOME EXERCISE PROGRAM: Access Code: [email or web address] URL: [email or web address] Date: 03/18/2024 Prepared by: Lamarr Price  Exercises - Supine Active Straight Leg Raise  - 1 x daily - 7 x weekly - 2 sets - 10 reps - Sidelying Hip Abduction  - 1 x daily - 7 x weekly - 2 sets - 10 reps - Seated Long Arc Quad  - 1 x daily - 7 x weekly - 2 sets - 10 reps - Prone Hip Extension  - 1 x daily - 7 x weekly - 2 sets - 10 reps - Supine Quadriceps Stretch with Strap on Table  - 1 x daily - 7 x weekly - 3 sets - 30 sec  hold - Seated Ankle Inversion with Resistance and Legs Crossed  - 1 x daily - 7 x weekly - 2 sets - 10 reps - Ankle Dorsiflexion with Resistance  - 1 x daily - 7  x weekly - 2 sets - 10 reps - Gastroc Stretch on Wall  - 1 x daily - 7 x weekly - 3 sets - 30 sec  hold - Bridge with Hip Abduction and Resistance  - 1 x daily - 7 x weekly - 2 sets - 10 reps - Sidelying Bent Knee Lift at 45 Degrees  - 1 x daily - 7 x weekly - 3 sets - 10 reps - Straight Leg Raise with External Rotation  - 1 x daily - 7 x weekly - 3 sets - 10 reps - 5 sec hold - Prone Hip Extension with Bent Knee  - 1 x daily - 7 x weekly - 3 sets - 10 reps - Prone Quadriceps Stretch with Strap  - 1-2 x daily - 7 x weekly - 1 sets - 10-3 reps - 10-30 sec hold  ASSESSMENT:  CLINICAL IMPRESSION: HEP reviewed as needed to clarify body mechanics/alignment and address knee pain. Cueing pelvic stability and TA activation alleviated low back discomfort and improved postural alignment; patient had difficulty maintaining core  stabilization and had tendency to compensate with low back. HEP updated with focused glute strengthening and quad stretch. Will incorporate focused core strengthening next visit to address weakness.  EVAL:Patient is a 58 y.o. female who was seen today for physical therapy evaluation and treatment for chronic Rt knee pain. Patient reports onset of knee pain, anterior thigh pain, and Rt foot paresthesia that began about a year ago. She does not recall a MOI to the knee, but did experience a Rt foot injury about 2 years ago that resulted in prolonged immobilization of the foot. Upon assessment she is noted to have Rt knee weakness, Rt ankle weakness, bilateral hip weakness, gait abnormalities, quad/hamstring tightness, and positive McMurray's special test and medial joint line tenderness. She will benefit from skilled PT to address the above stated deficits in order to optimize her function and assist in overall pain reduction.    OBJECTIVE IMPAIRMENTS: Abnormal gait, decreased activity tolerance, decreased balance, decreased endurance, decreased knowledge of condition, difficulty walking, decreased strength, increased edema, increased fascial restrictions, impaired flexibility, impaired sensation, improper body mechanics, postural dysfunction, and pain.   ACTIVITY LIMITATIONS: carrying, lifting, bending, sitting, standing, squatting, stairs, transfers, and locomotion level  PARTICIPATION LIMITATIONS: cleaning, laundry, shopping, community activity, occupation, and yard work  PERSONAL FACTORS: Age, Fitness, Profession, Time since onset of injury/illness/exacerbation, and 3+ comorbidities: see PMH above are also affecting patient's functional outcome.   REHAB POTENTIAL: Good  CLINICAL DECISION MAKING: Evolving/moderate complexity  EVALUATION COMPLEXITY: Moderate   GOALS: Goals reviewed with patient? Yes  SHORT TERM GOALS: Target date: 03/14/2024  Patient will be independent and compliant with  initial HEP.  Baseline: issued at eval  Goal status: MET  2.  Patient will maintain SLS on the RLE with proper alignment for 30 seconds to improve stability.  Baseline: Rt foot ER 20 seconds 03/11/24: 30 seconds proper alignment  Goal status: MET  3.  Patient will report pain at worst rated as </= 3/10 indicative of improvement in current condition.  Baseline: 5 03/11/24: 3 Goal status: MET   LONG TERM GOALS: Target date: 04/16/24  Patient will score >/= 60/80 on the LEFS (MCID is 9) to signify clinically meaningful improvement in functional abilities.  Baseline: see above Goal status: INITIAL  2.  Patient will demonstrate 5/5 Rt knee strength to improve gait stability.  Baseline: see above  Goal status: INITIAL  3.  Patient will demonstrate 5/5  Rt ankle strength to improve stability when walking on uneven surfaces.  Baseline: see above Goal status: INITIAL  4.  Patient will demonstrate 5/5 bilateral hip strength to improve stability about the chain with prolonged walking/standing activity.  Baseline: see above  Goal status: INITIAL    PLAN:  PT FREQUENCY: 1-2x/week  PT DURATION: 8 weeks  PLANNED INTERVENTIONS: 97164- PT Re-evaluation, 97750- Physical Performance Testing, 97110-Therapeutic exercises, 97530- Therapeutic activity, [id number]- Neuromuscular re-education, 97535- Self Care, [id number]- Manual therapy, [id number]- Gait training, [phone]- Aquatic Therapy, [phone]- Electrical stimulation (unattended), [phone]- Electrical stimulation (manual), 97016- Vasopneumatic device, [id number]- Ionotophoresis 4mg /ml Dexamethasone , Taping, Dry Needling, Cryotherapy, and Moist heat     Lamarr Price, PTA 03/18/24 8:43 AM  Lucie Meeter, PT, DPT, ATC 05/27/24 8:19 AM

## 2024-03-23 ENCOUNTER — Encounter

## 2024-03-28 ENCOUNTER — Ambulatory Visit

## 2024-04-07 ENCOUNTER — Other Ambulatory Visit: Payer: Self-pay | Admitting: Medical-Surgical

## 2024-04-08 ENCOUNTER — Ambulatory Visit: Attending: Sports Medicine

## 2024-04-08 DIAGNOSIS — R29898 Other symptoms and signs involving the musculoskeletal system: Secondary | ICD-10-CM | POA: Insufficient documentation

## 2024-04-08 DIAGNOSIS — R2689 Other abnormalities of gait and mobility: Secondary | ICD-10-CM | POA: Insufficient documentation

## 2024-04-08 DIAGNOSIS — M25561 Pain in right knee: Secondary | ICD-10-CM | POA: Insufficient documentation

## 2024-04-08 DIAGNOSIS — R202 Paresthesia of skin: Secondary | ICD-10-CM | POA: Insufficient documentation

## 2024-04-08 DIAGNOSIS — G8929 Other chronic pain: Secondary | ICD-10-CM | POA: Insufficient documentation

## 2024-04-08 DIAGNOSIS — R600 Localized edema: Secondary | ICD-10-CM | POA: Insufficient documentation

## 2024-04-08 NOTE — Therapy (Incomplete)
 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 08/13/2017   GI bleed 04/30/2022   History of gastric bypass 04/25/2019   Hypertension    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   Microcytic anemia 08/11/2017   Obesity    Secondary hyperparathyroidism, non-renal (HCC) 03/13/2019   Vitamin A  deficiency 03/13/2019   Past Surgical History:  Procedure Laterality Date   ABLATION     ESOPHAGOGASTRODUODENOSCOPY (EGD) WITH PROPOFOL  N/A 05/27/2022   Procedure: ESOPHAGOGASTRODUODENOSCOPY (EGD) WITH PROPOFOL ;  Surgeon: Rollin Dover, MD;  Location: [facility];  Service: Gastroenterology;  Laterality: N/A;   GASTRIC BYPASS     SHOULDER ARTHROSCOPY     TUMOR REMOVAL     right hand   Patient Active Problem List   Diagnosis Date Noted   Chronic pain of right knee 02/05/2024   Dyslipidemia 01/07/2024   Vitamin D deficiency 11/06/2022   Prediabetes 11/06/2022   IDA (iron  deficiency anemia) 04/25/2019   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   Postmenopausal 04/13/2019   Vitamin A  deficiency 03/13/2019   Secondary hyperparathyroidism, non-renal (HCC) 03/13/2019   Caregiver stress 08/10/2018   Generalized anxiety disorder with panic attacks 07/13/2018   Empty sella turcica (HCC) 08/14/2017   Word finding difficulty 08/11/2017   Memory loss of unknown cause 08/11/2017   Moderate episode of recurrent major depressive disorder (HCC) 08/10/2017   Intestinal malabsorption following gastrectomy 03/13/2015    PCP: Willo Mini, NP  REFERRING PROVIDER: Curtis Debby PARAS, MD   REFERRING DIAG: [phone] (ICD-10-CM) - Chronic pain of right knee   THERAPY DIAG:  No diagnosis found.  Rationale for  Evaluation and Treatment: Rehabilitation  ONSET DATE: chronic   SUBJECTIVE:   SUBJECTIVE STATEMENT: Patient reports her parasthesia is almost all gone; states the only problem she has is with the banded exercises (sit to stand with band). Patient states her knee is not giving out but yesterday noticed popping again in knee but no pain present.   EVAL:Patient reports Rt knee pain has been going on for well over a year. She reports 2 years ago she had internal bleeding and passed out in the shower and injured the Rt foot, but didn't have a specific injury to the Rt knee. She tore a ligament, but didn't fracture the foot. She was in a boot for 10 months, but did not undergo surgery. The knee pain was intermittent to begin with, but for the past 2 months the pain became more frequent. The knee will give out, but she hasn't had a fall. Today the knee feels ok. Occasional popping that is painful. She also reports ache in the anterior thigh that began around the same time as the knee pain. The thigh pain is present after sitting for an hour and at night when she is going to sleep. The bottom of the Rt foot can be tingling when sitting. If she walks the tingling will go away. The thigh pain and the Rt foot paresthesia began around the same time as the knee.   PERTINENT HISTORY: Anemia  PAIN:  Are you having pain? Yes: NPRS scale: none currently; at worst 3 Pain location: Rt medial knee Pain description: ache Aggravating factors: overactivity Relieving factors: laying  down, stretching   PRECAUTIONS: None  RED FLAGS: None   WEIGHT BEARING RESTRICTIONS: No  FALLS:  Has patient fallen in last 6 months? No  LIVING ENVIRONMENT: Lives with: lives with their family Lives in: House/apartment Stairs: No Has following equipment at home: None  OCCUPATION: HR-desk job   PLOF: Independent  PATIENT GOALS: learn what to do to strengthen the knee and I don't want to fall.   NEXT MD VISIT:  03/18/24  OBJECTIVE:  Note: Objective measures were completed at Evaluation unless otherwise noted.  DIAGNOSTIC FINDINGS: Rt knee X-ray: FINDINGS: No evidence of fracture or dislocation. Trace knee joint effusion. Joint spaces are preserved. No evidence of arthropathy or other focal bone abnormality. Soft tissues are unremarkable.   IMPRESSION: Trace knee joint effusion. No acute osseous abnormality.  PATIENT SURVEYS:  LEFS 47/80  COGNITION: Overall cognitive status: Within functional limits for tasks assessed     SENSATION: WFL  EDEMA:  Mild swelling Rt knee   MUSCLE LENGTH: Hamstrings: moderate tightness bilaterally   POSTURE: Rt foot ER in standing  PALPATION: Rt knee medial joint line tenderness   LOWER EXTREMITY ROM:  Active ROM Right eval Left eval  Hip flexion    Hip extension    Hip abduction    Hip adduction    Hip internal rotation    Hip external rotation    Knee flexion WNL   Knee extension WNL   Ankle dorsiflexion    Ankle plantarflexion    Ankle inversion    Ankle eversion     (Blank rows = not tested)  Lumbar AROM: WFL and pain free  LOWER EXTREMITY MMT:  MMT Right eval Left eval  Hip flexion 4 4  Hip extension 4- 4  Hip abduction 4 4+  Hip adduction    Hip internal rotation    Hip external rotation    Knee flexion 4- pain 5  Knee extension 4- pain  5  Ankle dorsiflexion 4 5  Ankle plantarflexion 5 seated 5  Ankle inversion 4 5  Ankle eversion 5 5   (Blank rows = not tested)  LOWER EXTREMITY SPECIAL TESTS:  (+) McMurray's (+) Ely's RLE  (-) SLR   Leg length: 91 cm bilateral ASIS to medial malleolus   FUNCTIONAL TESTS:  SLS: 20 seconds each; Rt foot ER   GAIT: Distance walked: 20 ft  Assistive device utilized: None Level of assistance: Complete Independence Comments: Rt foot ER    [facility] Adult PT Treatment:                                                DATE: 03/18/2024 Therapeutic Exercise: Resisted ankle DF &  Inver + GTB 2x10 each Seated resisted knee extension with RTB --> discontinued d/t knee pain Prone quad stretch w/strap 10x10 Neuromuscular re-ed: Hooklying hip add isometric with ball squeeze 10x5 Side Lying: Bent knee hip abd + blue TB x10 Straight leg hip abd + blue TB x10  Bridges + blue TB --> added hip abd at top  Prone: Straight leg hip ext x10 Bent knee hip ext x10   [facility] Adult PT Treatment:  DATE: 03/11/24 Therapeutic Exercise: Calf stretch x 30 sec each gastroc SL calf raise 2 x 10  Lateral band walks d/c due to knee pain Resisted ankle inversion 2 x 10 green band Resisted ankle DF 2 x 10 green band  Neuromuscular re-ed: SLR x 10 each  Sidelying hip abduction x 10  Prone hip extension x 10  LAQ x 10; 2 lbs  SLS 2 x 30 sec Hip bridge with abduction blue band 2 x 10                                                                                                                               [facility] Adult PT Treatment:                                                DATE: 02/15/24 Therapeutic Exercise: Demonstrated,performed, and issued initial HEP.     PATIENT EDUCATION:  Education details: HEP update  Person educated: Patient Education method: Explanation, Demonstration, Tactile cues, Verbal cues, and Handouts Education comprehension: verbalized understanding, returned demonstration, verbal cues required, tactile cues required, and needs further education  HOME EXERCISE PROGRAM: Access Code: [email or web address] URL: [email or web address] Date: 03/18/2024 Prepared by: Lamarr Price  Exercises - Supine Active Straight Leg Raise  - 1 x daily - 7 x weekly - 2 sets - 10 reps - Sidelying Hip Abduction  - 1 x daily - 7 x weekly - 2 sets - 10 reps - Seated Long Arc Quad  - 1 x daily - 7 x weekly - 2 sets - 10 reps - Prone Hip Extension  - 1 x daily - 7 x weekly - 2 sets - 10 reps - Supine Quadriceps Stretch with Strap  on Table  - 1 x daily - 7 x weekly - 3 sets - 30 sec  hold - Seated Ankle Inversion with Resistance and Legs Crossed  - 1 x daily - 7 x weekly - 2 sets - 10 reps - Ankle Dorsiflexion with Resistance  - 1 x daily - 7 x weekly - 2 sets - 10 reps - Gastroc Stretch on Wall  - 1 x daily - 7 x weekly - 3 sets - 30 sec  hold - Bridge with Hip Abduction and Resistance  - 1 x daily - 7 x weekly - 2 sets - 10 reps - Sidelying Bent Knee Lift at 45 Degrees  - 1 x daily - 7 x weekly - 3 sets - 10 reps - Straight Leg Raise with External Rotation  - 1 x daily - 7 x weekly - 3 sets - 10 reps - 5 sec hold - Prone Hip Extension with Bent Knee  - 1 x daily - 7 x weekly - 3 sets - 10 reps - Prone Quadriceps Stretch with Strap  - 1-2 x  daily - 7 x weekly - 1 sets - 10-3 reps - 10-30 sec hold  ASSESSMENT:  CLINICAL IMPRESSION: HEP reviewed as needed to clarify body mechanics/alignment and address knee pain. Cueing pelvic stability and TA activation alleviated low back discomfort and improved postural alignment; patient had difficulty maintaining core stabilization and had tendency to compensate with low back. HEP updated with focused glute strengthening and quad stretch. Will incorporate focused core strengthening next visit to address weakness.  EVAL:Patient is a 58 y.o. female who was seen today for physical therapy evaluation and treatment for chronic Rt knee pain. Patient reports onset of knee pain, anterior thigh pain, and Rt foot paresthesia that began about a year ago. She does not recall a MOI to the knee, but did experience a Rt foot injury about 2 years ago that resulted in prolonged immobilization of the foot. Upon assessment she is noted to have Rt knee weakness, Rt ankle weakness, bilateral hip weakness, gait abnormalities, quad/hamstring tightness, and positive McMurray's special test and medial joint line tenderness. She will benefit from skilled PT to address the above stated deficits in order to optimize  her function and assist in overall pain reduction.    OBJECTIVE IMPAIRMENTS: Abnormal gait, decreased activity tolerance, decreased balance, decreased endurance, decreased knowledge of condition, difficulty walking, decreased strength, increased edema, increased fascial restrictions, impaired flexibility, impaired sensation, improper body mechanics, postural dysfunction, and pain.   ACTIVITY LIMITATIONS: carrying, lifting, bending, sitting, standing, squatting, stairs, transfers, and locomotion level  PARTICIPATION LIMITATIONS: cleaning, laundry, shopping, community activity, occupation, and yard work  PERSONAL FACTORS: Age, Fitness, Profession, Time since onset of injury/illness/exacerbation, and 3+ comorbidities: see PMH above are also affecting patient's functional outcome.   REHAB POTENTIAL: Good  CLINICAL DECISION MAKING: Evolving/moderate complexity  EVALUATION COMPLEXITY: Moderate   GOALS: Goals reviewed with patient? Yes  SHORT TERM GOALS: Target date: 03/14/2024  Patient will be independent and compliant with initial HEP.  Baseline: issued at eval  Goal status: MET  2.  Patient will maintain SLS on the RLE with proper alignment for 30 seconds to improve stability.  Baseline: Rt foot ER 20 seconds 03/11/24: 30 seconds proper alignment  Goal status: MET  3.  Patient will report pain at worst rated as </= 3/10 indicative of improvement in current condition.  Baseline: 5 03/11/24: 3 Goal status: MET   LONG TERM GOALS: Target date: 04/16/24  Patient will score >/= 60/80 on the LEFS (MCID is 9) to signify clinically meaningful improvement in functional abilities.  Baseline: see above Goal status: INITIAL  2.  Patient will demonstrate 5/5 Rt knee strength to improve gait stability.  Baseline: see above  Goal status: INITIAL  3.  Patient will demonstrate 5/5 Rt ankle strength to improve stability when walking on uneven surfaces.  Baseline: see above Goal status:  INITIAL  4.  Patient will demonstrate 5/5 bilateral hip strength to improve stability about the chain with prolonged walking/standing activity.  Baseline: see above  Goal status: INITIAL    PLAN:  PT FREQUENCY: 1-2x/week  PT DURATION: 8 weeks  PLANNED INTERVENTIONS: 97164- PT Re-evaluation, 97750- Physical Performance Testing, 97110-Therapeutic exercises, 97530- Therapeutic activity, [id number]- Neuromuscular re-education, 97535- Self Care, 02859- Manual therapy, [id number]- Gait training, [phone]- Aquatic Therapy, [phone]- Electrical stimulation (unattended), [id number]- Electrical stimulation (manual), 97016- Vasopneumatic device, [id number]- Ionotophoresis 4mg /ml Dexamethasone , Taping, Dry Needling, Cryotherapy, and Moist heat  PLAN FOR NEXT SESSION: Core strengthening; hip, knee, ankle strengthening; gait training working to correct foot ER.  Cadence Haslam, PT,  DPT, ATC 04/08/24 7:55 AM

## 2024-05-01 ENCOUNTER — Other Ambulatory Visit: Payer: Self-pay | Admitting: Medical-Surgical

## 2024-05-31 ENCOUNTER — Encounter: Payer: Self-pay | Admitting: Sports Medicine

## 2024-07-08 ENCOUNTER — Ambulatory Visit: Admitting: Medical-Surgical

## 2024-07-20 ENCOUNTER — Encounter: Payer: Self-pay | Admitting: Medical-Surgical

## 2024-07-20 ENCOUNTER — Ambulatory Visit (INDEPENDENT_AMBULATORY_CARE_PROVIDER_SITE_OTHER): Admitting: Medical-Surgical

## 2024-07-20 VITALS — BP 120/77 | HR 70 | Resp 20 | Ht 64.0 in | Wt 152.0 lb

## 2024-07-20 DIAGNOSIS — E211 Secondary hyperparathyroidism, not elsewhere classified: Secondary | ICD-10-CM

## 2024-07-20 DIAGNOSIS — E236 Other disorders of pituitary gland: Secondary | ICD-10-CM | POA: Diagnosis not present

## 2024-07-20 DIAGNOSIS — E66812 Obesity, class 2: Secondary | ICD-10-CM

## 2024-07-20 DIAGNOSIS — D509 Iron deficiency anemia, unspecified: Secondary | ICD-10-CM

## 2024-07-20 DIAGNOSIS — E559 Vitamin D deficiency, unspecified: Secondary | ICD-10-CM

## 2024-07-20 DIAGNOSIS — F5101 Primary insomnia: Secondary | ICD-10-CM | POA: Insufficient documentation

## 2024-07-20 DIAGNOSIS — Z6836 Body mass index (BMI) 36.0-36.9, adult: Secondary | ICD-10-CM | POA: Insufficient documentation

## 2024-07-20 DIAGNOSIS — T753XXA Motion sickness, initial encounter: Secondary | ICD-10-CM

## 2024-07-20 DIAGNOSIS — M25561 Pain in right knee: Secondary | ICD-10-CM

## 2024-07-20 DIAGNOSIS — Z1231 Encounter for screening mammogram for malignant neoplasm of breast: Secondary | ICD-10-CM

## 2024-07-20 DIAGNOSIS — G8929 Other chronic pain: Secondary | ICD-10-CM

## 2024-07-20 DIAGNOSIS — F331 Major depressive disorder, recurrent, moderate: Secondary | ICD-10-CM | POA: Diagnosis not present

## 2024-07-20 DIAGNOSIS — R7303 Prediabetes: Secondary | ICD-10-CM

## 2024-07-20 DIAGNOSIS — E785 Hyperlipidemia, unspecified: Secondary | ICD-10-CM

## 2024-07-20 DIAGNOSIS — D508 Other iron deficiency anemias: Secondary | ICD-10-CM

## 2024-07-20 MED ORDER — WEGOVY 1.7 MG/0.75ML ~~LOC~~ SOAJ: 1.7000 mg | SUBCUTANEOUS | 1 refills | Status: AC

## 2024-07-20 MED ORDER — SCOPOLAMINE 1 MG/3DAYS TD PT72: 1.0000 | MEDICATED_PATCH | TRANSDERMAL | 1 refills | Status: AC

## 2024-07-20 NOTE — Assessment & Plan Note (Signed)
 Chronic right knee pain manageable with occasional brace use.  - Consider return to physical therapy if symptoms worsen.

## 2024-07-20 NOTE — Assessment & Plan Note (Signed)
 Review of last labs shows iron  saturation was still a bit low.  Rechecking CBC and iron  panel today.

## 2024-07-20 NOTE — Progress Notes (Signed)
 Established patient visit   History of Present Illness   Discussed the use of AI scribe software for clinical note transcription with the patient, who gave verbal consent to proceed.  History of Present Illness   Meagan Floyd is a 58 year old female who presents for a weight check and medication management.  Weight management and anti-obesity pharmacotherapy - On Wegovy  for over a year - Weight loss of 62 pounds since February 8th 2024 (from 214 pounds to 152 pounds) - Current BMI is 26.1 - Experiences mild nausea two days after Wegovy  administration but manageable - Currently on 1.7 mg dose of Wegovy   Sleep disturbance - Takes trazodone  50mg  nightly for sleep initiation - Able to fall asleep quickly - Wakes up in the middle of the night for 1-2 hours  Mood and mental health - Takes Cymbalta  60 mg daily, effective - No thoughts of self-harm or harm to others  Micronutrient deficiencies and prediabetes - Iron  deficiency, vitamin D deficiency, and prediabetes are being monitored  Knee pain and physical activity - Knee pain is manageable with a brace - Difficulty attending physical therapy due to family health issues and recent COVID-19 infection - Engages in walking and resistance band exercises       Physical Exam   Physical Exam Vitals reviewed.  Constitutional:      General: She is not in acute distress.    Appearance: Normal appearance. She is not ill-appearing.  HENT:     Head: Normocephalic and atraumatic.  Cardiovascular:     Rate and Rhythm: Normal rate and regular rhythm.     Pulses: Normal pulses.     Heart sounds: Normal heart sounds. No murmur heard.    No friction rub. No gallop.  Pulmonary:     Effort: Pulmonary effort is normal. No respiratory distress.     Breath sounds: Normal breath sounds. No wheezing.  Skin:    General: Skin is warm and dry.  Neurological:     Mental Status: She is alert and oriented to person, place, and time.   Psychiatric:        Mood and Affect: Mood normal.        Behavior: Behavior normal.        Thought Content: Thought content normal.        Judgment: Judgment normal.    Assessment & Plan   Problem List Items Addressed This Visit       Endocrine   Secondary hyperparathyroidism, non-renal   Asymptomatic with no concerns today.  Checking CMP and PTH today.      Relevant Orders   CMP14+EGFR   Parathyroid hormone, intact (no Ca)     Musculoskeletal and Integument   Empty sella turcica   Incidental finding of partially empty sella turcica on MRI in 2018. Asymptomatic.         Other   Chronic iron  deficiency anemia   Relevant Orders   CBC   Iron , TIBC and Ferritin Panel   Chronic pain of right knee   Chronic right knee pain manageable with occasional brace use.  - Consider return to physical therapy if symptoms worsen.      Class 2 severe obesity due to excess calories with serious comorbidity and body mass index (BMI) of 36.0 to 36.9 in adult - Primary   Obesity with significant weight loss managed with Wegovy  1.7 mg. BMI decreased from 36.83 to 26.08 over 1.5 years. Mild nausea post-injection, no other side effects.  Recent 12-pound loss since last visit. - Continue Wegovy  1.7 mg. - Monitor weight and BMI. - Encourage current exercise regimen.      Relevant Medications   WEGOVY  1.7 MG/0.75ML SOAJ SQ injection   Dyslipidemia   No current medication for management of dyslipidemia.  Working on dietary modification, regular intentional exercise, and weight loss. - Checking lipids today.      Relevant Orders   Lipid panel   IDA (iron  deficiency anemia)   Review of last labs shows iron  saturation was still a bit low.  Rechecking CBC and iron  panel today.      Moderate episode of recurrent major depressive disorder (HCC)   Depression well-managed on Cymbalta  60 mg daily. Mood stable, no self-harm thoughts. - Continue Cymbalta  60 mg daily.      Prediabetes   History  of prediabetes well-controlled since starting Wegovy .  Rechecking hemoglobin A1c today.      Relevant Orders   Hemoglobin A1c   Primary insomnia   Insomnia managed with trazodone . Experiencing middle-of-the-night awakenings. Open to dose adjustment. - Adjust trazodone  dose to75-100mg  nightly as needed. - Monitor sleep quality and adjust prescription.      Vitamin D deficiency   History of vitamin D deficiency not currently on supplementation.  Rechecking vitamin D levels.      Relevant Orders   VITAMIN D 25 Hydroxy (Vit-D Deficiency, Fractures)   Other Visit Diagnoses       Encounter for screening mammogram for malignant neoplasm of breast       Relevant Orders   MM DIGITAL SCREENING BILATERAL     Motion sickness, initial encounter       Relevant Medications   scopolamine  (TRANSDERM-SCOP) 1 MG/3DAYS      Motion sickness Has an upcoming trip planned where they will likely take an excursion on the see.  History of motion sickness successfully treated with scopolamine  patches.  Refilling prescription for patches today.  Breast cancer screening Mammogram ordered.    Follow up   Return in about 6 months (around 01/18/2025) for chronic disease follow up. __________________________________ Zada FREDRIK Palin, DNP, APRN, FNP-BC Primary Care and Sports Medicine [facility]

## 2024-07-20 NOTE — Assessment & Plan Note (Signed)
 Incidental finding of partially empty sella turcica on MRI in 2018. Asymptomatic.

## 2024-07-20 NOTE — Assessment & Plan Note (Signed)
 Obesity with significant weight loss managed with Wegovy  1.7 mg. BMI decreased from 36.83 to 26.08 over 1.5 years. Mild nausea post-injection, no other side effects. Recent 12-pound loss since last visit. - Continue Wegovy  1.7 mg. - Monitor weight and BMI. - Encourage current exercise regimen.

## 2024-07-20 NOTE — Assessment & Plan Note (Signed)
 Insomnia managed with trazodone . Experiencing middle-of-the-night awakenings. Open to dose adjustment. - Adjust trazodone  dose to75-100mg  nightly as needed. - Monitor sleep quality and adjust prescription.

## 2024-07-20 NOTE — Assessment & Plan Note (Signed)
 History of vitamin D deficiency not currently on supplementation.  Rechecking vitamin D levels.

## 2024-07-20 NOTE — Assessment & Plan Note (Signed)
 History of prediabetes well-controlled since starting Wegovy .  Rechecking hemoglobin A1c today.

## 2024-07-20 NOTE — Assessment & Plan Note (Signed)
 No current medication for management of dyslipidemia.  Working on dietary modification, regular intentional exercise, and weight loss. - Checking lipids today.

## 2024-07-20 NOTE — Assessment & Plan Note (Signed)
 Asymptomatic with no concerns today.  Checking CMP and PTH today.

## 2024-07-20 NOTE — Assessment & Plan Note (Signed)
 Depression well-managed on Cymbalta  60 mg daily. Mood stable, no self-harm thoughts. - Continue Cymbalta  60 mg daily.

## 2024-07-21 ENCOUNTER — Ambulatory Visit: Payer: Self-pay | Admitting: Medical-Surgical

## 2024-07-21 DIAGNOSIS — D509 Iron deficiency anemia, unspecified: Secondary | ICD-10-CM

## 2024-07-21 DIAGNOSIS — E559 Vitamin D deficiency, unspecified: Secondary | ICD-10-CM

## 2024-07-21 LAB — IRON,TIBC AND FERRITIN PANEL
Ferritin: 14 ng/mL — ABNORMAL LOW (ref 15–150)
Iron Saturation: 17 % (ref 15–55)
Iron: 59 ug/dL (ref 27–159)
Total Iron Binding Capacity: 340 ug/dL (ref 250–450)
UIBC: 281 ug/dL (ref 131–425)

## 2024-07-21 LAB — CBC
Hematocrit: 38.5 % (ref 34.0–46.6)
Hemoglobin: 11.9 g/dL (ref 11.1–15.9)
MCH: 27.5 pg (ref 26.6–33.0)
MCHC: 30.9 g/dL — ABNORMAL LOW (ref 31.5–35.7)
MCV: 89 fL (ref 79–97)
Platelets: 314 x10E3/uL (ref 150–450)
RBC: 4.33 x10E6/uL (ref 3.77–5.28)
RDW: 13.9 % (ref 11.7–15.4)
WBC: 6.2 x10E3/uL (ref 3.4–10.8)

## 2024-07-21 LAB — CMP14+EGFR
ALT: 20 IU/L (ref 0–32)
AST: 24 IU/L (ref 0–40)
Albumin: 4.1 g/dL (ref 3.8–4.9)
Alkaline Phosphatase: 73 IU/L (ref 49–135)
BUN/Creatinine Ratio: 30 — ABNORMAL HIGH (ref 9–23)
BUN: 21 mg/dL (ref 6–24)
Bilirubin Total: 0.3 mg/dL (ref 0.0–1.2)
CO2: 23 mmol/L (ref 20–29)
Calcium: 9.5 mg/dL (ref 8.7–10.2)
Chloride: 103 mmol/L (ref 96–106)
Creatinine, Ser: 0.71 mg/dL (ref 0.57–1.00)
Globulin, Total: 2 g/dL (ref 1.5–4.5)
Glucose: 83 mg/dL (ref 70–99)
Potassium: 4.2 mmol/L (ref 3.5–5.2)
Sodium: 142 mmol/L (ref 134–144)
Total Protein: 6.1 g/dL (ref 6.0–8.5)
eGFR: 100 mL/min/1.73 (ref >59)

## 2024-07-21 LAB — LIPID PANEL
Chol/HDL Ratio: 2.8 ratio (ref 0.0–4.4)
Cholesterol, Total: 186 mg/dL (ref 100–199)
HDL: 66 mg/dL (ref >39)
LDL Chol Calc (NIH): 108 mg/dL — ABNORMAL HIGH (ref 0–99)
Triglycerides: 65 mg/dL (ref 0–149)
VLDL Cholesterol Cal: 12 mg/dL (ref 5–40)

## 2024-07-21 LAB — VITAMIN D 25 HYDROXY (VIT D DEFICIENCY, FRACTURES): Vit D, 25-Hydroxy: 17.3 ng/mL — ABNORMAL LOW (ref 30.0–100.0)

## 2024-07-21 LAB — HEMOGLOBIN A1C
Est. average glucose Bld gHb Est-mCnc: 105 mg/dL
Hgb A1c MFr Bld: 5.3 % (ref 4.8–5.6)

## 2024-07-21 MED ORDER — VITAMIN D (ERGOCALCIFEROL) 1.25 MG (50000 UNIT) PO CAPS: 50000.0000 [IU] | ORAL_CAPSULE | ORAL | 0 refills | Status: AC

## 2024-07-21 MED ORDER — FERROUS SULFATE 325 (65 FE) MG PO TBEC: 325.0000 mg | DELAYED_RELEASE_TABLET | Freq: Every day | ORAL | 3 refills | Status: DC

## 2024-07-22 LAB — PARATHYROID HORMONE, INTACT (NO CA): PTH: 56 pg/mL (ref 15–65)

## 2024-08-08 ENCOUNTER — Ambulatory Visit

## 2024-09-16 ENCOUNTER — Other Ambulatory Visit: Payer: Self-pay | Admitting: Medical-Surgical

## 2024-09-16 DIAGNOSIS — T753XXA Motion sickness, initial encounter: Secondary | ICD-10-CM

## 2024-10-02 ENCOUNTER — Encounter: Payer: Self-pay | Admitting: Medical-Surgical

## 2024-10-04 ENCOUNTER — Encounter: Payer: Self-pay | Admitting: Medical-Surgical

## 2024-10-04 DIAGNOSIS — F5101 Primary insomnia: Secondary | ICD-10-CM | POA: Diagnosis not present

## 2024-10-04 DIAGNOSIS — J0100 Acute maxillary sinusitis, unspecified: Secondary | ICD-10-CM

## 2024-10-04 MED ORDER — TRAZODONE HCL 50 MG PO TABS: 50.0000 mg | ORAL_TABLET | Freq: Every evening | ORAL | 3 refills | Status: AC | PRN

## 2024-10-04 MED ORDER — CEFDINIR 300 MG PO CAPS: 300.0000 mg | ORAL_CAPSULE | Freq: Two times a day (BID) | ORAL | 0 refills | Status: AC

## 2024-10-04 NOTE — Progress Notes (Signed)
 Virtual Visit via Video Note  I connected with Meagan Floyd on 10/04/2024 at  4:00 PM EST by a video enabled telemedicine application and verified that I am speaking with the correct person using two identifiers.   I discussed the limitations of evaluation and management by telemedicine and the availability of in person appointments. The patient expressed understanding and agreed to proceed.  Patient location: home Provider locations: office  Subjective:    Discussed the use of AI scribe software for clinical note transcription with the patient, who gave verbal consent to proceed.  History of Present Illness   Meagan Floyd is a 60 year old female who presents with worsening sinus pressure and dental pain.  Sinus pressure and nasal discharge - Sinus pressure present since December 26, progressively worsening - Thick yellow nasal discharge - Low-grade nighttime fevers and chills  Dental pain - Severe dental pain for 3 days - Described as a 'shovel to the face' feeling  Symptom management and medication tolerance - Tylenol  used without relief - Allergies to Bactrim  and penicillin - No prior issues tolerating other antibiotics   Insomnia - After her last visit, she trialed 75 mg of trazodone  nightly with good effect - Running out of her supply too quickly and would like updated prescription    Past medical history, Surgical history, Family history not pertinant except as noted below, Social history, Allergies, and medications have been entered into the medical record, reviewed, and corrections made.   Review of Systems: See HPI for pertinent positives and negatives.   Objective:    General: Speaking clearly in complete sentences without any shortness of breath.  Alert and oriented x3.  Normal judgment. No apparent acute distress.  Impression and Recommendations:    Acute nonrecurrent maxillary sinusitis Sinus pressure with dental pain, fever, chills, and yellow nasal  discharge. Tylenol  ineffective. - Prescribed Omnicef  BID for 7 days. - Recommended Mucinex for secretion thinning. - Advised saline sprays and rinses. - Suggested Tylenol  or ibuprofen for pain.  Primary insomnia Trazodone  at 75 mg has proven more effective than the 50 mg dose however she is running out of her supply too quickly.   - Adjusted trazodone  prescription to allow 1-2 tablets (50-100 mg) nightly as needed for sleep.  I discussed the assessment and treatment plan with the patient. The patient was provided an opportunity to ask questions and all were answered. The patient agreed with the plan and demonstrated an understanding of the instructions.   The patient was advised to call back or seek an in-person evaluation if the symptoms worsen or if the condition fails to improve as anticipated.  Return for Follow-up as scheduled in April.  Zada FREDRIK Palin, DNP, APRN, FNP-BC [facility] and [facility] Medicine

## 2024-10-17 ENCOUNTER — Encounter: Payer: Self-pay | Admitting: Family

## 2024-10-17 ENCOUNTER — Other Ambulatory Visit (HOSPITAL_COMMUNITY): Payer: Self-pay

## 2024-10-17 ENCOUNTER — Telehealth: Payer: Self-pay

## 2024-10-17 NOTE — Telephone Encounter (Signed)
 Pharmacy Patient Advocate Encounter   Received notification from CoverMyMeds that prior authorization for Wegovy  1.7mg /0.45ml is required/requested.   Insurance verification completed.   The patient is insured through [employer].   Per test claim: PA required; PA submitted to above mentioned insurance via Latent Key/confirmation #/EOC [id number] Status is pending

## 2024-10-25 NOTE — Telephone Encounter (Signed)
 Pharmacy Patient Advocate Encounter  Received notification from [facility] that Prior Authorization for Wegovy  1.7mg /0.52ml has been APPROVED from 10/25/24 to 10/22/25   PA #/Case ID/Reference #: [id number]

## 2024-12-12 ENCOUNTER — Ambulatory Visit: Admitting: Medical-Surgical

## 2025-01-18 ENCOUNTER — Ambulatory Visit: Admitting: Medical-Surgical
